# Patient Record
Sex: Male | Born: 1950
Health system: Southern US, Community
[De-identification: ages and names within clinical notes are randomized; demographics above are authoritative.]

## PROBLEM LIST (undated history)

## (undated) DIAGNOSIS — G4730 Sleep apnea, unspecified: Secondary | ICD-10-CM

## (undated) DIAGNOSIS — R001 Bradycardia, unspecified: Secondary | ICD-10-CM

## (undated) DIAGNOSIS — E785 Hyperlipidemia, unspecified: Secondary | ICD-10-CM

## (undated) DIAGNOSIS — I219 Acute myocardial infarction, unspecified: Secondary | ICD-10-CM

## (undated) DIAGNOSIS — Z720 Tobacco use: Secondary | ICD-10-CM

## (undated) DIAGNOSIS — I2510 Atherosclerotic heart disease of native coronary artery without angina pectoris: Secondary | ICD-10-CM

## (undated) DIAGNOSIS — I10 Essential (primary) hypertension: Secondary | ICD-10-CM

## (undated) DIAGNOSIS — I2111 ST elevation (STEMI) myocardial infarction involving right coronary artery: Secondary | ICD-10-CM

## (undated) DIAGNOSIS — M1990 Unspecified osteoarthritis, unspecified site: Secondary | ICD-10-CM

## (undated) DIAGNOSIS — I77810 Thoracic aortic ectasia: Secondary | ICD-10-CM

## (undated) HISTORY — PX: TONSILLECTOMY: SUR1361

## (undated) HISTORY — DX: Hyperlipidemia, unspecified: E78.5

## (undated) HISTORY — DX: Atherosclerotic heart disease of native coronary artery without angina pectoris: I25.10

## (undated) HISTORY — DX: Thoracic aortic ectasia: I77.810

## (undated) HISTORY — DX: Acute myocardial infarction, unspecified: I21.9

## (undated) HISTORY — DX: Unspecified osteoarthritis, unspecified site: M19.90

## (undated) HISTORY — DX: Bradycardia, unspecified: R00.1

---

## 1978-09-26 HISTORY — PX: OTHER SURGICAL HISTORY: SHX169

## 1981-01-29 ENCOUNTER — Encounter: Payer: Self-pay | Admitting: Gastroenterology

## 1981-01-30 ENCOUNTER — Encounter: Payer: Self-pay | Admitting: Gastroenterology

## 2009-08-12 ENCOUNTER — Encounter: Payer: Self-pay | Admitting: Internal Medicine

## 2009-11-02 ENCOUNTER — Ambulatory Visit: Payer: Self-pay | Admitting: Internal Medicine

## 2009-11-02 DIAGNOSIS — B351 Tinea unguium: Secondary | ICD-10-CM | POA: Insufficient documentation

## 2009-11-02 DIAGNOSIS — F528 Other sexual dysfunction not due to a substance or known physiological condition: Secondary | ICD-10-CM

## 2009-11-02 DIAGNOSIS — Z860100 Personal history of colon polyps, unspecified: Secondary | ICD-10-CM | POA: Insufficient documentation

## 2009-11-02 DIAGNOSIS — E039 Hypothyroidism, unspecified: Secondary | ICD-10-CM

## 2009-11-02 LAB — CONVERTED CEMR LAB
ALT: 20 units/L (ref 0–53)
AST: 20 units/L (ref 0–37)
Albumin: 4.1 g/dL (ref 3.5–5.2)
Alkaline Phosphatase: 68 units/L (ref 39–117)
Basophils Relative: 0.3 % (ref 0.0–3.0)
Bilirubin, Direct: 0.3 mg/dL (ref 0.0–0.3)
CO2: 33 meq/L — ABNORMAL HIGH (ref 19–32)
Calcium: 9 mg/dL (ref 8.4–10.5)
Chloride: 105 meq/L (ref 96–112)
Eosinophils Absolute: 0.1 10*3/uL (ref 0.0–0.7)
Eosinophils Relative: 1.4 % (ref 0.0–5.0)
Hemoglobin: 15.9 g/dL (ref 13.0–17.0)
Lymphocytes Relative: 34.7 % (ref 12.0–46.0)
MCHC: 33.8 g/dL (ref 30.0–36.0)
Neutro Abs: 4.1 10*3/uL (ref 1.4–7.7)
Neutrophils Relative %: 54.8 % (ref 43.0–77.0)
RBC: 5 M/uL (ref 4.22–5.81)
Sodium: 144 meq/L (ref 135–145)
Total Protein: 7.2 g/dL (ref 6.0–8.3)
WBC: 7.5 10*3/uL (ref 4.5–10.5)

## 2009-11-03 ENCOUNTER — Encounter: Payer: Self-pay | Admitting: Internal Medicine

## 2009-11-09 ENCOUNTER — Telehealth: Payer: Self-pay | Admitting: Internal Medicine

## 2009-11-23 ENCOUNTER — Encounter (INDEPENDENT_AMBULATORY_CARE_PROVIDER_SITE_OTHER): Payer: Self-pay | Admitting: *Deleted

## 2009-11-30 ENCOUNTER — Encounter (INDEPENDENT_AMBULATORY_CARE_PROVIDER_SITE_OTHER): Payer: Self-pay | Admitting: *Deleted

## 2009-12-21 ENCOUNTER — Encounter: Payer: Self-pay | Admitting: Internal Medicine

## 2010-01-29 ENCOUNTER — Encounter (INDEPENDENT_AMBULATORY_CARE_PROVIDER_SITE_OTHER): Payer: Self-pay | Admitting: *Deleted

## 2010-03-15 ENCOUNTER — Ambulatory Visit: Payer: Self-pay | Admitting: Gastroenterology

## 2010-04-05 ENCOUNTER — Encounter (INDEPENDENT_AMBULATORY_CARE_PROVIDER_SITE_OTHER): Payer: Self-pay | Admitting: *Deleted

## 2010-05-04 ENCOUNTER — Encounter (INDEPENDENT_AMBULATORY_CARE_PROVIDER_SITE_OTHER): Payer: Self-pay | Admitting: *Deleted

## 2010-05-05 ENCOUNTER — Ambulatory Visit: Payer: Self-pay | Admitting: Gastroenterology

## 2010-05-24 ENCOUNTER — Ambulatory Visit: Payer: Self-pay | Admitting: Gastroenterology

## 2010-05-26 ENCOUNTER — Encounter: Payer: Self-pay | Admitting: Gastroenterology

## 2010-10-26 NOTE — Letter (Signed)
Summary: Hx & Physical/[facility]  Hx & Physical/[facility]   Imported By: Sherian Rein 05/10/2010 08:01:24  _____________________________________________________________________  External Attachment:    Type:   Image     Comment:   External Document

## 2010-10-26 NOTE — Assessment & Plan Note (Signed)
Summary: discuss having colonoscopy--ch.   History of Present Illness Visit Type: Initial Visit Primary GI MD: Sheryn Bison MD FACP FAGA Primary Provider: Etta Grandchild MD Chief Complaint: Colon screening age; pateint not having any problems History of Present Illness:   60 year old Caucasian male with a history of colon polyps removed in [place] 7-8 years ago. He currently is asymptomatic and only has occasional bright red blood if he strains at a bowel movement. He has regular BMs and denies abdominal pain, upper GI, or hepatobiliary complaints. Appetite is good- weight is stable.Family history is unremarkable except for pancreatic cancer in a sister at age 68. Recent labs were reviewed and were unremarkable. His medical physician is Dr. Sanda Linger.   GI Review of Systems      Denies abdominal pain, acid reflux, belching, bloating, chest pain, dysphagia with liquids, dysphagia with solids, heartburn, loss of appetite, nausea, vomiting, vomiting blood, weight loss, and  weight gain.        Denies anal fissure, black tarry stools, change in bowel habit, constipation, diarrhea, diverticulosis, fecal incontinence, heme positive stool, hemorrhoids, irritable bowel syndrome, jaundice, light color stool, liver problems, rectal bleeding, and  rectal pain.    Current Medications (verified): 1)  Lamisil 250 Mg Tabs (Terbinafine Hcl) .... Once Daily 2)  Cialis 20 Mg Tabs (Tadalafil) .... Take As Directed  Allergies (verified): No Known Drug Allergies  Past History:  Past medical, surgical, family and social histories (including risk factors) reviewed for relevance to current acute and chronic problems.  Past Medical History: Unremarkable Colonic polyps, hx of Arthritis Sleep Apnea  Past Surgical History: Tonsillectomy patotid glandremoval  Family History: Reviewed history from 11/02/2009 and no changes required. Family History Diabetes 1st degree relative Family History of  Pancreatic Cancer:Sister  Social History: Reviewed history from 11/02/2009 and no changes required. Occupation: Environmental health practitioner Married Alcohol use-yes Drug use-no Regular exercise-yes  Review of Systems  The patient denies allergy/sinus, anemia, anxiety-new, arthritis/joint pain, back pain, blood in urine, breast changes/lumps, change in vision, confusion, cough, coughing up blood, depression-new, fainting, fatigue, fever, headaches-new, hearing problems, heart murmur, heart rhythm changes, itching, menstrual pain, muscle pains/cramps, night sweats, nosebleeds, pregnancy symptoms, shortness of breath, skin rash, sleeping problems, sore throat, swelling of feet/legs, swollen lymph glands, thirst - excessive , urination - excessive , urination changes/pain, urine leakage, vision changes, and voice change.    Vital Signs:  Patient profile:   60 year old male Height:      70 inches Weight:      217.50 pounds BMI:     31.32 Pulse rate:   80 / minute Pulse rhythm:   regular BP sitting:   120 / 80  (left arm) Cuff size:   regular  Vitals Entered By: June McMurray CMA Duncan Dull) (March 15, 2010 8:34 AM)  Physical Exam  General:  Well developed, well nourished, no acute distress.healthy appearing.   Head:  Normocephalic and atraumatic. Eyes:  PERRLA, no icterus. Neck:  Supple; no masses or thyromegaly. Lungs:  Clear throughout to auscultation.decreased BS on L and decreased BS on R.   Heart:  Regular rate and rhythm; no murmurs, rubs,  or bruits. Abdomen:  Soft, nontender and nondistended. No masses, hepatosplenomegaly or hernias noted. Normal bowel sounds. Rectal:  deferred until time of colonoscopy.   Msk:  Symmetrical with no gross deformities. Normal posture. Extremities:  No clubbing, cyanosis, edema or deformities noted.Scarring noted on the anterior shin surfaces bilaterally from chemical injury. Neurologic:  Alert  and  oriented x4;  grossly normal neurologically. Cervical  Nodes:  No significant cervical adenopathy. Psych:  Alert and cooperative. Normal mood and affect.   Impression & Recommendations:  Problem # 1:  COLONIC POLYPS, HX OF (ICD-V12.72) Assessment Unchanged Colonoscopy scheduled at his convenience with conventional balanced electrolyte solution preparation. Negative family history except for pancreatic carcinoma in his sister. However, family history is somewhat equivocal.  Problem # 2:  ONYCHOMYCOSIS, TOENAILS (ICD-110.1) Assessment: Comment Only  Patient Instructions: 1)  Please call back to schedule a colonoscopy at your convience. 2)  Please continue current medications.  3)  The medication list was reviewed and reconciled.  All changed / newly prescribed medications were explained.  A complete medication list was provided to the patient / caregiver. 4)  Colonoscopy and Flexible Sigmoidoscopy brochure given.  5)  Conscious Sedation brochure given.  6)  Copy sent to : Dr. Sanda Linger.

## 2010-10-26 NOTE — Letter (Signed)
Summary: Patient Notice- Polyp Results  [facility]  [address] [place], [place] [address]   Phone: [phone]  Fax: [phone]        May 26, 2010 MRN: [id number]    Gregory Hale [address] [place], [place]  [address]    Dear [name],  I am pleased to inform you that the colon polyp(s) removed during your recent colonoscopy was (were) found to be benign (no cancer detected) upon pathologic examination.  I recommend you have a repeat colonoscopy examination in 10_ years to look for recurrent polyps, as having colon polyps increases your risk for having recurrent polyps or even colon cancer in the future.  Should you develop new or worsening symptoms of abdominal pain, bowel habit changes or bleeding from the rectum or bowels, please schedule an evaluation with either your primary care physician or with me.  Additional information/recommendations:  _X_ No further action with gastroenterology is needed at this time. Please      follow-up with your primary care physician for your other healthcare      needs.  __ Please call [phone] to schedule a return visit to review your      situation.  __ Please keep your follow-up visit as already scheduled.  __ Continue treatment plan as outlined the day of your exam.  Please call us if you are having persistent problems or have questions about your condition that have not been fully answered at this time.  Sincerely,  Mardella Layman MD [facility]  This letter has been electronically signed by your physician.  Appended Document: Patient Notice- Polyp Results letter mailed 9.2.11

## 2010-10-26 NOTE — Progress Notes (Signed)
Summary: Thyroid  Phone Note Call from Patient Call back at Home Phone [phone]   Summary of Call: Pt was started on thyroid medication but labs came back normal. Should he continue medication?  Initial call taken by: Lamar Sprinkles, CMA,  November 09, 2009 10:30 AM  Follow-up for Phone Call        no, he does not need it Follow-up by: Etta Grandchild MD,  November 10, 2009 7:42 AM  Additional Follow-up for Phone Call Additional follow up Details #1::        Patient notified and states that Cialis is not doing everything he needs it to and would like to know what to do next/what do you recommend? (he was under the impression that this was side effect of thyroid issue) Please advise. Additional Follow-up by: Lucious Groves,  November 10, 2009 9:46 AM    Additional Follow-up for Phone Call Additional follow up Details #2::    i will refer him to urology Follow-up by: Etta Grandchild MD,  November 10, 2009 9:55 AM  Additional Follow-up for Phone Call Additional follow up Details #3:: Details for Additional Follow-up Action Taken: Patient notified and will await call from [facility]. Additional Follow-up by: Lucious Groves,  November 10, 2009 11:46 AM

## 2010-10-26 NOTE — Assessment & Plan Note (Signed)
Summary: NEW PT,BCBS,#,LB   Vital Signs:  Patient profile:   60 year old male Height:      70 inches Weight:      220 pounds BMI:     31.68 O2 Sat:      98 % on Room air Temp:     97.1 degrees F oral Pulse rate:   60 / minute Pulse rhythm:   regular Resp:     16 per minute BP sitting:   140 / 86  (left arm) Cuff size:   large  Vitals Entered By: Rock Nephew CMA (November 02, 2009 1:21 PM)  Nutrition Counseling: Patient's BMI is greater than 25 and therefore counseled on weight management options.  O2 Flow:  Room air  Primary Care Provider:  Etta Grandchild MD   History of Present Illness: New to me with complaints 1. ED that has not improved much despite trying Cialis and Viagra 2. He had a complete physical on 08/12/09 and TSH was high at 6.03, but this was not treated 3. He has painful toenail fungus on both sides, R>L, and nearly all toes are  involed.  Preventive Screening-Counseling & Management  Alcohol-Tobacco     Alcohol drinks/day: <1     Alcohol type: beer     >5/day in last 3 mos: no     Alcohol Counseling: not indicated; use of alcohol is not excessive or problematic     Feels need to cut down: no     Feels annoyed by complaints: no     Feels guilty re: drinking: no     Needs 'eye opener' in am: no     Smoking Status: quit > 6 months     Year Quit: 1987  Caffeine-Diet-Exercise     Does Patient Exercise: yes  Hep-HIV-STD-Contraception     Hepatitis Risk: no risk noted     HIV Risk: no risk noted     STD Risk: no risk noted  Safety-Violence-Falls     Seat Belt Use: yes     Helmet Use: yes     Firearms in the Home: no firearms in the home     Smoke Detectors: yes     Violence in the Home: no risk noted     Sexual Abuse: no      Sexual History:  currently monogamous.        Drug Use:  no.        Blood Transfusions:  no.    Medications Prior to Update: 1)  None  Current Medications (verified): 1)  None  Allergies (verified): No  Known Drug Allergies  Past History:  Past Medical History: Unremarkable  Past Surgical History: Tonsillectomy  Family History: Family History Diabetes 1st degree relative  Social History: Occupation: Environmental health practitioner Married Alcohol use-yes Drug use-no Regular exercise-yes Smoking Status:  quit > 6 months Hepatitis Risk:  no risk noted HIV Risk:  no risk noted STD Risk:  no risk noted Seat Belt Use:  yes Sexual History:  currently monogamous Blood Transfusions:  no Drug Use:  no Does Patient Exercise:  yes  Review of Systems       The patient complains of weight gain.  The patient denies anorexia, fever, weight loss, chest pain, syncope, dyspnea on exertion, peripheral edema, prolonged cough, headaches, hemoptysis, abdominal pain, hematuria, incontinence, suspicious skin lesions, and angioedema.   GI:  Denies bloody stools, change in bowel habits, loss of appetite, nausea, vomiting, and yellowish skin color. GU:  Complains of erectile dysfunction; denies decreased libido, discharge, dysuria, hematuria, incontinence, nocturia, urinary frequency, and urinary hesitancy. Endo:  Complains of weight change; denies cold intolerance, excessive hunger, excessive thirst, excessive urination, heat intolerance, and polyuria.  Physical Exam  General:  alert, well-developed, well-nourished, well-hydrated, appropriate dress, normal appearance, and overweight-appearing.   Head:  normocephalic, atraumatic, no abnormalities observed, and no abnormalities palpated.   Mouth:  Oral mucosa and oropharynx without lesions or exudates.  Teeth in good repair. Neck:  supple, full ROM, no masses, no thyromegaly, no thyroid nodules or tenderness, no JVD, normal carotid upstroke, no carotid bruits, no cervical lymphadenopathy, and no neck tenderness.   Lungs:  Normal respiratory effort, chest expands symmetrically. Lungs are clear to auscultation, no crackles or wheezes. Heart:  Normal rate and  regular rhythm. S1 and S2 normal without gallop, murmur, click, rub or other extra sounds. Abdomen:  soft, non-tender, normal bowel sounds, no distention, no masses, no guarding, no rigidity, no hepatomegaly, and no splenomegaly.   Msk:  No deformity or scoliosis noted of thoracic or lumbar spine.   Pulses:  R and L carotid,radial,femoral,dorsalis pedis and posterior tibial pulses are full and equal bilaterally Extremities:  No clubbing, cyanosis, edema, or deformity noted with normal full range of motion of all joints.   Neurologic:  No cranial nerve deficits noted. Station and gait are normal. Plantar reflexes are down-going bilaterally. DTRs are symmetrical throughout. Sensory, motor and coordinative functions appear intact. Skin:  Intact without suspicious lesions or rashes. Toenails are deformed with subung. debris, thickening, and lysis, all are involved to some degree Cervical Nodes:  no anterior cervical adenopathy and no posterior cervical adenopathy.   Axillary Nodes:  no R axillary adenopathy and no L axillary adenopathy.   Psych:  Cognition and judgment appear intact. Alert and cooperative with normal attention span and concentration. No apparent delusions, illusions, hallucinations   Impression & Recommendations:  Problem # 1:  ERECTILE DYSFUNCTION (ICD-302.72) Assessment New  I am concerned that Hypothyroidism and/or low testosterone may be the culprit here His updated medication list for this problem includes:    Cialis 20 Mg Tabs (Tadalafil) .Marland Kitchen... Take as directed  Orders: TLB-BMP (Basic Metabolic Panel-BMET) (80048-METABOL) TLB-CBC Platelet - w/Differential (85025-CBCD) TLB-Hepatic/Liver Function Pnl (80076-HEPATIC) TLB-TSH (Thyroid Stimulating Hormone) (84443-TSH) TLB-Testosterone, Total (84403-TESTO)  Discussed proper use of medications, as well as side effects.   Problem # 2:  UNSPECIFIED HYPOTHYROIDISM (ICD-244.9) Assessment: New  His updated medication list for  this problem includes:    Synthroid 50 Mcg Tabs (Levothyroxine sodium) ..... One by mouth once daily for thyroid  Orders: TLB-BMP (Basic Metabolic Panel-BMET) (80048-METABOL) TLB-CBC Platelet - w/Differential (85025-CBCD) TLB-Hepatic/Liver Function Pnl (80076-HEPATIC) TLB-TSH (Thyroid Stimulating Hormone) (84443-TSH) TLB-Testosterone, Total (84403-TESTO)  Problem # 3:  ONYCHOMYCOSIS, TOENAILS (ICD-110.1) Assessment: New  His updated medication list for this problem includes:    Lamisil 250 Mg Tabs (Terbinafine hcl) ..... Once daily  Orders: TLB-BMP (Basic Metabolic Panel-BMET) (80048-METABOL) TLB-CBC Platelet - w/Differential (85025-CBCD) TLB-Hepatic/Liver Function Pnl (80076-HEPATIC) TLB-TSH (Thyroid Stimulating Hormone) (84443-TSH) TLB-Testosterone, Total (84403-TESTO)  Discussed nail care and medication treatment options.   Complete Medication List: 1)  Lamisil 250 Mg Tabs (Terbinafine hcl) .... Once daily 2)  Cialis 20 Mg Tabs (Tadalafil) .... Take as directed 3)  Synthroid 50 Mcg Tabs (Levothyroxine sodium) .... One by mouth once daily for thyroid  Patient Instructions: 1)  Please schedule a follow-up appointment in 1 month. 2)  It is important that you exercise regularly at least 20  minutes 5 times a week. If you develop chest pain, have severe difficulty breathing, or feel very tired , stop exercising immediately and seek medical attention. 3)  You need to lose weight. Consider a lower calorie diet and regular exercise.  Prescriptions: SYNTHROID 50 MCG TABS (LEVOTHYROXINE SODIUM) One by mouth once daily for thyroid  #30 x 11   Entered and Authorized by:   Etta Grandchild MD   Signed by:   Etta Grandchild MD on 11/02/2009   Method used:   Print then Give to Patient   RxID:   [id number] CIALIS 20 MG TABS (TADALAFIL) take as directed  #3 x 11   Entered and Authorized by:   Etta Grandchild MD   Signed by:   Etta Grandchild MD on 11/02/2009   Method used:   Print  then Give to Patient   RxID:   [id number] LAMISIL 250 MG TABS (TERBINAFINE HCL) once daily  #30 x 4   Entered and Authorized by:   Etta Grandchild MD   Signed by:   Etta Grandchild MD on 11/02/2009   Method used:   Print then Give to Patient   RxID:   [id number]   Preventive Care Screening  Last Tetanus Booster:    Date:  09/26/2008    Results:  Historical     Not Administered:    Influenza Vaccine not given due to: declined   Appended Document: NEW PT,BCBS,#,LB    Past History:  Past Medical History: Unremarkable Colonic polyps, hx of    Colorectal Screening:  Colonoscopy Results:    Date of Exam: 12/03/2003    Results: Adenomatous Polyp  Colonoscopy Comments:    Dr Dawna Part in [place] ph [phone] fax [phone]  Immunization & Chemoprophylaxis:    Tetanus vaccine: Historical  (09/26/2008)

## 2010-10-26 NOTE — Miscellaneous (Signed)
Summary: [facility]  Clinical Lists Changes  Medications: Added new medication of MOVIPREP 100 GM  SOLR (PEG-KCL-NACL-NASULF-NA ASC-C) As per prep instructions. - Signed Rx of MOVIPREP 100 GM  SOLR (PEG-KCL-NACL-NASULF-NA ASC-C) As per prep instructions.;  #1 x 0;  Signed;  Entered by: Ezra Sites RN;  Authorized by: Mardella Layman MD [facility];  Method used: [facility] [phone]*, [address], [place], [place]  [address], Ph: [phone] or [phone], Fax: [phone] Observations: Added new observation of NKA: T (05/05/2010 13:18)    Prescriptions: MOVIPREP 100 GM  SOLR (PEG-KCL-NACL-NASULF-NA ASC-C) As per prep instructions.  #1 x 0   Entered by:   Ezra Sites RN   Authorized by:   Mardella Layman MD [facility]   Signed by:   Ezra Sites RN on 05/05/2010   Method used:   [facility]        [facility] [phone][facility] (retail)       [address]       [place], [place]  [address]       Ph: [phone] or [phone]       Fax: [phone]   RxID:   [id number]

## 2010-10-26 NOTE — Op Note (Signed)
Summary: [facility] [place]   Imported By: Sherian Rein 05/10/2010 07:59:59  _____________________________________________________________________  External Attachment:    Type:   Image     Comment:   External Document

## 2010-10-26 NOTE — Letter (Signed)
Summary: [facility] Instructions  [facility]  [address] [place], [place] [address]   Phone: [phone]  Fax: [phone]       Gregory Hale    09-Dec-1950    MRN: [id number]        Procedure Day Dorna Bloom:  Duanne Limerick  05/24/10     Arrival Time:  7:30am     Procedure Time: 8:30am     Location of Procedure:                    Juliann Pares  [facility] (4th Floor)                        PREPARATION FOR COLONOSCOPY WITH MOVIPREP   Starting 5 days prior to your procedure  [facility] 08/24  do not eat nuts, seeds, popcorn, corn, beans, peas,  salads, or any raw vegetables.  Do not take any fiber supplements (e.g. Metamucil, Citrucel, and Benefiber).  THE DAY BEFORE YOUR PROCEDURE         DATE:  SUNDAY  08/28  1.  Drink clear liquids the entire day-NO SOLID FOOD  2.  Do not drink anything colored red or purple.  Avoid juices with pulp.  No orange juice.  3.  Drink at least 64 oz. (8 glasses) of fluid/clear liquids during the day to prevent dehydration and help the prep work efficiently.  CLEAR LIQUIDS INCLUDE: Water Jello Ice Popsicles Tea (sugar ok, no milk/cream) Powdered fruit flavored drinks Coffee (sugar ok, no milk/cream) Gatorade Juice: apple, white grape, white cranberry  Lemonade Clear bullion, consomm, broth Carbonated beverages (any kind) Strained chicken noodle soup Hard Candy                             4.  In the morning, mix first dose of MoviPrep solution:    Empty 1 Pouch A and 1 Pouch B into the disposable container    Add lukewarm drinking water to the top line of the container. Mix to dissolve    Refrigerate (mixed solution should be used within 24 hrs)  5.  Begin drinking the prep at 5:00 p.m. The MoviPrep container is divided by 4 marks.   Every 15 minutes drink the solution down to the next mark (approximately 8 oz) until the full liter is complete.   6.  Follow completed prep with 16 oz of clear liquid of your choice  (Nothing red or purple).  Continue to drink clear liquids until bedtime.  7.  Before going to bed, mix second dose of MoviPrep solution:    Empty 1 Pouch A and 1 Pouch B into the disposable container    Add lukewarm drinking water to the top line of the container. Mix to dissolve    Refrigerate  THE DAY OF YOUR PROCEDURE      DATE:  MONDAY  08/29  Beginning at  3:30 a.m. (5 hours before procedure):         1. Every 15 minutes, drink the solution down to the next mark (approx 8 oz) until the full liter is complete.  2. Follow completed prep with 16 oz. of clear liquid of your choice.    3. You may drink clear liquids until 6:30am  (2 HOURS BEFORE PROCEDURE).   MEDICATION INSTRUCTIONS  Unless otherwise instructed, you should take regular prescription medications with a small sip of water   as  early as possible the morning of your procedure.           OTHER INSTRUCTIONS  You will need a responsible adult at least 59 years of age to accompany you and drive you home.   This person must remain in the waiting room during your procedure.  Wear loose fitting clothing that is easily removed.  Leave jewelry and other valuables at home.  However, you may wish to bring a book to read or  an iPod/MP3 player to listen to music as you wait for your procedure to start.  Remove all body piercing jewelry and leave at home.  Total time from sign-in until discharge is approximately 2-3 hours.  You should go home directly after your procedure and rest.  You can resume normal activities the  day after your procedure.  The day of your procedure you should not:   Drive   Make legal decisions   Operate machinery   Drink alcohol   Return to work  You will receive specific instructions about eating, activities and medications before you leave.    The above instructions have been reviewed and explained to me by   Ezra Sites RN  May 05, 2010 1:37 PM    I fully understand  and can verbalize these instructions _____________________________ Date _________

## 2010-10-26 NOTE — Letter (Signed)
Summary: New Patient letter  [facility]  [address] [place], [place] [address]   Phone: [phone]  Fax: [phone]       11/30/2009 MRN: [id number]  Gregory Hale [address] [place], [place]  [address]  Dear Mr. Gregory Hale,  Welcome to the Gastroenterology Division at [facility].    You are scheduled to see Dr. Jarold Motto on 12-25-09 at 10:00a.m. on the 3rd floor at [facility], [address].  We ask that you try to arrive at our office 15 minutes prior to your appointment time to allow for check-in.  We would like you to complete the enclosed self-administered evaluation form prior to your visit and bring it with you on the day of your appointment.  We will review it with you.  Also, please bring a complete list of all your medications or, if you prefer, bring the medication bottles and we will list them.  Please bring your insurance card so that we may make a copy of it.  If your insurance requires a referral to see a specialist, please bring your referral form from your primary care physician.  Co-payments are due at the time of your visit and may be paid by cash, check or credit card.     Your office visit will consist of a consult with your physician (includes a physical exam), any laboratory testing he/she may order, scheduling of any necessary diagnostic testing (e.g. x-ray, ultrasound, CT-scan), and scheduling of a procedure (e.g. Endoscopy, Colonoscopy) if required.  Please allow enough time on your schedule to allow for any/all of these possibilities.    If you cannot keep your appointment, please call [phone] to cancel or reschedule prior to your appointment date.  This allows [place] the opportunity to schedule an appointment for another patient in need of care.  If you do not cancel or reschedule by 5 p.m. the business day prior to your appointment date, you will be charged a $50.00 late cancellation/no-show fee.    Thank you for  choosing [facility] for your medical needs.  We appreciate the opportunity to care for you.  Please visit [place] at our website  to learn more about our practice.                     Sincerely,                                                             The Gastroenterology Division

## 2010-10-26 NOTE — Letter (Signed)
Summary: Previsit letter  [facility]  [address] [place], [place] [address]   Phone: [phone]  Fax: [phone]       04/05/2010 MRN: [id number]  Gregory Hale [address] [place], [place]  [address]  Dear Mr. DROKE,  Welcome to the Gastroenterology Division at [facility].    You are scheduled to see a nurse for your pre-procedure visit on 05-10-10 at 8:30am on the 3rd floor at [facility], [address].  We ask that you try to arrive at our office 15 minutes prior to your appointment time to allow for check-in.  Your nurse visit will consist of discussing your medical and surgical history, your immediate family medical history, and your medications.    Please bring a complete list of all your medications or, if you prefer, bring the medication bottles and we will list them.  We will need to be aware of both prescribed and over the counter drugs.  We will need to know exact dosage information as well.  If you are on blood thinners (Coumadin, Plavix, Aggrenox, Ticlid, etc.) please call our office today/prior to your appointment, as we need to consult with your physician about holding your medication.   Please be prepared to read and sign documents such as consent forms, a financial agreement, and acknowledgement forms.  If necessary, and with your consent, a friend or relative is welcome to sit-in on the nurse visit with you.  Please bring your insurance card so that we may make a copy of it.  If your insurance requires a referral to see a specialist, please bring your referral form from your primary care physician.  No co-pay is required for this nurse visit.     If you cannot keep your appointment, please call [phone] to cancel or reschedule prior to your appointment date.  This allows [place] the opportunity to schedule an appointment for another patient in need of care.    Thank you for choosing [facility] for your medical  needs.  We appreciate the opportunity to care for you.  Please visit [place] at our website  to learn more about our practice.                     Sincerely.                                                                                                                   The Gastroenterology Division

## 2010-10-26 NOTE — Letter (Signed)
Summary: [facility]  [facility]   Imported By: Lester Deepwater 12/28/2009 07:58:40  _____________________________________________________________________  External Attachment:    Type:   Image     Comment:   External Document

## 2010-10-26 NOTE — Procedures (Signed)
Summary: Colonoscopy  Patient: Gregory Hale Note: All result statuses are Final unless otherwise noted.  Tests: (1) Colonoscopy (COL)   COL Colonoscopy           DONE     [facility]     [address]     [place], [place]  [address]           COLONOSCOPY PROCEDURE REPORT           PATIENT:  Gregory Hale, Gregory Hale  MR#:  [id number]     BIRTHDATE:  02/02/1951, 59 yrs. old  GENDER:  male     ENDOSCOPIST:  Vania Rea. Jarold Motto, MD, [facility]     REF. BY:     PROCEDURE DATE:  05/24/2010     PROCEDURE:  Colonoscopy with biopsy     ASA CLASS:  Class II     INDICATIONS:  history of polyps     MEDICATIONS:   Fentanyl 50 mcg IV, Versed 5 mg IV           DESCRIPTION OF PROCEDURE:   After the risks benefits and     alternatives of the procedure were thoroughly explained, informed     consent was obtained.  Digital rectal exam was performed and     revealed no abnormalities.   The LB CF-[id number] endoscope     was introduced through the anus and advanced to the cecum, which     was identified by both the appendix and ileocecal valve, without     limitations.  The quality of the prep was excellent, using     MoviPrep.  The instrument was then slowly withdrawn as the colon     was fully examined.     <<PROCEDUREIMAGES>>           FINDINGS:  ENDOSCOPIC FINDINGS:  There were multiple polyps     identified and removed. in the rectum and sigmoid colon. -2 mm     flat nodules cold BX. REMOVED. This was otherwise a normal     examination of the colon.   Retroflexed views in the rectum     revealed hypertrophied anal papillae.    The scope was then     withdrawn from the patient and the procedure completed.           COMPLICATIONS:  None     ENDOSCOPIC IMPRESSION:     1) Polyps, multiple in the rectum and sigmoid colon     2) Otherwise normal examination     3) Hypertrophied anal papillae     PROBAQBLE HYPERPLASTIC POLYPS.     RECOMMENDATIONS:     1) Repeat colonoscopy in 5 years if polyp  adenomatous; otherwise     10 years     REPEAT EXAM:  No           ______________________________     Vania Rea. Jarold Motto, MD, [facility] Graham           CC:  Etta Grandchild, MD           n.     Rosalie DoctorMarland Kitchen   Vania Rea. Patterson at 05/24/2010 08:57 AM           Harlan Stains, [id number]  Note: An exclamation mark (!) indicates a result that was not dispersed into the flowsheet. Document Creation Date: 05/24/2010 8:59 AM _______________________________________________________________________  (1) Order result status: Final Collection or observation date-time: 05/24/2010 08:49 Requested date-time:  Receipt date-time:  Reported date-time:  Referring Physician:  Ordering Physician: Sheryn Bison [phone]) Specimen Source:  Source: Launa Grill Order Number: [id number] Lab site:   Appended Document: Colonoscopy     Procedures Next Due Date:    Colonoscopy: 05/2020

## 2010-10-26 NOTE — Letter (Signed)
Summary: [facility] Consult Scheduled Letter  [facility]  [address] [place], [place] [address]   Phone: [phone]  Fax: [phone]      11/23/2009 MRN: [id number]  WARD BOISSONNEAULT [address] [place], [place]  [address]    Dear Mr. BREEZE,      We have scheduled an appointment for you.  At the recommendation of Dr.Jones, we have scheduled you a consult with Dr Halina Andreas on 12/15/09 at 8:30am.  Their phone number is [phone].  If this appointment day and time is not convenient for you, please feel free to call the office of the doctor you are being referred to at the number listed above and reschedule the appointment.    [facility] [address] [place], [place] [address]    Thank you,  Patient Care Coordinator [facility]

## 2010-10-26 NOTE — Letter (Signed)
Summary: Results Follow-up Letter  [facility]  [address] [place], [place] [address]   Phone: [phone]  Fax: [phone]    11/03/2009  [address] [place], [place]  [address]  Dear Gregory Hale,   The following are the results of your recent test(s):  Test     Result     Testosterone   normal Thyroid     normal Liver/kidney   normal CBC       normal   _________________________________________________________  Please call for an appointment in 1-2 months _________________________________________________________ _________________________________________________________ _________________________________________________________  Sincerely,  Sanda Linger MD [facility]

## 2010-10-26 NOTE — Letter (Signed)
Summary: New Patient letter  [facility]  [address] [place], [place] [address]   Phone: [phone]  Fax: [phone]       01/29/2010 MRN: [id number]  Gregory Hale [address] [place], [place]  [address]  Dear [name],  Welcome to the Gastroenterology Division at [facility].    You are scheduled to see Dr.  Jarold Motto on 03-15-10 at 8:30a.m. on the 3rd floor at [facility], [address].  We ask that you try to arrive at our office 15 minutes prior to your appointment time to allow for check-in.  We would like you to complete the enclosed self-administered evaluation form prior to your visit and bring it with you on the day of your appointment.  We will review it with you.  Also, please bring a complete list of all your medications or, if you prefer, bring the medication bottles and we will list them.  Please bring your insurance card so that we may make a copy of it.  If your insurance requires a referral to see a specialist, please bring your referral form from your primary care physician.  Co-payments are due at the time of your visit and may be paid by cash, check or credit card.     Your office visit will consist of a consult with your physician (includes a physical exam), any laboratory testing he/she may order, scheduling of any necessary diagnostic testing (e.g. x-ray, ultrasound, CT-scan), and scheduling of a procedure (e.g. Endoscopy, Colonoscopy) if required.  Please allow enough time on your schedule to allow for any/all of these possibilities.    If you cannot keep your appointment, please call [phone] to cancel or reschedule prior to your appointment date.  This allows [place] the opportunity to schedule an appointment for another patient in need of care.  If you do not cancel or reschedule by 5 p.m. the business day prior to your appointment date, you will be charged a $50.00 late cancellation/no-show fee.    Thank you for  choosing [facility] for your medical needs.  We appreciate the opportunity to care for you.  Please visit [place] at our website  to learn more about our practice.                     Sincerely,                                                             The Gastroenterology Division

## 2012-06-14 ENCOUNTER — Encounter: Payer: Self-pay | Admitting: Internal Medicine

## 2012-06-14 LAB — COMPLETE METABOLIC PANEL WITH GFR
AST: 17 U/L
Albumin/Globulin Ratio: 1.8
Alkaline Phosphatase: 64 U/L
BUN: 19 mg/dL (ref 4–21)
CRP: 1.1 mg/dL
Calcium: 9.5 mg/dL
Chloride: 103 mmol/L
GGT: 26
Glucose: 106
HCT: 46 %
HDL: 54 mg/dL (ref 35–70)
Hgb A1c MFr Bld: 5.4 % (ref 4.0–6.0)
Iron: 131
LDH (Lactate Dehydrogenase)-Urine: 134
MCV: 92 fL (ref 80–98)
PSA: 0.4
Phosphorus: 3 mg/dL (ref 2.5–4.9)
Platelets: 154
Potassium: 5.2 mmol/L
RBC: 5.04
RDW: 13.4
Sodium: 142 mmol/L (ref 137–147)
TSH: 4.35
Total CHOL/HDL Ratio: 3.3
Total Protein: 6.5 g/dL
WBC: 6.1

## 2013-03-15 ENCOUNTER — Encounter: Payer: Self-pay | Admitting: Internal Medicine

## 2013-04-05 ENCOUNTER — Encounter: Payer: Self-pay | Admitting: Internal Medicine

## 2013-04-19 ENCOUNTER — Ambulatory Visit (INDEPENDENT_AMBULATORY_CARE_PROVIDER_SITE_OTHER): Payer: Self-pay | Admitting: Internal Medicine

## 2013-04-19 ENCOUNTER — Encounter: Payer: Self-pay | Admitting: Internal Medicine

## 2013-04-19 VITALS — BP 150/90 | HR 63 | Temp 98.7°F | Resp 20 | Ht 70.0 in | Wt 210.0 lb

## 2013-04-19 DIAGNOSIS — Z860100 Personal history of colon polyps, unspecified: Secondary | ICD-10-CM

## 2013-04-19 DIAGNOSIS — E039 Hypothyroidism, unspecified: Secondary | ICD-10-CM

## 2013-04-19 DIAGNOSIS — Z0000 Encounter for general adult medical examination without abnormal findings: Secondary | ICD-10-CM

## 2013-04-19 DIAGNOSIS — H9193 Unspecified hearing loss, bilateral: Secondary | ICD-10-CM

## 2013-04-19 DIAGNOSIS — H9190 Unspecified hearing loss, unspecified ear: Secondary | ICD-10-CM

## 2013-04-19 MED ORDER — SILDENAFIL CITRATE 100 MG PO TABS: 100.0000 mg | ORAL_TABLET | Freq: Every day | ORAL | Status: DC | PRN

## 2013-04-19 NOTE — Progress Notes (Signed)
Subjective:    Patient ID: Gregory Hale, male    DOB: 08/16/1951, 62 y.o.   MRN: [id number]  HPI  62 year old patient who is seen today to establish with our practice.  Medical problems include borderline hypertension.  He also has occasional episodes of sciatica with pain lasting a few weeks. Was also told that he has spinal stenosis he does have occasional numbness in the legs with ambulation.  He is quite active the with activities such as golf without difficulties. He walks and carries his own clubs.  He did have a colonoscopy in 2011 pathology revealed hyperplastic polyps  Social history married 48 year old son has been in [place] resident since 2009 relocated from [place]. 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 [employer] of [employer] former smoker but discontinued 20 years ago  Family history father died when the patient was age 62 do to rheumatic heart disease mother died age 50 of unclear cardiac conditions 3 brothers 4 sisters one sister died of pancreatic cancer positive for diabetes and hypertension     Past Medical History  Diagnosis Date  . Arthritis     History   Social History  . Marital Status: Married    Spouse Name: N/A    Number of Children: N/A  . Years of Education: N/A   Occupational History  . Not on file.   Social History Main Topics  . Smoking status: Former Games developer  . Smokeless tobacco: Never Used  . Alcohol Use: 1.2 - 1.8 oz/week    2-3 Cans of beer per week  . Drug Use: No  . Sexually Active: Not on file   Other Topics Concern  . Not on file   Social History Narrative  . No narrative on file    Past Surgical History  Procedure Laterality Date  . Parotid Left 1980  . Tonsillectomy      as a child    No family history on file.  No Known Allergies  No current outpatient prescriptions on file prior to visit.   No current facility-administered medications on file prior to visit.    BP  150/90  Pulse 63  Temp(Src) 98.7 F (37.1 C) (Oral)  Resp 20  Ht 5\' 10"  (1.778 m)  Wt 210 lb (95.255 kg)  BMI 30.13 kg/m2  SpO2 98%      Review of Systems  Constitutional: Negative for fever, chills, activity change, appetite change and fatigue.  HENT: Negative for hearing loss, ear pain, congestion, rhinorrhea, sneezing, mouth sores, trouble swallowing, neck pain, neck stiffness, dental problem, voice change, sinus pressure and tinnitus.   Eyes: Negative for photophobia, pain, redness and visual disturbance.  Respiratory: Negative for apnea, cough, choking, chest tightness, shortness of breath and wheezing.   Cardiovascular: Negative for chest pain, palpitations and leg swelling.  Gastrointestinal: Negative for nausea, vomiting, abdominal pain, diarrhea, constipation, blood in stool, abdominal distention, anal bleeding and rectal pain.  Genitourinary: Negative for dysuria, urgency, frequency, hematuria, flank pain, decreased urine volume, discharge, penile swelling, scrotal swelling, difficulty urinating, genital sores and testicular pain.  Musculoskeletal: Positive for back pain. Negative for myalgias, joint swelling, arthralgias and gait problem.       Rare episodes  of significant sciatica  Skin: Negative for color change, rash and wound.  Neurological: Negative for dizziness, tremors, seizures, syncope, facial asymmetry, speech difficulty, weakness, light-headedness, numbness and headaches.  Hematological: Negative for adenopathy. Does not bruise/bleed easily.  Psychiatric/Behavioral: Negative for suicidal ideas, hallucinations, behavioral  problems, confusion, sleep disturbance, self-injury, dysphoric mood, decreased concentration and agitation. The patient is not nervous/anxious.        Objective:   Physical Exam  Constitutional: He appears well-developed and well-nourished.  160/90  HENT:  Head: Normocephalic and atraumatic.  Right Ear: External ear normal.  Left Ear:  External ear normal.  Nose: Nose normal.  Mouth/Throat: Oropharynx is clear and moist.  Eyes: Conjunctivae and EOM are normal. Pupils are equal, round, and reactive to light. No scleral icterus.  Neck: Normal range of motion. Neck supple. No JVD present. No thyromegaly present.  Cardiovascular: Regular rhythm, normal heart sounds and intact distal pulses.  Exam reveals no gallop and no friction rub.   No murmur heard. Pulmonary/Chest: Effort normal and breath sounds normal. He exhibits no tenderness.  Abdominal: Soft. Bowel sounds are normal. He exhibits no distension and no mass. There is no tenderness.  Genitourinary: Prostate normal and penis normal. Guaiac negative stool.  Few external hemorrhoids  Musculoskeletal: Normal range of motion. He exhibits no edema and no tenderness.  Lymphadenopathy:    He has no cervical adenopathy.  Neurological: He is alert. He has normal reflexes. No cranial nerve deficit. Coordination normal.  Skin: Skin is warm and dry. No rash noted.  Psychiatric: He has a normal mood and affect. His behavior is normal.          Assessment & Plan:   Preventive health exam Rule out hypertension History of hyperplastic polyps History of sciatica History mild spinal stenosis  Modest weight loss encouraged Home blood pressure monitoring encouraged Low-salt diet recommended  Recheck 1 year Lab update at work in September to be reviewed

## 2013-04-19 NOTE — Patient Instructions (Addendum)
Limit your sodium (Salt) intake    It is important that you exercise regularly, at least 20 minutes 3 to 4 times per week.  If you develop chest pain or shortness of breath seek  medical attention.  You need to lose weight.  Consider a lower calorie diet and regular exercise.  Please check your blood pressure on a regular basis.  If it is consistently greater than 140/90, please make an office appointment.   DASH Diet The DASH diet stands for "Dietary Approaches to Stop Hypertension." It is a healthy eating plan that has been shown to reduce high blood pressure (hypertension) in as little as 14 days, while also possibly providing other significant health benefits. These other health benefits include reducing the risk of breast cancer after menopause and reducing the risk of type 2 diabetes, heart disease, colon cancer, and stroke. Health benefits also include weight loss and slowing kidney failure in patients with chronic kidney disease.  DIET GUIDELINES  Limit salt (sodium). Your diet should contain less than 1500 mg of sodium daily.  Limit refined or processed carbohydrates. Your diet should include mostly whole grains. Desserts and added sugars should be used sparingly.  Include small amounts of heart-healthy fats. These types of fats include nuts, oils, and tub margarine. Limit saturated and trans fats. These fats have been shown to be harmful in the body. CHOOSING FOODS  The following food groups are based on a 2000 calorie diet. See your Registered Dietitian for individual calorie needs. Grains and Grain Products (6 to 8 servings daily)  Eat More Often: Whole-wheat bread, brown rice, whole-grain or wheat pasta, quinoa, popcorn without added fat or salt (air popped).  Eat Less Often: White bread, white pasta, white rice, cornbread. Vegetables (4 to 5 servings daily)  Eat More Often: Fresh, frozen, and canned vegetables. Vegetables may be raw, steamed, roasted, or grilled with a  minimal amount of fat.  Eat Less Often/Avoid: Creamed or fried vegetables. Vegetables in a cheese sauce. Fruit (4 to 5 servings daily)  Eat More Often: All fresh, canned (in natural juice), or frozen fruits. Dried fruits without added sugar. One hundred percent fruit juice ( cup [237 mL] daily).  Eat Less Often: Dried fruits with added sugar. Canned fruit in light or heavy syrup. [employer], Fish, and Poultry (2 servings or less daily. One serving is 3 to 4 oz [85-114 g]).  Eat More Often: Ninety percent or leaner ground beef, tenderloin, sirloin. Round cuts of beef, chicken breast, [place] breast. All fish. Grill, bake, or broil your meat. Nothing should be fried.  Eat Less Often/Avoid: Fatty cuts of meat, [place], or chicken leg, thigh, or wing. Fried cuts of meat or fish. Dairy (2 to 3 servings)  Eat More Often: Low-fat or fat-free milk, low-fat plain or light yogurt, reduced-fat or part-skim cheese.  Eat Less Often/Avoid: Milk (whole, 2%).Whole milk yogurt. Full-fat cheeses. Nuts, Seeds, and Legumes (4 to 5 servings per week)  Eat More Often: All without added salt.  Eat Less Often/Avoid: Salted nuts and seeds, canned beans with added salt. Fats and Sweets (limited)  Eat More Often: Vegetable oils, tub margarines without trans fats, sugar-free gelatin. Mayonnaise and salad dressings.  Eat Less Often/Avoid: Coconut oils, palm oils, butter, stick margarine, cream, half and half, cookies, candy, pie. FOR MORE INFORMATION The Dash Diet Eating Plan: www.dashdiet.org Document Released: 09/01/2011 Document Revised: 12/05/2011 Document Reviewed: 09/01/2011 [facility] Patient Information 2014 [place], [place]. Sciatica Sciatica is pain, weakness, numbness, or tingling along  the path of the sciatic nerve. The nerve starts in the lower back and runs down the back of each leg. The nerve controls the muscles in the lower leg and in the back of the knee, while also providing sensation to the back  of the thigh, lower leg, and the sole of your foot. Sciatica is a symptom of another medical condition. For instance, nerve damage or certain conditions, such as a herniated disk or bone spur on the spine, pinch or put pressure on the sciatic nerve. This causes the pain, weakness, or other sensations normally associated with sciatica. Generally, sciatica only affects one side of the body. CAUSES   Herniated or slipped disc.  Degenerative disk disease.  A pain disorder involving the narrow muscle in the buttocks (piriformis syndrome).  Pelvic injury or fracture.  Pregnancy.  Tumor (rare). SYMPTOMS  Symptoms can vary from mild to very severe. The symptoms usually travel from the low back to the buttocks and down the back of the leg. Symptoms can include:  Mild tingling or dull aches in the lower back, leg, or hip.  Numbness in the back of the calf or sole of the foot.  Burning sensations in the lower back, leg, or hip.  Sharp pains in the lower back, leg, or hip.  Leg weakness.  Severe back pain inhibiting movement. These symptoms may get worse with coughing, sneezing, laughing, or prolonged sitting or standing. Also, being overweight may worsen symptoms. DIAGNOSIS  Your caregiver will perform a physical exam to look for common symptoms of sciatica. He or she may ask you to do certain movements or activities that would trigger sciatic nerve pain. Other tests may be performed to find the cause of the sciatica. These may include:  Blood tests.  X-rays.  Imaging tests, such as an MRI or CT scan. TREATMENT  Treatment is directed at the cause of the sciatic pain. Sometimes, treatment is not necessary and the pain and discomfort goes away on its own. If treatment is needed, your caregiver may suggest:  Over-the-counter medicines to relieve pain.  Prescription medicines, such as anti-inflammatory medicine, muscle relaxants, or narcotics.  Applying heat or ice to the painful  area.  Steroid injections to lessen pain, irritation, and inflammation around the nerve.  Reducing activity during periods of pain.  Exercising and stretching to strengthen your abdomen and improve flexibility of your spine. Your caregiver may suggest losing weight if the extra weight makes the back pain worse.  Physical therapy.  Surgery to eliminate what is pressing or pinching the nerve, such as a bone spur or part of a herniated disk. HOME CARE INSTRUCTIONS   Only take over-the-counter or prescription medicines for pain or discomfort as directed by your caregiver.  Apply ice to the affected area for 20 minutes, 3 4 times a day for the first 48 72 hours. Then try heat in the same way.  Exercise, stretch, or perform your usual activities if these do not aggravate your pain.  Attend physical therapy sessions as directed by your caregiver.  Keep all follow-up appointments as directed by your caregiver.  Do not wear high heels or shoes that do not provide proper support.  Check your mattress to see if it is too soft. A firm mattress may lessen your pain and discomfort. SEEK IMMEDIATE MEDICAL CARE IF:   You lose control of your bowel or bladder (incontinence).  You have increasing weakness in the lower back, pelvis, buttocks, or legs.  You have redness  or swelling of your back.  You have a burning sensation when you urinate.  You have pain that gets worse when you lie down or awakens you at night.  Your pain is worse than you have experienced in the past.  Your pain is lasting longer than 4 weeks.  You are suddenly losing weight without reason. MAKE SURE YOU:  Understand these instructions.  Will watch your condition.  Will get help right away if you are not doing well or get worse. Document Released: 09/06/2001 Document Revised: 03/13/2012 Document Reviewed: 01/22/2012 [facility] Patient Information 2014 [place], [place]. Spinal Stenosis One cause of back pain is  spinal stenosis. Stenosis means abnormal narrowing. The spinal canal contains and protects the spinal nerve roots. In spinal stenosis, the spinal canal narrows and pinches the spinal cord and nerves. This causes low back pain and pain in the legs. Stenosis may pinch the nerves that control muscles and sensation in the legs. This leads to pain and abnormal feelings in the leg muscles and areas supplied by those nerves. CAUSES  Spinal stenosis often happens to people as they get older and arthritic boney growths occur in their spinal canal. There is also a loss of the disk height between the bones of the back, which also adds to this problem. Sometimes the problem is present at birth. SYMPTOMS   Pain that is generally worse with activities, particularly standing and walking.  Numbness, tingling, hot or cold feelings, weakness, or a weariness in the legs.  Clumsiness, frequent falling, and a foot-slapping gait, which may come as a result of nerve pressure and muscle weakness. DIAGNOSIS   Your caregiver may suspect spinal stenosis if you have unusual leg symptoms, such as those previously mentioned.  Your orthopedic surgeon may request special imaging exams, such a computerized magnetic scan (MRI) or computerized X-ray scan (CT) to find out the cause of the problem. TREATMENT   Sometimes treatments such as postural changes or nonsteroidal anti-inflammatory drugs will relieve the pain.  Nonsteroidal anti-inflammatory medications may help relieve symptoms. These medicines do this by decreasing swelling and inflammation in the nerves.  When stenosis causes severe nerve root compression, conservative treatment may not be enough to maintain a normal lifestyle. Surgery may be recommended to relieve the pressure on affected nerves. In properly selected patients, the results are very good, and patients are able to continue a normal lifestyle. HOME CARE INSTRUCTIONS   Flexing the spine by leaning forward  while walking may relieve symptoms. Lying with the knees drawn up to the chest may offer some relief. These positions enlarge the space available to the nerves. They may make it easier for stenosis sufferers to walk longer distances.  Rest, followed by gradually resuming activity, also can help.  Aerobic activity, such as bicycling or swimming, is often recommended.  Losing weight can also relieve some of the load on the spine.  Application of warm or cold compresses to the area of pain can be helpful. SEEK MEDICAL CARE IF:   The periods of relief between episodes of pain become shorter and shorter.  You experience pain that radiates down your leg, even when you are not standing or walking. SEEK IMMEDIATE MEDICAL CARE IF:   You have a loss of bowel or bladder control.  You have a sudden loss of feeling in your legs.  You suddenly cannot move your legs. Document Released: 12/03/2003 Document Revised: 12/05/2011 Document Reviewed: 01/28/2010 [facility] Patient Information 2014 [place], [place].

## 2013-04-24 ENCOUNTER — Encounter: Payer: Self-pay | Admitting: Internal Medicine

## 2013-05-20 ENCOUNTER — Encounter: Payer: Self-pay | Admitting: Internal Medicine

## 2013-05-20 ENCOUNTER — Ambulatory Visit (INDEPENDENT_AMBULATORY_CARE_PROVIDER_SITE_OTHER): Payer: BC Managed Care – PPO | Admitting: Internal Medicine

## 2013-05-20 VITALS — BP 142/90 | HR 75 | Temp 98.5°F | Resp 20 | Wt 200.0 lb

## 2013-05-20 DIAGNOSIS — Z0130 Encounter for examination of blood pressure without abnormal findings: Secondary | ICD-10-CM

## 2013-05-20 DIAGNOSIS — Z136 Encounter for screening for cardiovascular disorders: Secondary | ICD-10-CM

## 2013-05-20 NOTE — Progress Notes (Signed)
  Subjective:    Patient ID: Gregory Hale, male    DOB: [birth year], 62 y.o.   MRN: [id number]  HPI see above  Wt Readings from Last 3 Encounters:  05/20/13 200 lb (90.719 kg)  04/19/13 210 lb (95.255 kg)  03/15/10 217 lb 8 oz (98.657 kg)    Review of Systems See above    Objective:   Physical Exam  See above      Assessment & Plan:  See above

## 2013-05-20 NOTE — Patient Instructions (Signed)
Please check your blood pressure on a regular basis.  If it is consistently greater than 150/90, please make an office appointment. Hypertension As your heart beats, it forces blood through your arteries. This force is your blood pressure. If the pressure is too high, it is called hypertension (HTN) or high blood pressure. HTN is dangerous because you may have it and not know it. High blood pressure may mean that your heart has to work harder to pump blood. Your arteries may be narrow or stiff. The extra work puts you at risk for heart disease, stroke, and other problems.  Blood pressure consists of two numbers, a higher number over a lower, 110/72, for example. It is stated as "110 over 72." The ideal is below 120 for the top number (systolic) and under 80 for the bottom (diastolic). Write down your blood pressure today. You should pay close attention to your blood pressure if you have certain conditions such as:  Heart failure.  Prior heart attack.  Diabetes  Chronic kidney disease.  Prior stroke.  Multiple risk factors for heart disease. To see if you have HTN, your blood pressure should be measured while you are seated with your arm held at the level of the heart. It should be measured at least twice. A one-time elevated blood pressure reading (especially in the Emergency Department) does not mean that you need treatment. There may be conditions in which the blood pressure is different between your right and left arms. It is important to see your caregiver soon for a recheck. Most people have essential hypertension which means that there is not a specific cause. This type of high blood pressure may be lowered by changing lifestyle factors such as:  Stress.  Smoking.  Lack of exercise.  Excessive weight.  Drug/tobacco/alcohol use.  Eating less salt. Most people do not have symptoms from high blood pressure until it has caused damage to the body. Effective treatment can often  prevent, delay or reduce that damage. TREATMENT  When a cause has been identified, treatment for high blood pressure is directed at the cause. There are a large number of medications to treat HTN. These fall into several categories, and your caregiver will help you select the medicines that are best for you. Medications may have side effects. You should review side effects with your caregiver. If your blood pressure stays high after you have made lifestyle changes or started on medicines,   Your medication(s) may need to be changed.  Other problems may need to be addressed.  Be certain you understand your prescriptions, and know how and when to take your medicine.  Be sure to follow up with your caregiver within the time frame advised (usually within two weeks) to have your blood pressure rechecked and to review your medications.  If you are taking more than one medicine to lower your blood pressure, make sure you know how and at what times they should be taken. Taking two medicines at the same time can result in blood pressure that is too low. SEEK IMMEDIATE MEDICAL CARE IF:  You develop a severe headache, blurred or changing vision, or confusion.  You have unusual weakness or numbness, or a faint feeling.  You have severe chest or abdominal pain, vomiting, or breathing problems. MAKE SURE YOU:   Understand these instructions.  Will watch your condition.  Will get help right away if you are not doing well or get worse. Document Released: 09/12/2005 Document Revised: 12/05/2011 Document Reviewed: 05/02/2008  [employer] Patient Information 2014 [employer].  

## 2013-05-20 NOTE — Progress Notes (Signed)
Subjective:    Patient ID: Gregory Hale, male    DOB: 1951-05-09, 62 y.o.   MRN: [id number]  HPI   62 year old patient who is seen today for followup and to reassess his blood pressure. For the past month he has lost 10 pounds in weight voluntarily with improved eating habits. Although he plays golf on weekends and generally walks and does occasional yard work no regular rigors exercise regimen. He has a monitor home blood pressure readings they're usually in the 140/90 range with  occasional readings both higher and lower.  Past Medical History  Diagnosis Date  . Arthritis     History   Social History  . Marital Status: Married    Spouse Name: N/A    Number of Children: N/A  . Years of Education: N/A   Occupational History  . Not on file.   Social History Main Topics  . Smoking status: Former Games developer  . Smokeless tobacco: Never Used  . Alcohol Use: 1.2 - 1.8 oz/week    2-3 Cans of beer per week  . Drug Use: No  . Sexual Activity: Not on file   Other Topics Concern  . Not on file   Social History Narrative  . No narrative on file    Past Surgical History  Procedure Laterality Date  . Parotid Left 1980  . Tonsillectomy      as a child    No family history on file.  No Known Allergies  Current Outpatient Prescriptions on File Prior to Visit  Medication Sig Dispense Refill  . Multiple Vitamin (MULTIVITAMIN) tablet Take 1 tablet by mouth daily.      . sildenafil (VIAGRA) 100 MG tablet Take 1 tablet (100 mg total) by mouth daily as needed for erectile dysfunction.  10 tablet  6   No current facility-administered medications on file prior to visit.    BP 142/90  Pulse 75  Temp(Src) 98.5 F (36.9 C) (Oral)  Resp 20  Wt 200 lb (90.719 kg)  BMI 28.7 kg/m2  SpO2 99%       Review of Systems  Constitutional: Negative for fever, chills, appetite change and fatigue.  HENT: Negative for hearing loss, ear pain, congestion, sore throat, trouble swallowing, neck  stiffness, dental problem, voice change and tinnitus.   Eyes: Negative for pain, discharge and visual disturbance.  Respiratory: Negative for cough, chest tightness, wheezing and stridor.   Cardiovascular: Negative for chest pain, palpitations and leg swelling.  Gastrointestinal: Negative for nausea, vomiting, abdominal pain, diarrhea, constipation, blood in stool and abdominal distention.  Genitourinary: Negative for urgency, hematuria, flank pain, discharge, difficulty urinating and genital sores.  Musculoskeletal: Negative for myalgias, back pain, joint swelling, arthralgias and gait problem.  Skin: Negative for rash.  Neurological: Negative for dizziness, syncope, speech difficulty, weakness, numbness and headaches.  Hematological: Negative for adenopathy. Does not bruise/bleed easily.  Psychiatric/Behavioral: Negative for behavioral problems and dysphoric mood. The patient is not nervous/anxious.        Objective:   Physical Exam  Constitutional: He appears well-developed and well-nourished. No distress.  Blood pressure as low as 134/80  on repeated determinations both arms          Assessment & Plan:   Blood pressure readings continue to be high normal. Patient has lost 10 pounds in weight. Will attempt a more regular and regular his aerobic exercise regimen. We'll continue to monitor home blood pressure readings if blood pressures are consistently greater the 140/90 we'll reassess. Otherwise will  see in one year for a complete annual exam

## 2013-07-03 ENCOUNTER — Encounter: Payer: Self-pay | Admitting: Internal Medicine

## 2014-05-27 LAB — CBC AND DIFFERENTIAL
HEMATOCRIT: 48 % (ref 41–53)
Hemoglobin: 15.9 g/dL (ref 13.5–17.5)
PLATELETS: 161 10*3/uL (ref 150–399)
WBC: 7.2 10^3/mL

## 2014-05-27 LAB — BASIC METABOLIC PANEL
BUN: 19 mg/dL (ref 4–21)
CREATININE: 1 mg/dL (ref <=1.3)
GLUCOSE: 103 mg/dL
Potassium: 5 mmol/L (ref 3.4–5.3)
Sodium: 142 mmol/L (ref 137–147)

## 2014-05-27 LAB — LIPID PANEL
Cholesterol: 174 mg/dL (ref 0–200)
HDL: 56 mg/dL (ref 35–70)
LDL CALC: 104 mg/dL
LDl/HDL Ratio: 3.1
Triglycerides: 70 mg/dL (ref 40–160)

## 2014-05-27 LAB — HEPATIC FUNCTION PANEL
ALT: 11 U/L (ref 10–40)
AST: 18 U/L (ref 14–40)
Alkaline Phosphatase: 64 U/L (ref 25–125)
BILIRUBIN DIRECT: 0.24 mg/dL
Bilirubin, Total: 1 mg/dL

## 2014-05-27 LAB — HEMOGLOBIN A1C: Hgb A1c MFr Bld: 5.1 % (ref 4.0–6.0)

## 2014-05-27 LAB — TSH: TSH: 5.62 u[IU]/mL (ref <=5.90)

## 2014-06-16 ENCOUNTER — Telehealth: Payer: Self-pay | Admitting: Internal Medicine

## 2014-06-16 NOTE — Telephone Encounter (Signed)
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

## 2014-06-18 ENCOUNTER — Encounter: Payer: Self-pay | Admitting: Internal Medicine

## 2014-06-19 NOTE — Telephone Encounter (Signed)
lmovm to cb and sch appt

## 2014-06-19 NOTE — Telephone Encounter (Signed)
Pt has been scheduled.  °

## 2014-06-19 NOTE — Telephone Encounter (Signed)
Okay, to work him in, just put two slots together.

## 2014-06-23 ENCOUNTER — Ambulatory Visit: Payer: BC Managed Care – PPO | Admitting: Internal Medicine

## 2014-06-23 ENCOUNTER — Ambulatory Visit (INDEPENDENT_AMBULATORY_CARE_PROVIDER_SITE_OTHER): Payer: BC Managed Care – PPO | Admitting: Internal Medicine

## 2014-06-23 ENCOUNTER — Encounter: Payer: Self-pay | Admitting: Internal Medicine

## 2014-06-23 VITALS — BP 138/78 | HR 66 | Temp 98.5°F | Resp 20 | Ht 70.0 in | Wt 206.0 lb

## 2014-06-23 DIAGNOSIS — Z0000 Encounter for general adult medical examination without abnormal findings: Secondary | ICD-10-CM

## 2014-06-23 LAB — TESTOSTERONE: Testosterone: 306.74 ng/dL (ref 300.00–890.00)

## 2014-06-23 MED ORDER — TERBINAFINE HCL 250 MG PO TABS: 250.0000 mg | ORAL_TABLET | Freq: Every day | ORAL | Status: DC

## 2014-06-23 NOTE — Progress Notes (Signed)
Subjective:    Patient ID: Gregory Hale, male    DOB: Jan 23, 1951, 63 y.o.   MRN: [id number]  HPI 63 year-old patient who is seen today for a preventive health exam.   Medical problems include borderline hypertension.  He also has occasional episodes of sciatica with pain lasting a few weeks. Was also told that he has spinal stenosis he does have occasional numbness in the legs with ambulation.  He is quite active the with activities such as golf without difficulties. He walks and carries his own clubs. Concerns today include the left heel pain.  He also has a history of ADD and Viagra has not been quite as effective.  He has a history of toenail onychomycosis and desires treatment.  He has failed the over-the-counter topical medications  He did have a colonoscopy in 2011 pathology revealed hyperplastic polyps  Social history married 3 year old son has been in [place] resident since 2009 relocated from [place]. 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  [place] of [employer] former smoker but discontinued 20 years ago  Family history father died when the patient was age 18 do to rheumatic heart disease mother died age 93 of unclear cardiac conditions 3 brothers 4 sisters one sister died of pancreatic cancer positive for diabetes and hypertension     Past Medical History  Diagnosis Date  . Arthritis     History   Social History  . Marital Status: Married    Spouse Name: N/A    Number of Children: N/A  . Years of Education: N/A   Occupational History  . Not on file.   Social History Main Topics  . Smoking status: Former Research scientist (life sciences)  . Smokeless tobacco: Never Used  . Alcohol Use: 1.2 - 1.8 oz/week    2-3 Cans of beer per week  . Drug Use: No  . Sexual Activity: Not on file   Other Topics Concern  . Not on file   Social History Narrative  . No narrative on file    Past Surgical History  Procedure Laterality Date  . Parotid  Left 1980  . Tonsillectomy      as a child    No family history on file.  No Known Allergies  Current Outpatient Prescriptions on File Prior to Visit  Medication Sig Dispense Refill  . Multiple Vitamin (MULTIVITAMIN) tablet Take 1 tablet by mouth daily.      . sildenafil (VIAGRA) 100 MG tablet Take 1 tablet (100 mg total) by mouth daily as needed for erectile dysfunction.  10 tablet  6   No current facility-administered medications on file prior to visit.    BP 138/78  Pulse 66  Temp(Src) 98.5 F (36.9 C) (Oral)  Resp 20  Ht 5\' 10"  (1.778 m)  Wt 206 lb (93.441 kg)  BMI 29.56 kg/m2  SpO2 98%      Review of Systems  Constitutional: Negative for fever, chills, activity change, appetite change and fatigue.  HENT: Negative for congestion, dental problem, ear pain, hearing loss, mouth sores, rhinorrhea, sinus pressure, sneezing, tinnitus, trouble swallowing and voice change.   Eyes: Negative for photophobia, pain, redness and visual disturbance.  Respiratory: Negative for apnea, cough, choking, chest tightness, shortness of breath and wheezing.   Cardiovascular: Negative for chest pain, palpitations and leg swelling.  Gastrointestinal: Negative for nausea, vomiting, abdominal pain, diarrhea, constipation, blood in stool, abdominal distention, anal bleeding and rectal pain.  Genitourinary: Negative for dysuria, urgency, frequency, hematuria, flank  pain, decreased urine volume, discharge, penile swelling, scrotal swelling, difficulty urinating, genital sores and testicular pain.  Musculoskeletal: Positive for back pain. Negative for arthralgias, gait problem, joint swelling, myalgias, neck pain and neck stiffness.       Rare episodes  of significant sciatica  Skin: Negative for color change, rash and wound.  Neurological: Negative for dizziness, tremors, seizures, syncope, facial asymmetry, speech difficulty, weakness, light-headedness, numbness and headaches.  Hematological:  Negative for adenopathy. Does not bruise/bleed easily.  Psychiatric/Behavioral: Negative for suicidal ideas, hallucinations, behavioral problems, confusion, sleep disturbance, self-injury, dysphoric mood, decreased concentration and agitation. The patient is not nervous/anxious.        Objective:   Physical Exam  Constitutional: He appears well-developed and well-nourished.  160/90  HENT:  Head: Normocephalic and atraumatic.  Right Ear: External ear normal.  Left Ear: External ear normal.  Nose: Nose normal.  Mouth/Throat: Oropharynx is clear and moist.  Eyes: Conjunctivae and EOM are normal. Pupils are equal, round, and reactive to light. No scleral icterus.  Neck: Normal range of motion. Neck supple. No JVD present. No thyromegaly present.  Cardiovascular: Regular rhythm, normal heart sounds and intact distal pulses.  Exam reveals no gallop and no friction rub.   No murmur heard. Pulmonary/Chest: Effort normal and breath sounds normal. He exhibits no tenderness.  Abdominal: Soft. Bowel sounds are normal. He exhibits no distension and no mass. There is no tenderness.  Genitourinary: Prostate normal and penis normal. Guaiac negative stool.  Few external hemorrhoids  Musculoskeletal: Normal range of motion. He exhibits no edema and no tenderness.  Lymphadenopathy:    He has no cervical adenopathy.  Neurological: He is alert. He has normal reflexes. No cranial nerve deficit. Coordination normal.  Skin: Skin is warm and dry. No rash noted.  Toenail onychomycotic changes  Psychiatric: He has a normal mood and affect. His behavior is normal.          Assessment & Plan:   Preventive health exam  History of hyperplastic polyps History of sciatica History mild spinal stenosis  Modest weight loss encouraged Home blood pressure monitoring encouraged Low-salt diet recommended Plantar fasciitis Toenail onychomycosis.  Will treat with Lamisil ED.  Will check a testosterone level.   Was given samples of both Cialis and Levitra  Recheck 1 year Lab update at work in September  reviewed

## 2014-06-23 NOTE — Patient Instructions (Addendum)
Health Maintenance A healthy lifestyle and preventative care can promote health and wellness.  Maintain regular health, dental, and eye exams.  Eat a healthy diet. Foods like vegetables, fruits, whole grains, low-fat dairy products, and lean protein foods contain the nutrients you need and are low in calories. Decrease your intake of foods high in solid fats, added sugars, and salt. Get information about a proper diet from your health care provider, if necessary.  Regular physical exercise is one of the most important things you can do for your health. Most adults should get at least 150 minutes of moderate-intensity exercise (any activity that increases your heart rate and causes you to sweat) each week. In addition, most adults need muscle-strengthening exercises on 2 or more days a week.   Maintain a healthy weight. The body mass index (BMI) is a screening tool to identify possible weight problems. It provides an estimate of body fat based on height and weight. Your health care provider can find your BMI and can help you achieve or maintain a healthy weight. For males 20 years and older:  A BMI below 18.5 is considered underweight.  A BMI of 18.5 to 24.9 is normal.  A BMI of 25 to 29.9 is considered overweight.  A BMI of 30 and above is considered obese.  Maintain normal blood lipids and cholesterol by exercising and minimizing your intake of saturated fat. Eat a balanced diet with plenty of fruits and vegetables. Blood tests for lipids and cholesterol should begin at age 20 and be repeated every 5 years. If your lipid or cholesterol levels are high, you are over age 50, or you are at high risk for heart disease, you may need your cholesterol levels checked more frequently.Ongoing high lipid and cholesterol levels should be treated with medicines if diet and exercise are not working.  If you smoke, find out from your health care provider how to quit. If you do not use tobacco, do not  start.  Lung cancer screening is recommended for adults aged 55-80 years who are at high risk for developing lung cancer because of a history of smoking. A yearly low-dose CT scan of the lungs is recommended for people who have at least a 30-pack-year history of smoking and are current smokers or have quit within the past 15 years. A pack year of smoking is smoking an average of 1 pack of cigarettes a day for 1 year (for example, a 30-pack-year history of smoking could mean smoking 1 pack a day for 30 years or 2 packs a day for 15 years). Yearly screening should continue until the smoker has stopped smoking for at least 15 years. Yearly screening should be stopped for people who develop a health problem that would prevent them from having lung cancer treatment.  If you choose to drink alcohol, do not have more than 2 drinks per day. One drink is considered to be 12 oz (360 mL) of beer, 5 oz (150 mL) of wine, or 1.5 oz (45 mL) of liquor.  Avoid the use of street drugs. Do not share needles with anyone. Ask for help if you need support or instructions about stopping the use of drugs.  High blood pressure causes heart disease and increases the risk of stroke. Blood pressure should be checked at least every 1-2 years. Ongoing high blood pressure should be treated with medicines if weight loss and exercise are not effective.  If you are 45-79 years old, ask your health care provider if   you should take aspirin to prevent heart disease.  Diabetes screening involves taking a blood sample to check your fasting blood sugar level. This should be done once every 3 years after age 45 if you are at a normal weight and without risk factors for diabetes. Testing should be considered at a younger age or be carried out more frequently if you are overweight and have at least 1 risk factor for diabetes.  Colorectal cancer can be detected and often prevented. Most routine colorectal cancer screening begins at the age of 50  and continues through age 75. However, your health care provider may recommend screening at an earlier age if you have risk factors for colon cancer. On a yearly basis, your health care provider may provide home test kits to check for hidden blood in the stool. A small camera at the end of a tube may be used to directly examine the colon (sigmoidoscopy or colonoscopy) to detect the earliest forms of colorectal cancer. Talk to your health care provider about this at age 50 when routine screening begins. A direct exam of the colon should be repeated every 5-10 years through age 75, unless early forms of precancerous polyps or small growths are found.  People who are at an increased risk for hepatitis B should be screened for this virus. You are considered at high risk for hepatitis B if:  You were born in a country where hepatitis B occurs often. Talk with your health care provider about which countries are considered high risk.  Your parents were born in a high-risk country and you have not received a shot to protect against hepatitis B (hepatitis B vaccine).  You have HIV or AIDS.  You use needles to inject street drugs.  You live with, or have sex with, someone who has hepatitis B.  You are a man who has sex with other men (MSM).  You get hemodialysis treatment.  You take certain medicines for conditions like cancer, organ transplantation, and autoimmune conditions.  Hepatitis C blood testing is recommended for all people born from 1945 through 1965 and any individual with known risk factors for hepatitis C.  Healthy men should no longer receive prostate-specific antigen (PSA) blood tests as part of routine cancer screening. Talk to your health care provider about prostate cancer screening.  Testicular cancer screening is not recommended for adolescents or adult males who have no symptoms. Screening includes self-exam, a health care provider exam, and other screening tests. Consult with your  health care provider about any symptoms you have or any concerns you have about testicular cancer.  Practice safe sex. Use condoms and avoid high-risk sexual practices to reduce the spread of sexually transmitted infections (STIs).  You should be screened for STIs, including gonorrhea and chlamydia if:  You are sexually active and are younger than 24 years.  You are older than 24 years, and your health care provider tells you that you are at risk for this type of infection.  Your sexual activity has changed since you were last screened, and you are at an increased risk for chlamydia or gonorrhea. Ask your health care provider if you are at risk.  If you are at risk of being infected with HIV, it is recommended that you take a prescription medicine daily to prevent HIV infection. This is called pre-exposure prophylaxis (PrEP). You are considered at risk if:  You are a man who has sex with other men (MSM).  You are a heterosexual man who   is sexually active with multiple partners.  You take drugs by injection.  You are sexually active with a partner who has HIV.  Talk with your health care provider about whether you are at high risk of being infected with HIV. If you choose to begin PrEP, you should first be tested for HIV. You should then be tested every 3 months for as long as you are taking PrEP.  Use sunscreen. Apply sunscreen liberally and repeatedly throughout the day. You should seek shade when your shadow is shorter than you. Protect yourself by wearing long sleeves, pants, a wide-brimmed hat, and sunglasses year round whenever you are outdoors.  Tell your health care provider of new moles or changes in moles, especially if there is a change in shape or color. Also, tell your health care provider if a mole is larger than the size of a pencil eraser.  A one-time screening for abdominal aortic aneurysm (AAA) and surgical repair of large AAAs by ultrasound is recommended for men aged  57-75 years who are current or former smokers.  Stay current with your vaccines (immunizations). Document Released: 03/10/2008 Document Revised: 09/17/2013 Document Reviewed: 02/07/2011 [facility] Patient Information 2015 [place], [place]. This information is not intended to replace advice given to you by your health care provider. Make sure you discuss any questions you have with your health care provider. Plantar Fasciitis (Heel Spur Syndrome) with Rehab The plantar fascia is a fibrous, ligament-like, soft-tissue structure that spans the bottom of the foot. Plantar fasciitis is a condition that causes pain in the foot due to inflammation of the tissue. SYMPTOMS   Pain and tenderness on the underneath side of the foot.  Pain that worsens with standing or walking. CAUSES  Plantar fasciitis is caused by irritation and injury to the plantar fascia on the underneath side of the foot. Common mechanisms of injury include:  Direct trauma to bottom of the foot.  Damage to a small nerve that runs under the foot where the main fascia attaches to the heel bone.  Stress placed on the plantar fascia due to bone spurs. RISK INCREASES WITH:   Activities that place stress on the plantar fascia (running, jumping, pivoting, or cutting).  Poor strength and flexibility.  Improperly fitted shoes.  Tight calf muscles.  Flat feet.  Failure to warm-up properly before activity.  Obesity. PREVENTION  Warm up and stretch properly before activity.  Allow for adequate recovery between workouts.  Maintain physical fitness:  Strength, flexibility, and endurance.  Cardiovascular fitness.  Maintain a health body weight.  Avoid stress on the plantar fascia.  Wear properly fitted shoes, including arch supports for individuals who have flat feet. PROGNOSIS  If treated properly, then the symptoms of plantar fasciitis usually resolve without surgery. However, occasionally surgery is necessary. RELATED  COMPLICATIONS   Recurrent symptoms that may result in a chronic condition.  Problems of the lower back that are caused by compensating for the injury, such as limping.  Pain or weakness of the foot during push-off following surgery.  Chronic inflammation, scarring, and partial or complete fascia tear, occurring more often from repeated injections. TREATMENT  Treatment initially involves the use of ice and medication to help reduce pain and inflammation. The use of strengthening and stretching exercises may help reduce pain with activity, especially stretches of the Achilles tendon. These exercises may be performed at home or with a therapist. Your caregiver may recommend that you use heel cups of arch supports to help reduce stress on  the plantar fascia. Occasionally, corticosteroid injections are given to reduce inflammation. If symptoms persist for greater than 6 months despite non-surgical (conservative), then surgery may be recommended.  MEDICATION   If pain medication is necessary, then nonsteroidal anti-inflammatory medications, such as aspirin and ibuprofen, or other minor pain relievers, such as acetaminophen, are often recommended.  Do not take pain medication within 7 days before surgery.  Prescription pain relievers may be given if deemed necessary by your caregiver. Use only as directed and only as much as you need.  Corticosteroid injections may be given by your caregiver. These injections should be reserved for the most serious cases, because they may only be given a certain number of times. HEAT AND COLD  Cold treatment (icing) relieves pain and reduces inflammation. Cold treatment should be applied for 10 to 15 minutes every 2 to 3 hours for inflammation and pain and immediately after any activity that aggravates your symptoms. Use ice packs or massage the area with a piece of ice (ice massage).  Heat treatment may be used prior to performing the stretching and strengthening  activities prescribed by your caregiver, physical therapist, or athletic trainer. Use a heat pack or soak the injury in warm water. SEEK IMMEDIATE MEDICAL CARE IF:  Treatment seems to offer no benefit, or the condition worsens.  Any medications produce adverse side effects. EXERCISES RANGE OF MOTION (ROM) AND STRETCHING EXERCISES - Plantar Fasciitis (Heel Spur Syndrome) These exercises may help you when beginning to rehabilitate your injury. Your symptoms may resolve with or without further involvement from your physician, physical therapist or athletic trainer. While completing these exercises, remember:   Restoring tissue flexibility helps normal motion to return to the joints. This allows healthier, less painful movement and activity.  An effective stretch should be held for at least 30 seconds.  A stretch should never be painful. You should only feel a gentle lengthening or release in the stretched tissue. RANGE OF MOTION - Toe Extension, Flexion  Sit with your right / left leg crossed over your opposite knee.  Grasp your toes and gently pull them back toward the top of your foot. You should feel a stretch on the bottom of your toes and/or foot.  Hold this stretch for __________ seconds.  Now, gently pull your toes toward the bottom of your foot. You should feel a stretch on the top of your toes and or foot.  Hold this stretch for __________ seconds. Repeat __________ times. Complete this stretch __________ times per day.  RANGE OF MOTION - Ankle Dorsiflexion, Active Assisted  Remove shoes and sit on a chair that is preferably not on a carpeted surface.  Place right / left foot under knee. Extend your opposite leg for support.  Keeping your heel down, slide your right / left foot back toward the chair until you feel a stretch at your ankle or calf. If you do not feel a stretch, slide your bottom forward to the edge of the chair, while still keeping your heel down.  Hold this  stretch for __________ seconds. Repeat __________ times. Complete this stretch __________ times per day.  STRETCH - Gastroc, Standing  Place hands on wall.  Extend right / left leg, keeping the front knee somewhat bent.  Slightly point your toes inward on your back foot.  Keeping your right / left heel on the floor and your knee straight, shift your weight toward the wall, not allowing your back to arch.  You should feel a gentle  stretch in the right / left calf. Hold this position for __________ seconds. Repeat __________ times. Complete this stretch __________ times per day. STRETCH - Soleus, Standing  Place hands on wall.  Extend right / left leg, keeping the other knee somewhat bent.  Slightly point your toes inward on your back foot.  Keep your right / left heel on the floor, bend your back knee, and slightly shift your weight over the back leg so that you feel a gentle stretch deep in your back calf.  Hold this position for __________ seconds. Repeat __________ times. Complete this stretch __________ times per day. STRETCH - Gastrocsoleus, Standing  Note: This exercise can place a lot of stress on your foot and ankle. Please complete this exercise only if specifically instructed by your caregiver.   Place the ball of your right / left foot on a step, keeping your other foot firmly on the same step.  Hold on to the wall or a rail for balance.  Slowly lift your other foot, allowing your body weight to press your heel down over the edge of the step.  You should feel a stretch in your right / left calf.  Hold this position for __________ seconds.  Repeat this exercise with a slight bend in your right / left knee. Repeat __________ times. Complete this stretch __________ times per day.  STRENGTHENING EXERCISES - Plantar Fasciitis (Heel Spur Syndrome)  These exercises may help you when beginning to rehabilitate your injury. They may resolve your symptoms with or without  further involvement from your physician, physical therapist or athletic trainer. While completing these exercises, remember:   Muscles can gain both the endurance and the strength needed for everyday activities through controlled exercises.  Complete these exercises as instructed by your physician, physical therapist or athletic trainer. Progress the resistance and repetitions only as guided. STRENGTH - Towel Curls  Sit in a chair positioned on a non-carpeted surface.  Place your foot on a towel, keeping your heel on the floor.  Pull the towel toward your heel by only curling your toes. Keep your heel on the floor.  If instructed by your physician, physical therapist or athletic trainer, add ____________________ at the end of the towel. Repeat __________ times. Complete this exercise __________ times per day. STRENGTH - Ankle Inversion  Secure one end of a rubber exercise band/tubing to a fixed object (table, pole). Loop the other end around your foot just before your toes.  Place your fists between your knees. This will focus your strengthening at your ankle.  Slowly, pull your big toe up and in, making sure the band/tubing is positioned to resist the entire motion.  Hold this position for __________ seconds.  Have your muscles resist the band/tubing as it slowly pulls your foot back to the starting position. Repeat __________ times. Complete this exercises __________ times per day.  Document Released: 09/12/2005 Document Revised: 12/05/2011 Document Reviewed: 12/25/2008 [facility] Patient Information 2015 [place], [place]. This information is not intended to replace advice given to you by your health care provider. Make sure you discuss any questions you have with your health care provider. Ringworm, Nail A fungal infection of the nail (tinea unguium/onychomycosis) is common. It is common as the visible part of the nail is composed of dead cells which have no blood supply to help prevent  infection. It occurs because fungi are everywhere and will pick any opportunity to grow on any dead material. Because nails are very slow growing they require  up to 2 years of treatment with anti-fungal medications. The entire nail back to the base is infected. This includes approximately  of the nail which you cannot see. If your caregiver has prescribed a medication by mouth, take it every day and as directed. No progress will be seen for at least 6 to 9 months. Do not be disappointed! Because fungi live on dead cells with little or no exposure to blood supply, medication delivery to the infection is slow; thus the cure is slow. It is also why you can observe no progress in the first 6 months. The nail becoming cured is the base of the nail, as it has the blood supply. Topical medication such as creams and ointments are usually not effective. Important in successful treatment of nail fungus is closely following the medication regimen that your doctor prescribes. Sometimes you and your caregiver may elect to speed up this process by surgical removal of all the nails. Even this may still require 6 to 9 months of additional oral medications. See your caregiver as directed. Remember there will be no visible improvement for at least 6 months. See your caregiver sooner if other signs of infection (redness and swelling) develop. Document Released: 09/09/2000 Document Revised: 12/05/2011 Document Reviewed: 11/18/2008 [facility] Patient Information 2015 [place], [place]. This information is not intended to replace advice given to you by your health care provider. Make sure you discuss any questions you have with your health care provider.

## 2014-06-23 NOTE — Progress Notes (Signed)
Pre visit review using our clinic review tool, if applicable. No additional management support is needed unless otherwise documented below in the visit note. 

## 2014-07-07 ENCOUNTER — Telehealth: Payer: Self-pay | Admitting: Internal Medicine

## 2014-07-07 NOTE — Telephone Encounter (Signed)
Please notify patient that testosterone level was in a low normal range

## 2014-07-07 NOTE — Telephone Encounter (Signed)
Pt is calling requesting blood work results for 06-23-14

## 2014-07-07 NOTE — Telephone Encounter (Signed)
Please advise 

## 2014-07-08 NOTE — Telephone Encounter (Signed)
Spoke to pt told him testosterone level was in low normal range per Dr.K. Pt verbalized understanding and asked what does he suggest. Told pt he gave you samples of Cialis and Levitra if that is not helpful then there are injections or gel to use, but would have to check with insurance if covered. Pt verbalized understanding.

## 2014-07-11 ENCOUNTER — Telehealth: Payer: Self-pay | Admitting: Internal Medicine

## 2014-07-11 MED ORDER — TERBINAFINE HCL 250 MG PO TABS: 250.0000 mg | ORAL_TABLET | Freq: Every day | ORAL | Status: DC

## 2014-07-11 NOTE — Telephone Encounter (Signed)
Pt states he never picked up the terbinafine (LAMISIL) 250 MG tablet And now [facility] does not have.   He would like to know if you could resend except send to [facility]

## 2014-07-11 NOTE — Telephone Encounter (Signed)
Left detailed message Rx sent to pharmacy as requested. 

## 2014-08-19 ENCOUNTER — Encounter: Payer: BC Managed Care – PPO | Admitting: Internal Medicine

## 2014-09-22 ENCOUNTER — Telehealth: Payer: Self-pay | Admitting: Internal Medicine

## 2014-09-22 MED ORDER — SILDENAFIL CITRATE 100 MG PO TABS: 100.0000 mg | ORAL_TABLET | Freq: Every day | ORAL | Status: DC | PRN

## 2014-09-22 NOTE — Telephone Encounter (Signed)
Dr. Raliegh Ip, see message and advise.

## 2014-09-22 NOTE — Telephone Encounter (Signed)
Spoke to pt, wanted his Viagra refilled even though not working well. He said Dr. Raliegh Ip mentioned some lotion or gel instead that was new. Told him I am not sure but will send message and refill Viagra. Pt verbalized understanding.

## 2014-09-22 NOTE — Telephone Encounter (Signed)
Okay to refill Viagra 100 mg

## 2014-09-22 NOTE — Telephone Encounter (Signed)
Pt would like nurse to return his call. Pt has a ? Concerning Viagra. Pt would not elaborate

## 2014-09-23 MED ORDER — SILDENAFIL CITRATE 100 MG PO TABS: 100.0000 mg | ORAL_TABLET | Freq: Every day | ORAL | Status: DC | PRN

## 2014-09-23 NOTE — Telephone Encounter (Signed)
Rx sent to pharmacy   

## 2015-04-27 ENCOUNTER — Encounter: Payer: Self-pay | Admitting: Internal Medicine

## 2015-06-05 LAB — BASIC METABOLIC PANEL
BUN: 13 mg/dL (ref 4–21)
Creatinine: 1 mg/dL (ref 0.6–1.3)
Glucose: 111 mg/dL
Potassium: 4.7 mmol/L (ref 3.4–5.3)
Sodium: 146 mmol/L (ref 137–147)

## 2015-06-05 LAB — LIPID PANEL
Cholesterol: 161 mg/dL (ref 0–200)
HDL: 59 mg/dL (ref 35–70)
LDL Cholesterol: 93 mg/dL
LDl/HDL Ratio: 2.7
Triglycerides: 44 mg/dL (ref 40–160)

## 2015-06-05 LAB — HEPATIC FUNCTION PANEL
ALT: 16 U/L (ref 10–40)
AST: 17 U/L (ref 14–40)
Alkaline Phosphatase: 77 U/L (ref 25–125)
BILIRUBIN, TOTAL: 0.5 mg/dL
Bilirubin, Direct: 0.19 mg/dL (ref 0.01–0.4)

## 2015-06-05 LAB — CBC AND DIFFERENTIAL
HEMATOCRIT: 49 % (ref 41–53)
Hemoglobin: 16.9 g/dL (ref 13.5–17.5)
Neutrophils Absolute: 4 /uL
PLATELETS: 173 10*3/uL (ref 150–399)
WBC: 6.9 10*3/mL

## 2015-06-05 LAB — TSH: TSH: 4.52 u[IU]/mL (ref 0.41–5.90)

## 2015-06-05 LAB — PSA: PSA: 0.6

## 2015-06-05 LAB — HEMOGLOBIN A1C: HEMOGLOBIN A1C: 5.4 % (ref 4.0–6.0)

## 2015-06-08 ENCOUNTER — Encounter: Payer: Self-pay | Admitting: Internal Medicine

## 2015-07-20 ENCOUNTER — Encounter: Payer: Self-pay | Admitting: Internal Medicine

## 2015-07-20 ENCOUNTER — Ambulatory Visit (INDEPENDENT_AMBULATORY_CARE_PROVIDER_SITE_OTHER): Payer: BLUE CROSS/BLUE SHIELD | Admitting: Internal Medicine

## 2015-07-20 VITALS — BP 130/90 | HR 67 | Temp 98.6°F | Resp 16 | Ht 68.5 in | Wt 196.0 lb

## 2015-07-20 DIAGNOSIS — E349 Endocrine disorder, unspecified: Secondary | ICD-10-CM

## 2015-07-20 DIAGNOSIS — E291 Testicular hypofunction: Secondary | ICD-10-CM

## 2015-07-20 DIAGNOSIS — Z860100 Personal history of colon polyps, unspecified: Secondary | ICD-10-CM

## 2015-07-20 DIAGNOSIS — N529 Male erectile dysfunction, unspecified: Secondary | ICD-10-CM

## 2015-07-20 DIAGNOSIS — E039 Hypothyroidism, unspecified: Secondary | ICD-10-CM | POA: Diagnosis not present

## 2015-07-20 DIAGNOSIS — Z23 Encounter for immunization: Secondary | ICD-10-CM

## 2015-07-20 NOTE — Progress Notes (Signed)
Pre visit review using our clinic review tool, if applicable. No additional management support is needed unless otherwise documented below in the visit note. 

## 2015-07-20 NOTE — Progress Notes (Signed)
Subjective:    Patient ID: Gregory Hale, male    DOB: [birth year], 64 y.o.   MRN: [id number]  HPI 64  year-old patient who is seen today for a preventive health exam.   Medical problems include borderline hypertension.  He also has occasional episodes of sciatica with pain lasting a few weeks. Was also told that he has spinal stenosis he does have occasional numbness in the legs with ambulation.  He is quite active the with activities such as golf without difficulties. He walks and carries his own clubs. He also has a history of ED and Viagra has not been quite as effective. He did have a colonoscopy in 2011 pathology revealed hyperplastic polyps  Social history married 35 year old son has been in [place] resident since 2009 relocated from [place]. 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  Member  of [employer] former smoker but discontinued 20 years ago  Family history father died when the patient was age 21 do to rheumatic heart disease mother died age 43 of unclear cardiac conditions 3 brothers 4 sisters one sister died of pancreatic cancer positive for diabetes and hypertension     Past Medical History  Diagnosis Date  . Arthritis     Social History   Social History  . Marital Status: Married    Spouse Name: N/A  . Number of Children: N/A  . Years of Education: N/A   Occupational History  . Not on file.   Social History Main Topics  . Smoking status: Former Research scientist (life sciences)  . Smokeless tobacco: Never Used  . Alcohol Use: 1.2 - 1.8 oz/week    2-3 Cans of beer per week  . Drug Use: No  . Sexual Activity: Not on file   Other Topics Concern  . Not on file   Social History Narrative    Past Surgical History  Procedure Laterality Date  . Parotid Left 1980  . Tonsillectomy      as a child    No family history on file.  No Known Allergies  Current Outpatient Prescriptions on File Prior to Visit  Medication Sig Dispense  Refill  . Multiple Vitamin (MULTIVITAMIN) tablet Take 1 tablet by mouth daily.    . sildenafil (VIAGRA) 100 MG tablet Take 1 tablet (100 mg total) by mouth daily as needed for erectile dysfunction. 10 tablet 2  . terbinafine (LAMISIL) 250 MG tablet Take 1 tablet (250 mg total) by mouth daily. 90 tablet 0   No current facility-administered medications on file prior to visit.    There were no vitals taken for this visit.      Review of Systems  Constitutional: Negative for fever, chills, activity change, appetite change and fatigue.  HENT: Negative for congestion, dental problem, ear pain, hearing loss, mouth sores, rhinorrhea, sinus pressure, sneezing, tinnitus, trouble swallowing and voice change.   Eyes: Negative for photophobia, pain, redness and visual disturbance.  Respiratory: Negative for apnea, cough, choking, chest tightness, shortness of breath and wheezing.   Cardiovascular: Negative for chest pain, palpitations and leg swelling.  Gastrointestinal: Negative for nausea, vomiting, abdominal pain, diarrhea, constipation, blood in stool, abdominal distention, anal bleeding and rectal pain.  Genitourinary: Negative for dysuria, urgency, frequency, hematuria, flank pain, decreased urine volume, discharge, penile swelling, scrotal swelling, difficulty urinating, genital sores and testicular pain.  Musculoskeletal: Positive for back pain. Negative for myalgias, joint swelling, arthralgias, gait problem, neck pain and neck stiffness.       Rare episodes  of significant sciatica  Skin: Negative for color change, rash and wound.  Neurological: Negative for dizziness, tremors, seizures, syncope, facial asymmetry, speech difficulty, weakness, light-headedness, numbness and headaches.  Hematological: Negative for adenopathy. Does not bruise/bleed easily.  Psychiatric/Behavioral: Negative for suicidal ideas, hallucinations, behavioral problems, confusion, sleep disturbance, self-injury,  dysphoric mood, decreased concentration and agitation. The patient is not nervous/anxious.        Objective:   Physical Exam  Constitutional: He appears well-developed and well-nourished.  160/90  HENT:  Head: Normocephalic and atraumatic.  Right Ear: External ear normal.  Left Ear: External ear normal.  Nose: Nose normal.  Mouth/Throat: Oropharynx is clear and moist.  Eyes: Conjunctivae and EOM are normal. Pupils are equal, round, and reactive to light. No scleral icterus.  Neck: Normal range of motion. Neck supple. No JVD present. No thyromegaly present.  Cardiovascular: Regular rhythm, normal heart sounds and intact distal pulses.  Exam reveals no gallop and no friction rub.   No murmur heard. Pulmonary/Chest: Effort normal and breath sounds normal. He exhibits no tenderness.  Abdominal: Soft. Bowel sounds are normal. He exhibits no distension and no mass. There is no tenderness.  Genitourinary: Prostate normal and penis normal. Guaiac negative stool.  Few external hemorrhoids  Musculoskeletal: Normal range of motion. He exhibits no edema or tenderness.  Lymphadenopathy:    He has no cervical adenopathy.  Neurological: He is alert. He has normal reflexes. No cranial nerve deficit. Coordination normal.  Skin: Skin is warm and dry. No rash noted.  Toenail onychomycotic changes  Psychiatric: He has a normal mood and affect. His behavior is normal.          Assessment & Plan:   Preventive health exam  History of hyperplastic polyps History of sciatica History mild spinal stenosis  Modest weight loss encouraged Home blood pressure monitoring encouraged Low-salt diet recommended  ED.  Will check a testosterone level.  Was given samples of both Cialis and Levitra  Recheck 1 year

## 2015-07-20 NOTE — Patient Instructions (Signed)

## 2015-07-21 ENCOUNTER — Encounter: Payer: Self-pay | Admitting: Internal Medicine

## 2015-07-27 ENCOUNTER — Telehealth: Payer: Self-pay | Admitting: Internal Medicine

## 2015-07-27 NOTE — Telephone Encounter (Signed)
Gregory Hale, that is okay as long as pt has check with his insurance if Shingles vaccine is covered.

## 2015-07-27 NOTE — Telephone Encounter (Signed)
Pt is coming in for a testosterone injection at 9:45 and will need a shingles injections will you be able to give him the injection when he come in for the lab . I put him on your schedule for 11:45  That day 07/30/15

## 2015-07-28 NOTE — Telephone Encounter (Signed)
I asked him if he checked with his insurance company and he said yes

## 2015-07-28 NOTE — Telephone Encounter (Signed)
Noted  

## 2015-07-30 ENCOUNTER — Other Ambulatory Visit (INDEPENDENT_AMBULATORY_CARE_PROVIDER_SITE_OTHER): Payer: BLUE CROSS/BLUE SHIELD

## 2015-07-30 ENCOUNTER — Ambulatory Visit (INDEPENDENT_AMBULATORY_CARE_PROVIDER_SITE_OTHER): Payer: BLUE CROSS/BLUE SHIELD | Admitting: *Deleted

## 2015-07-30 DIAGNOSIS — N529 Male erectile dysfunction, unspecified: Secondary | ICD-10-CM | POA: Diagnosis not present

## 2015-07-30 DIAGNOSIS — Z23 Encounter for immunization: Secondary | ICD-10-CM

## 2015-07-30 LAB — TESTOSTERONE: Testosterone: 338.74 ng/dL (ref 300.00–890.00)

## 2015-08-25 ENCOUNTER — Telehealth: Payer: Self-pay | Admitting: Internal Medicine

## 2015-08-25 NOTE — Telephone Encounter (Signed)
Please notify patient that his testosterone level is normal.  No change in therapy

## 2015-08-25 NOTE — Telephone Encounter (Signed)
Please see message and advise regarding Testosterone level result.

## 2015-08-25 NOTE — Telephone Encounter (Signed)
Pt call to say base on the results off his labs what does Dr Raliegh Ip want him to do. Would like a call back

## 2015-08-27 NOTE — Telephone Encounter (Signed)
Spoke to Gregory Hale told him Testosterone level is normal, no change in therapy per Dr. Raliegh Ip. Gregory Hale verbalized understanding.

## 2015-09-14 ENCOUNTER — Ambulatory Visit (INDEPENDENT_AMBULATORY_CARE_PROVIDER_SITE_OTHER): Payer: BLUE CROSS/BLUE SHIELD | Admitting: Internal Medicine

## 2015-09-14 ENCOUNTER — Encounter: Payer: Self-pay | Admitting: Internal Medicine

## 2015-09-14 VITALS — BP 140/90 | HR 63 | Temp 97.9°F | Resp 20 | Ht 68.5 in | Wt 200.0 lb

## 2015-09-14 DIAGNOSIS — F528 Other sexual dysfunction not due to a substance or known physiological condition: Secondary | ICD-10-CM | POA: Diagnosis not present

## 2015-09-14 MED ORDER — TESTOSTERONE 20.25 MG/ACT (1.62%) TD GEL
2.0000 | TRANSDERMAL | Status: DC
Start: 2015-09-14 — End: 2015-09-15

## 2015-09-14 NOTE — Progress Notes (Signed)
   Subjective:    Patient ID: Gregory Hale, male    DOB: 1951/01/13, 64 y.o.   MRN: [id number]  HPI  64 year old patient who has a history of ED.  At times, this is associated with decreased libido.  Testosterone levels have consistently been at the very lowest limits of normal.  He has tried both Viagra and Cialis with only marginal benefit.  Past Medical History  Diagnosis Date  . Arthritis     Social History   Social History  . Marital Status: Married    Spouse Name: N/A  . Number of Children: N/A  . Years of Education: N/A   Occupational History  . Not on file.   Social History Main Topics  . Smoking status: Former Research scientist (life sciences)  . Smokeless tobacco: Never Used  . Alcohol Use: 1.2 - 1.8 oz/week    2-3 Cans of beer per week  . Drug Use: No  . Sexual Activity: Not on file   Other Topics Concern  . Not on file   Social History Narrative    Past Surgical History  Procedure Laterality Date  . Parotid Left 1980  . Tonsillectomy      as a child    No family history on file.  No Known Allergies  Current Outpatient Prescriptions on File Prior to Visit  Medication Sig Dispense Refill  . Multiple Vitamin (MULTIVITAMIN) tablet Take 1 tablet by mouth daily.    . sildenafil (VIAGRA) 100 MG tablet Take 1 tablet (100 mg total) by mouth daily as needed for erectile dysfunction. 10 tablet 2  . terbinafine (LAMISIL) 250 MG tablet Take 1 tablet (250 mg total) by mouth daily. 90 tablet 0   No current facility-administered medications on file prior to visit.    BP 140/90 mmHg  Pulse 63  Temp(Src) 97.9 F (36.6 C) (Oral)  Resp 20  Ht 5' 8.5" (1.74 m)  Wt 200 lb (90.719 kg)  BMI 29.96 kg/m2  SpO2 98%     Review of Systems  Constitutional: Negative for fever, chills, appetite change and fatigue.  HENT: Positive for postnasal drip. Negative for congestion, dental problem, ear pain, hearing loss, sore throat, tinnitus, trouble swallowing and voice change.   Eyes: Negative  for pain, discharge and visual disturbance.  Respiratory: Positive for cough. Negative for chest tightness, wheezing and stridor.   Cardiovascular: Negative for chest pain, palpitations and leg swelling.  Gastrointestinal: Negative for nausea, vomiting, abdominal pain, diarrhea, constipation, blood in stool and abdominal distention.  Genitourinary: Negative for urgency, hematuria, flank pain, discharge, difficulty urinating and genital sores.  Musculoskeletal: Negative for myalgias, back pain, joint swelling, arthralgias, gait problem and neck stiffness.  Skin: Negative for rash.  Neurological: Negative for dizziness, syncope, speech difficulty, weakness, numbness and headaches.  Hematological: Negative for adenopathy. Does not bruise/bleed easily.  Psychiatric/Behavioral: Negative for behavioral problems and dysphoric mood. The patient is not nervous/anxious.        Objective:   Physical Exam  Constitutional: He appears well-developed and well-nourished. No distress.  Repeat blood pressure 120/82          Assessment & Plan:   ED/decreased libido.  Will check a free T4.  Will give a trial of AndroGel

## 2015-09-14 NOTE — Patient Instructions (Signed)
Call or return to clinic prn if these symptoms worsen or fail to improve as anticipated.

## 2015-09-14 NOTE — Progress Notes (Signed)
Pre visit review using our clinic review tool, if applicable. No additional management support is needed unless otherwise documented below in the visit note. 

## 2015-09-15 ENCOUNTER — Other Ambulatory Visit: Payer: Self-pay | Admitting: Internal Medicine

## 2015-09-15 LAB — TESTOSTERONE, FREE, TOTAL, SHBG
Sex Hormone Binding: 45 nmol/L (ref 22–77)
TESTOSTERONE-% FREE: 1.7 % (ref 1.6–2.9)
Testosterone, Free: 66.1 pg/mL (ref 47.0–244.0)
Testosterone: 397 ng/dL (ref 300–890)

## 2015-12-30 ENCOUNTER — Encounter: Payer: Self-pay | Admitting: Gastroenterology

## 2016-10-24 ENCOUNTER — Other Ambulatory Visit: Payer: Self-pay | Admitting: Orthopedic Surgery

## 2016-10-24 DIAGNOSIS — R2231 Localized swelling, mass and lump, right upper limb: Secondary | ICD-10-CM

## 2016-11-14 ENCOUNTER — Other Ambulatory Visit: Payer: BLUE CROSS/BLUE SHIELD

## 2016-11-16 ENCOUNTER — Other Ambulatory Visit: Payer: BLUE CROSS/BLUE SHIELD

## 2017-04-22 ENCOUNTER — Inpatient Hospital Stay (HOSPITAL_COMMUNITY)
Admission: EM | Admit: 2017-04-22 | Discharge: 2017-04-24 | DRG: 247 | Disposition: A | Discharge status: Home / Self Care | Payer: Medicare Other | Source: Intra-hospital | Attending: Cardiovascular Disease | Admitting: Cardiovascular Disease

## 2017-04-22 ENCOUNTER — Encounter (HOSPITAL_COMMUNITY): Payer: Self-pay | Admitting: Cardiovascular Disease

## 2017-04-22 ENCOUNTER — Inpatient Hospital Stay (HOSPITAL_COMMUNITY)
Admission: EM | Disposition: A | Discharge status: Home / Self Care | Payer: Self-pay | Attending: Cardiovascular Disease

## 2017-04-22 DIAGNOSIS — I959 Hypotension, unspecified: Secondary | ICD-10-CM | POA: Diagnosis present

## 2017-04-22 DIAGNOSIS — I2510 Atherosclerotic heart disease of native coronary artery without angina pectoris: Secondary | ICD-10-CM | POA: Diagnosis not present

## 2017-04-22 DIAGNOSIS — Z955 Presence of coronary angioplasty implant and graft: Secondary | ICD-10-CM

## 2017-04-22 DIAGNOSIS — R001 Bradycardia, unspecified: Secondary | ICD-10-CM | POA: Diagnosis present

## 2017-04-22 DIAGNOSIS — Z87891 Personal history of nicotine dependence: Secondary | ICD-10-CM | POA: Diagnosis not present

## 2017-04-22 DIAGNOSIS — I2119 ST elevation (STEMI) myocardial infarction involving other coronary artery of inferior wall: Secondary | ICD-10-CM | POA: Diagnosis not present

## 2017-04-22 DIAGNOSIS — I2111 ST elevation (STEMI) myocardial infarction involving right coronary artery: Secondary | ICD-10-CM | POA: Diagnosis not present

## 2017-04-22 DIAGNOSIS — Z8249 Family history of ischemic heart disease and other diseases of the circulatory system: Secondary | ICD-10-CM | POA: Diagnosis not present

## 2017-04-22 DIAGNOSIS — M1990 Unspecified osteoarthritis, unspecified site: Secondary | ICD-10-CM | POA: Diagnosis present

## 2017-04-22 DIAGNOSIS — I071 Rheumatic tricuspid insufficiency: Secondary | ICD-10-CM | POA: Diagnosis present

## 2017-04-22 DIAGNOSIS — R404 Transient alteration of awareness: Secondary | ICD-10-CM | POA: Diagnosis not present

## 2017-04-22 DIAGNOSIS — R42 Dizziness and giddiness: Secondary | ICD-10-CM | POA: Diagnosis not present

## 2017-04-22 DIAGNOSIS — I361 Nonrheumatic tricuspid (valve) insufficiency: Secondary | ICD-10-CM | POA: Diagnosis not present

## 2017-04-22 HISTORY — DX: ST elevation (STEMI) myocardial infarction involving right coronary artery: I21.11

## 2017-04-22 HISTORY — DX: Tobacco use: Z72.0

## 2017-04-22 HISTORY — PX: LEFT HEART CATH AND CORONARY ANGIOGRAPHY: CATH118249

## 2017-04-22 HISTORY — PX: CORONARY STENT INTERVENTION: CATH118234

## 2017-04-22 LAB — MRSA PCR SCREENING: MRSA BY PCR: NEGATIVE

## 2017-04-22 LAB — CBC
HEMATOCRIT: 46.7 % (ref 39.0–52.0)
HEMOGLOBIN: 16.1 g/dL (ref 13.0–17.0)
MCH: 31.7 pg (ref 26.0–34.0)
MCHC: 34.5 g/dL (ref 30.0–36.0)
MCV: 91.9 fL (ref 78.0–100.0)
Platelets: 166 10*3/uL (ref 150–400)
RBC: 5.08 MIL/uL (ref 4.22–5.81)
RDW: 13.5 % (ref 11.5–15.5)
WBC: 13 10*3/uL — ABNORMAL HIGH (ref 4.0–10.5)

## 2017-04-22 LAB — COMPREHENSIVE METABOLIC PANEL
ALK PHOS: 56 U/L (ref 38–126)
ALT: 14 U/L — ABNORMAL LOW (ref 17–63)
ANION GAP: 10 (ref 5–15)
AST: 23 U/L (ref 15–41)
Albumin: 3.8 g/dL (ref 3.5–5.0)
BILIRUBIN TOTAL: 2.6 mg/dL — AB (ref 0.3–1.2)
BUN: 15 mg/dL (ref 6–20)
CALCIUM: 9 mg/dL (ref 8.9–10.3)
CO2: 24 mmol/L (ref 22–32)
Chloride: 107 mmol/L (ref 101–111)
Creatinine, Ser: 1.02 mg/dL (ref 0.61–1.24)
GLUCOSE: 102 mg/dL — AB (ref 65–99)
Potassium: 3.8 mmol/L (ref 3.5–5.1)
Sodium: 141 mmol/L (ref 135–145)
TOTAL PROTEIN: 6.6 g/dL (ref 6.5–8.1)

## 2017-04-22 LAB — TROPONIN I
TROPONIN I: 0.76 ng/mL — AB (ref <0.03)
TROPONIN I: 3.98 ng/mL — AB (ref <0.03)

## 2017-04-22 LAB — POCT ACTIVATED CLOTTING TIME: Activated Clotting Time: 142 seconds

## 2017-04-22 SURGERY — LEFT HEART CATH AND CORONARY ANGIOGRAPHY
Anesthesia: LOCAL

## 2017-04-22 MED ORDER — VERAPAMIL HCL 2.5 MG/ML IV SOLN
INTRAVENOUS | Status: AC
  Filled 2017-04-22: qty 2

## 2017-04-22 MED ORDER — SODIUM CHLORIDE 0.9 % IV SOLN: 4.0000 ug/kg/min | INTRAVENOUS | Status: DC

## 2017-04-22 MED ORDER — LABETALOL HCL 5 MG/ML IV SOLN: 10.0000 mg | INTRAVENOUS | Status: AC | PRN

## 2017-04-22 MED ORDER — SODIUM CHLORIDE 0.9 % IV SOLN: 250.0000 mL | INTRAVENOUS | Status: DC | PRN

## 2017-04-22 MED ORDER — IOPAMIDOL (ISOVUE-370) INJECTION 76%
INTRAVENOUS | Status: DC | PRN
  Administered 2017-04-22: 180 mL via INTRA_ARTERIAL

## 2017-04-22 MED ORDER — CANGRELOR TETRASODIUM 50 MG IV SOLR
INTRAVENOUS | Status: AC
  Filled 2017-04-22: qty 50

## 2017-04-22 MED ORDER — CANGRELOR BOLUS VIA INFUSION
INTRAVENOUS | Status: DC | PRN
  Administered 2017-04-22: 2718 ug via INTRAVENOUS

## 2017-04-22 MED ORDER — ONDANSETRON HCL 4 MG/2ML IJ SOLN: 4.0000 mg | Freq: Four times a day (QID) | INTRAMUSCULAR | Status: DC | PRN

## 2017-04-22 MED ORDER — ADENOSINE 12 MG/4ML IV SOLN
INTRAVENOUS | Status: AC
  Filled 2017-04-22: qty 12

## 2017-04-22 MED ORDER — LIDOCAINE HCL (PF) 1 % IJ SOLN
INTRAMUSCULAR | Status: AC
  Filled 2017-04-22: qty 30

## 2017-04-22 MED ORDER — LIDOCAINE HCL (PF) 1 % IJ SOLN
INTRAMUSCULAR | Status: DC | PRN
  Administered 2017-04-22: 20 mL via INTRADERMAL

## 2017-04-22 MED ORDER — SODIUM CHLORIDE 0.9 % IV SOLN: INTRAVENOUS | Status: AC

## 2017-04-22 MED ORDER — ADENOSINE 6 MG/2ML IV SOLN
INTRAVENOUS | Status: DC | PRN
  Administered 2017-04-22: 12 mg via INTRAVENOUS

## 2017-04-22 MED ORDER — SODIUM CHLORIDE 0.9 % IV SOLN
INTRAVENOUS | Status: AC | PRN
  Administered 2017-04-22: 1.75 mg/kg/h via INTRAVENOUS

## 2017-04-22 MED ORDER — IOPAMIDOL (ISOVUE-370) INJECTION 76%
INTRAVENOUS | Status: AC
  Filled 2017-04-22: qty 125

## 2017-04-22 MED ORDER — HEPARIN (PORCINE) IN NACL 2-0.9 UNIT/ML-% IJ SOLN
INTRAMUSCULAR | Status: DC | PRN
  Administered 2017-04-22: 13:00:00

## 2017-04-22 MED ORDER — SODIUM CHLORIDE 0.9 % IV SOLN
INTRAVENOUS | Status: AC | PRN
  Administered 2017-04-22: 999 mL/h via INTRAVENOUS

## 2017-04-22 MED ORDER — METOPROLOL TARTRATE 12.5 MG HALF TABLET
12.5000 mg | ORAL_TABLET | Freq: Two times a day (BID) | ORAL | Status: DC
  Administered 2017-04-22 – 2017-04-24 (×2): 12.5 mg via ORAL
  Filled 2017-04-22 (×3): qty 1

## 2017-04-22 MED ORDER — HEPARIN (PORCINE) IN NACL 2-0.9 UNIT/ML-% IJ SOLN
INTRAMUSCULAR | Status: AC
  Filled 2017-04-22: qty 1000

## 2017-04-22 MED ORDER — BIVALIRUDIN BOLUS VIA INFUSION - CUPID
INTRAVENOUS | Status: DC | PRN
  Administered 2017-04-22: 67.95 mg via INTRAVENOUS

## 2017-04-22 MED ORDER — SODIUM CHLORIDE 0.9 % IV SOLN
INTRAVENOUS | Status: AC | PRN
  Administered 2017-04-22: 4 ug/kg/min via INTRAVENOUS

## 2017-04-22 MED ORDER — DOPAMINE-DEXTROSE 3.2-5 MG/ML-% IV SOLN
INTRAVENOUS | Status: DC | PRN
  Administered 2017-04-22: 20 ug/kg/min via INTRAVENOUS

## 2017-04-22 MED ORDER — SODIUM CHLORIDE 0.9% FLUSH: 3.0000 mL | INTRAVENOUS | Status: DC | PRN

## 2017-04-22 MED ORDER — MORPHINE SULFATE (PF) 4 MG/ML IV SOLN: 2.0000 mg | INTRAVENOUS | Status: DC | PRN

## 2017-04-22 MED ORDER — ACETAMINOPHEN 325 MG PO TABS: 650.0000 mg | ORAL_TABLET | ORAL | Status: DC | PRN

## 2017-04-22 MED ORDER — TICAGRELOR 90 MG PO TABS
ORAL_TABLET | ORAL | Status: DC | PRN
  Administered 2017-04-22: 180 mg via ORAL

## 2017-04-22 MED ORDER — ATROPINE SULFATE 1 MG/10ML IJ SOSY
PREFILLED_SYRINGE | INTRAMUSCULAR | Status: AC
  Filled 2017-04-22: qty 10

## 2017-04-22 MED ORDER — ATORVASTATIN CALCIUM 80 MG PO TABS
80.0000 mg | ORAL_TABLET | Freq: Every day | ORAL | Status: DC
  Administered 2017-04-22 – 2017-04-23 (×2): 80 mg via ORAL
  Filled 2017-04-22 (×2): qty 1

## 2017-04-22 MED ORDER — SODIUM CHLORIDE 0.9% FLUSH
3.0000 mL | Freq: Two times a day (BID) | INTRAVENOUS | Status: DC
  Administered 2017-04-23: 3 mL via INTRAVENOUS

## 2017-04-22 MED ORDER — TICAGRELOR 90 MG PO TABS
90.0000 mg | ORAL_TABLET | Freq: Two times a day (BID) | ORAL | Status: DC
  Administered 2017-04-22 – 2017-04-24 (×4): 90 mg via ORAL
  Filled 2017-04-22 (×4): qty 1

## 2017-04-22 MED ORDER — OXYCODONE-ACETAMINOPHEN 5-325 MG PO TABS: 1.0000 | ORAL_TABLET | ORAL | Status: DC | PRN

## 2017-04-22 MED ORDER — BIVALIRUDIN TRIFLUOROACETATE 250 MG IV SOLR
INTRAVENOUS | Status: AC
  Filled 2017-04-22: qty 250

## 2017-04-22 MED ORDER — IOPAMIDOL (ISOVUE-370) INJECTION 76%
INTRAVENOUS | Status: AC
  Filled 2017-04-22: qty 100

## 2017-04-22 MED ORDER — TICAGRELOR 90 MG PO TABS
ORAL_TABLET | ORAL | Status: AC
  Filled 2017-04-22: qty 2

## 2017-04-22 MED ORDER — HYDRALAZINE HCL 20 MG/ML IJ SOLN
5.0000 mg | INTRAMUSCULAR | Status: AC | PRN
  Filled 2017-04-22: qty 1

## 2017-04-22 MED ORDER — ASPIRIN 81 MG PO CHEW
81.0000 mg | CHEWABLE_TABLET | Freq: Every day | ORAL | Status: DC
  Administered 2017-04-23 – 2017-04-24 (×2): 81 mg via ORAL
  Filled 2017-04-22 (×2): qty 1

## 2017-04-22 SURGICAL SUPPLY — 21 items
BALLN EUPHORA RX 2.5X12 (BALLOONS) ×2
BALLN ~~LOC~~ EMERGE MR 5.0X12 (BALLOONS) ×2
BALLOON EUPHORA RX 2.5X12 (BALLOONS) ×1 IMPLANT
BALLOON ~~LOC~~ EMERGE MR 5.0X12 (BALLOONS) ×1 IMPLANT
CATH EXPO 5F FL3.5 (CATHETERS) ×2 IMPLANT
CATH INFINITI 5FR ANG PIGTAIL (CATHETERS) ×2 IMPLANT
CATH INFINITI 5FR JL4 (CATHETERS) ×2 IMPLANT
CATH LAUNCHER 6FR JR4 (CATHETERS) ×2 IMPLANT
CATH VISTA GUIDE 6FR AL1 (CATHETERS) ×2 IMPLANT
CATH VISTA GUIDE 6FR XBRCA (CATHETERS) ×2 IMPLANT
GLIDESHEATH SLEND SS 6F .021 (SHEATH) ×2 IMPLANT
KIT ENCORE 26 ADVANTAGE (KITS) ×2 IMPLANT
KIT HEART LEFT (KITS) ×2 IMPLANT
PACK CARDIAC CATHETERIZATION (CUSTOM PROCEDURE TRAY) ×2 IMPLANT
SHEATH PINNACLE 6F 10CM (SHEATH) ×2 IMPLANT
STENT RESOLUTE ONYX 5.0X18 (Permanent Stent) ×2 IMPLANT
SYR MEDRAD MARK V 150ML (SYRINGE) ×2 IMPLANT
TRANSDUCER W/STOPCOCK (MISCELLANEOUS) ×2 IMPLANT
TUBING CIL FLEX 10 FLL-RA (TUBING) ×2 IMPLANT
WIRE COUGAR XT STRL 190CM (WIRE) ×2 IMPLANT
WIRE EMERALD 3MM-J .035X150CM (WIRE) ×2 IMPLANT

## 2017-04-22 NOTE — Progress Notes (Signed)
  EKG CRITICAL VALUE     12 lead EKG performed.  Critical value noted.  Karen Kitchens, RN notified.   Radene Gunning, CCT 04/22/2017 3:32 PM

## 2017-04-22 NOTE — Progress Notes (Signed)
eLink Physician-Brief Progress Note Patient Name: Neco Kling DOB: 1951/02/25 MRN: [id number]   Date of Service  04/22/2017  HPI/Events of Note  On 10th hole with STEMI Went to cath lab Await not eon intervention Camera care with normal BP Has a line still fam  eICU Interventions  Interview of pt, no CP sys 151 To dc fem aline asap Monitor further     Intervention Category Evaluation Type: New Patient Evaluation  Raylene Miyamoto. 04/22/2017, 4:13 PM

## 2017-04-22 NOTE — Progress Notes (Signed)
CRITICAL VALUE ALERT  Critical Value: 0.76  Date & Time Notied: Expected Value  Provider Notified:Expected Value  Orders Received/Actions taken: Patient underwent Cardiac cath with PCI after STEMI and this is an expected value. No reporting needed. Will continue to monitor.

## 2017-04-22 NOTE — H&P (Signed)
     Patient ID: Gregory Hale MRN: [id number] DOB/AGE: July 03, 1951 66 y.o. Admit date: 04/22/2017  Primary Care Physician: Marletta Lor, MD Primary Cardiologist: new  HPI: 66 yo male with history of former tobacco abuse, arthritis who was playing golf today. He was on the 10th hole and began to feel weak and became diaphoretic. His friend called EMS. EKG showed inferior ST elevation. Code STEMI called by EMS. Pt arrived in the ED and was diaphoretic and c/o weakness. While in the ED, he had a short loss of consciousness but quickly responded. HR fell to 40 bpm in the ED. He was brought to the cath lab and first BP in the cath lab was 50/20. Pt denied CP, SOB. Only c/o weakness.    Review of systems complete and found to be negative unless listed above   Past Medical History:  Diagnosis Date  . Arthritis   . Tobacco abuse    Quit in 1998    Family History  Problem Relation Age of Onset  . CAD Brother     Social History   Social History  . Marital status: Married    Spouse name: N/A  . Number of children: N/A  . Years of education: N/A   Occupational History  . Not on file.   Social History Main Topics  . Smoking status: Former Research scientist (life sciences)  . Smokeless tobacco: Never Used  . Alcohol use 1.2 - 1.8 oz/week    2 - 3 Cans of beer per week  . Drug use: No  . Sexual activity: Not on file   Other Topics Concern  . Not on file   Social History Narrative  . No narrative on file    Past Surgical History:  Procedure Laterality Date  . parotid Left 1980  . TONSILLECTOMY     as a child    No Known Allergies  Prior to Admission Meds:  Prior to Admission medications   Medication Sig Start Date End Date Taking? Authorizing Provider  Multiple Vitamin (MULTIVITAMIN) tablet Take 1 tablet by mouth daily.    [provider]  sildenafil (VIAGRA) 100 MG tablet Take 1 tablet (100 mg total) by mouth daily as needed for erectile dysfunction. 09/23/14   Marletta Lor,  MD  terbinafine (LAMISIL) 250 MG tablet Take 1 tablet (250 mg total) by mouth daily. 07/11/14   Marletta Lor, MD    Physical Exam: Blood pressure 50/20, pulse 40, resp. rate 16, SpO2 98%    General: Well developed, well nourished, appears uncomfortable, diaphoretic HEENT: OP clear, mucus membranes moist  SKIN: warm, dry. No rashes.  Neuro: No focal deficits  Musculoskeletal: Muscle strength 5/5 all ext  Psychiatric: Mood and affect normal  Neck: No JVD, no carotid bruits, no thyromegaly, no lymphadenopathy.  Lungs:Clear bilaterally, no wheezes, rhonci, crackles  Cardiovascular: Regular rate and rhythm. No murmurs, gallops or rubs.  Abdomen:Soft. Bowel sounds present. Non-tender.  Extremities: No lower extremity edema. Faint distal pulses.   Labs: pending  EKG: Sinus, 1 mm inferior ST elevation  ASSESSMENT AND PLAN:   1. Acute inferior STEMI: EKG suggests acute inferior MI. Pt with hypotension, diaphoresis and bradycardia. Plan emergent cardiac cath with PCI.   Darlina Guys, MD 04/22/2017, 2:29 PM

## 2017-04-22 NOTE — Progress Notes (Signed)
Pt educated on Femoral sheath removal. Questions answered. Sheath removed at 1719. Manual pressure held for 15 min until hemostasis achieved. Afterwards site level 0, BLE equally warm, pink, dry, cap refull < 2 secs, DP 2+. Site dressed with gauze. Pt and wife educated on precautions and need to report bleeding, change in leg sensation, back pain. Pt reports understanding. VSS. Pt tolerated well.

## 2017-04-23 ENCOUNTER — Inpatient Hospital Stay (HOSPITAL_COMMUNITY): Payer: Medicare Other

## 2017-04-23 DIAGNOSIS — I361 Nonrheumatic tricuspid (valve) insufficiency: Secondary | ICD-10-CM

## 2017-04-23 LAB — LIPID PANEL
Cholesterol: 149 mg/dL (ref 0–200)
HDL: 47 mg/dL (ref >40)
LDL CALC: 90 mg/dL (ref 0–99)
TRIGLYCERIDES: 61 mg/dL (ref <150)
Total CHOL/HDL Ratio: 3.2 RATIO
VLDL: 12 mg/dL (ref 0–40)

## 2017-04-23 LAB — CBC
HEMATOCRIT: 44.8 % (ref 39.0–52.0)
Hemoglobin: 15.1 g/dL (ref 13.0–17.0)
MCH: 31.2 pg (ref 26.0–34.0)
MCHC: 33.7 g/dL (ref 30.0–36.0)
MCV: 92.6 fL (ref 78.0–100.0)
Platelets: 167 10*3/uL (ref 150–400)
RBC: 4.84 MIL/uL (ref 4.22–5.81)
RDW: 13.8 % (ref 11.5–15.5)
WBC: 11.7 10*3/uL — ABNORMAL HIGH (ref 4.0–10.5)

## 2017-04-23 LAB — BASIC METABOLIC PANEL
Anion gap: 9 (ref 5–15)
BUN: 18 mg/dL (ref 6–20)
CALCIUM: 8.6 mg/dL — AB (ref 8.9–10.3)
CO2: 24 mmol/L (ref 22–32)
Chloride: 106 mmol/L (ref 101–111)
Creatinine, Ser: 0.96 mg/dL (ref 0.61–1.24)
GFR calc Af Amer: >60 mL/min (ref >60)
GLUCOSE: 106 mg/dL — AB (ref 65–99)
POTASSIUM: 3.5 mmol/L (ref 3.5–5.1)
Sodium: 139 mmol/L (ref 135–145)

## 2017-04-23 LAB — ECHOCARDIOGRAM COMPLETE: WEIGHTICAEL: 2948.87 [oz_av]

## 2017-04-23 LAB — TROPONIN I: Troponin I: 9.03 ng/mL (ref <0.03)

## 2017-04-23 NOTE — Care Management Note (Signed)
Case Management Note Marvetta Gibbons RN, BSN Unit 4E-Case Manager-- [place] coverage [phone]  Patient Details  Name: Gregory Hale MRN: [id number] Date of Birth: 08-04-1951  Subjective/Objective:    Pt admitted with STEMI s/p DES- pt has been started on Brilinta                Action/Plan: PTA pt lived at home with wife- independent- spoke with pt and wife at bedside regarding brilinta- chart shows self pay- per pt/wife- pt has medicare insurance- wife supplied cards along with part D coverage- copies made and faxed to admitting.  Will submit for benefits check which can not be done until Monday AM- pt given card for 30 day free to use on discharge. Pharmacy that they plan to use is [facility] on [address]. CM will follow up with pt tomorrow regarding copay cost.   Expected Discharge Date:                  Expected Discharge Plan:  Home/Self Care  In-House Referral:     Discharge planning Services  CM Consult, Medication Assistance  Post Acute Care Choice:  NA Choice offered to:     DME Arranged:    DME Agency:     HH Arranged:    HH Agency:     Status of Service:  In process, will continue to follow  If discussed at Long Length of Stay Meetings, dates discussed:    Discharge Disposition:   Additional Comments:  Dawayne Patricia, RN 04/23/2017, 10:47 AM

## 2017-04-23 NOTE — Progress Notes (Signed)
Progress Note  Patient Name: Gregory Hale Date of Encounter: 04/23/2017  Primary Cardiologist: Dr. Darlina Guys  Subjective   No chest pain or shortness of breath. No palpitations. No nausea.  Inpatient Medications    Scheduled Meds: . aspirin  81 mg Oral Daily  . atorvastatin  80 mg Oral q1800  . metoprolol tartrate  12.5 mg Oral BID  . sodium chloride flush  3 mL Intravenous Q12H  . ticagrelor  90 mg Oral BID   Continuous Infusions: . sodium chloride     PRN Meds: sodium chloride, acetaminophen, morphine injection, ondansetron (ZOFRAN) IV, oxyCODONE-acetaminophen, sodium chloride flush   Vital Signs    Vitals:   04/23/17 0400 04/23/17 0500 04/23/17 0600 04/23/17 0700  BP: 118/77 110/71 116/76 106/80  Pulse: (!) 51 (!) 52 (!) 54 (!) 51  Resp: 12 15 13 12   Temp:      TempSrc:      SpO2: 96% 97% 98% 98%  Weight:        Intake/Output Summary (Last 24 hours) at 04/23/17 0725 Last data filed at 04/23/17 0600  Gross per 24 hour  Intake           426.27 ml  Output              600 ml  Net          -173.73 ml   Filed Weights   04/22/17 1500  Weight: 184 lb 4.9 oz (83.6 kg)    Telemetry    Sinus rhythm and sinus bradycardia. Brief burst NSVT on 7/28. Personally reviewed.  ECG    Tracing from 04/23/2017 shows sinus bradycardia with low voltage in limb leads, inferior infarct pattern. Personally reviewed.  Physical Exam   GEN: No acute distress.   Neck: No JVD. Cardiac: RRR, no murmur, rub, or gallop.  Respiratory: Nonlabored. Clear to auscultation bilaterally. GI: Soft, nontender, bowel sounds present. MS: No edema; No deformity. Neuro:  Nonfocal. Psych: Alert and oriented x 3. Normal affect.  Labs    Chemistry Recent Labs Lab 04/22/17 1837 04/23/17 0310  NA 141 139  K 3.8 3.5  CL 107 106  CO2 24 24  GLUCOSE 102* 106*  BUN 15 18  CREATININE 1.02 0.96  CALCIUM 9.0 8.6*  PROT 6.6  --   ALBUMIN 3.8  --   AST 23  --   ALT 14*  --     ALKPHOS 56  --   BILITOT 2.6*  --   GFRNONAA >60 >60  GFRAA >60 >60  ANIONGAP 10 9     Hematology Recent Labs Lab 04/22/17 1837 04/23/17 0310  WBC 13.0* 11.7*  RBC 5.08 4.84  HGB 16.1 15.1  HCT 46.7 44.8  MCV 91.9 92.6  MCH 31.7 31.2  MCHC 34.5 33.7  RDW 13.5 13.8  PLT 166 167    Cardiac Enzymes Recent Labs Lab 04/22/17 1837 04/22/17 2219 04/23/17 0310  TROPONINI 0.76* 3.98* 9.03*   No results for input(s): TROPIPOC in the last 168 hours.   Radiology    No results found.  Cardiac Studies   Cardiac catheterization and PCI 04/22/2017:  Mid LAD lesion, 30 %stenosed.  Ost 2nd Diag lesion, 70 %stenosed.  A STENT RESOLUTE ONYX 5.0X18 drug eluting stent was successfully placed.  Prox RCA lesion, 100 %stenosed.  Post intervention, there is a 0% residual stenosis.  The left ventricular systolic function is normal.  LV end diastolic pressure is normal.  The left ventricular ejection fraction is  greater than 65% by visual estimate.  There is no mitral valve regurgitation.   1. Acute inferior STEMI secondary to thrombotic occlusion of the mid RCA 2. Successful PTCA/DES x 1 mid RCA 3. Mild disease in the mid LAD 4. Moderate disease in the ostium of the moderate caliber Diagonal branch 5. Normal LV systolic function  Patient Profile     66 y.o. male with prior history of tobacco use, admitted on 728 with inferior STEMI, now status post placement of DES to the mid RCA for treatment of the culprit lesion. LVEF 65% by ventriculography with no significant mitral regurgitation.  Assessment & Plan    1. Inferior STEMI on 7/28 status post DES for treatment of thrombotic occlusion of the mid RCA. Peak troponin I 3.98. Residual disease includes moderate diagonal stenosis which will be managed medically at this point. LVEF 65% at ventriculography. Follow-up echocardiogram pending.  2. Tobacco use in remission.  3. LDL 90 and HDL 47 by follow-up FLP, currently  placed on high-dose Lipitor.  Normal progression with cardiac rehabilitation anticipated. Follow-up echocardiogram today. Continue aspirin, Lopressor, Lipitor, and Brilinta.  Signed, Rozann Lesches, MD  04/23/2017, 7:25 AM

## 2017-04-23 NOTE — Progress Notes (Signed)
  Echocardiogram 2D Echocardiogram has been performed.  Donata Clay 04/23/2017, 2:23 PM

## 2017-04-24 ENCOUNTER — Telehealth: Payer: Self-pay | Admitting: Cardiovascular Disease

## 2017-04-24 ENCOUNTER — Encounter (HOSPITAL_COMMUNITY): Payer: Self-pay | Admitting: Cardiovascular Disease

## 2017-04-24 DIAGNOSIS — Z955 Presence of coronary angioplasty implant and graft: Secondary | ICD-10-CM

## 2017-04-24 LAB — BASIC METABOLIC PANEL
Anion gap: 7 (ref 5–15)
BUN: 15 mg/dL (ref 6–20)
CO2: 26 mmol/L (ref 22–32)
CREATININE: 0.9 mg/dL (ref 0.61–1.24)
Calcium: 8.6 mg/dL — ABNORMAL LOW (ref 8.9–10.3)
Chloride: 105 mmol/L (ref 101–111)
GFR calc Af Amer: >60 mL/min (ref >60)
Glucose, Bld: 106 mg/dL — ABNORMAL HIGH (ref 65–99)
POTASSIUM: 3.7 mmol/L (ref 3.5–5.1)
SODIUM: 138 mmol/L (ref 135–145)

## 2017-04-24 LAB — CBC
HCT: 44.1 % (ref 39.0–52.0)
Hemoglobin: 14.8 g/dL (ref 13.0–17.0)
MCH: 31.3 pg (ref 26.0–34.0)
MCHC: 33.6 g/dL (ref 30.0–36.0)
MCV: 93.2 fL (ref 78.0–100.0)
PLATELETS: 145 10*3/uL — AB (ref 150–400)
RBC: 4.73 MIL/uL (ref 4.22–5.81)
RDW: 13.8 % (ref 11.5–15.5)
WBC: 9.1 10*3/uL (ref 4.0–10.5)

## 2017-04-24 LAB — POCT ACTIVATED CLOTTING TIME: ACTIVATED CLOTTING TIME: 334 s

## 2017-04-24 MED ORDER — ASPIRIN 81 MG PO CHEW: 81.0000 mg | CHEWABLE_TABLET | Freq: Every day | ORAL | Status: DC

## 2017-04-24 MED ORDER — METOPROLOL TARTRATE 25 MG PO TABS: 12.5000 mg | ORAL_TABLET | Freq: Two times a day (BID) | ORAL | 2 refills | Status: DC

## 2017-04-24 MED ORDER — DOPAMINE-DEXTROSE 3.2-5 MG/ML-% IV SOLN
INTRAVENOUS | Status: AC
  Filled 2017-04-24: qty 250

## 2017-04-24 MED ORDER — TICAGRELOR 90 MG PO TABS: 90.0000 mg | ORAL_TABLET | Freq: Two times a day (BID) | ORAL | 10 refills | Status: DC

## 2017-04-24 MED ORDER — ATORVASTATIN CALCIUM 80 MG PO TABS: 80.0000 mg | ORAL_TABLET | Freq: Every day | ORAL | 1 refills | Status: DC

## 2017-04-24 MED ORDER — NITROGLYCERIN 0.4 MG SL SUBL: 0.4000 mg | SUBLINGUAL_TABLET | SUBLINGUAL | 3 refills | Status: DC | PRN

## 2017-04-24 MED ORDER — TICAGRELOR 90 MG PO TABS: 90.0000 mg | ORAL_TABLET | Freq: Two times a day (BID) | ORAL | 0 refills | Status: DC

## 2017-04-24 MED FILL — Verapamil HCl IV Soln 2.5 MG/ML: INTRAVENOUS | Qty: 2 | Status: AC

## 2017-04-24 NOTE — Telephone Encounter (Signed)
Call placed to Pt.  Pt just got home from hospital.  Notified I would call Pt tomorrow to check in.  Pt indicates understanding.

## 2017-04-24 NOTE — Discharge Summary (Signed)
Discharge Summary    Patient ID: Gregory Hale,  MRN: [id number], DOB/AGE: 65/02/1951 67 y.o.  Admit date: 04/22/2017 Discharge date: 04/24/2017  Primary Care Provider: Marletta Lor Primary Cardiologist: Angelena Form  Discharge Diagnoses    Active Problems:   Acute ST elevation myocardial infarction (STEMI) of inferior wall ([place])   Allergies No Known Allergies  Diagnostic Studies/Procedures    LHC: 04/22/17  Conclusion     Mid LAD lesion, 30 %stenosed.  Ost 2nd Diag lesion, 70 %stenosed.  A STENT RESOLUTE ONYX 5.0X18 drug eluting stent was successfully placed.  Prox RCA lesion, 100 %stenosed.  Post intervention, there is a 0% residual stenosis.  The left ventricular systolic function is normal.  LV end diastolic pressure is normal.  The left ventricular ejection fraction is greater than 65% by visual estimate.  There is no mitral valve regurgitation.   1. Acute inferior STEMI secondary to thrombotic occlusion of the mid RCA 2. Successful PTCA/DES x 1 mid RCA 3. Mild disease in the mid LAD 4. Moderate disease in the ostium of the moderate caliber Diagonal branch 5. Normal LV systolic function  Recommendations: Will admit to ICU on telemetry. Will continue Cangrelor for 30 minutes post Brilinta. Will then use ASA, Brilinta for one year. Will start high dose statin and low dose beta blocker. Echo tomorrow.    TTE: 04/23/17  Study Conclusions  - Left ventricle: The cavity size was normal. Wall thickness was   increased in a pattern of mild LVH. Systolic function was normal.   The estimated ejection fraction was in the range of 60% to 65%.   Wall motion was normal; there were no regional wall motion   abnormalities. Doppler parameters are consistent with abnormal   left ventricular relaxation (grade 1 diastolic dysfunction). - Aortic root: The aortic root was mildly dilated. - Ascending aorta: The ascending aorta was mildly dilated. - Mitral valve:  There was trivial regurgitation. - Right atrium: Central venous pressure (est): 3 mm Hg. - Atrial septum: No defect or patent foramen ovale was identified. - Tricuspid valve: There was mild regurgitation. - Pulmonary arteries: PA peak pressure: 24 mm Hg (S). - Pericardium, extracardiac: There was no pericardial effusion.  Impressions:  - Mild LVH with LVEF 60-65% and grade 1 diastolic dysfunction.   Trivial mitral regurgitation. Mildly dilated aortic root and   ascending aorta. Mild tricuspid regurgitation with PASP 24 mmHg. _____________   History of Present Illness     66 yo male with history of former tobacco abuse, arthritis who was playing golf the day of admissio. He was on the 10th hole and began to feel weak and became diaphoretic. His friend called EMS. EKG showed inferior ST elevation. Code STEMI called by EMS. Pt arrived in the ED and was diaphoretic and c/o weakness. While in the ED, he had a short loss of consciousness but quickly responded. HR fell to 40 bpm in the ED. He was brought to the cath lab and first BP in the cath lab was 50/20. Pt denied CP, SOB. Only c/o weakness.   Hospital Course     Underwent LHC with Dr. Angelena Form noted above with PCI/DES x1 to the [facility]. He was continued on Cangrelor briefly post cath. Plan for DAPT with ASA/Brilinta for at least one year. Trop peaked at 9.03. Started on low dose metoprolol 12.5mg  BID and blood pressure tolerated well. Also added high dose statin. LDL 90, cholesterol 149. He was transitioned to telemetry on 04/23/17. Worked  well with cardiac rehab with no recurrent chest pain reported. Follow up echo showed normal LV function with no WMA.   He was seen by Dr. Irish Lack and determined stable for discharge home. Follow up in the office has been arranged. Medications are listed below.  _____________  Discharge Vitals Blood pressure 125/81, pulse (!) 54, temperature 98.4 F (36.9 C), temperature source Oral, resp. rate (!) 22,  height 5\' 10"  (1.778 m), weight 185 lb 1 oz (83.9 kg), SpO2 98 %.  Filed Weights   04/22/17 1500 04/24/17 0724  Weight: 184 lb 4.9 oz (83.6 kg) 185 lb 1 oz (83.9 kg)    Labs & Radiologic Studies    CBC  Recent Labs  04/23/17 0310 04/24/17 0322  WBC 11.7* 9.1  HGB 15.1 14.8  HCT 44.8 44.1  MCV 92.6 93.2  PLT 167 465*   Basic Metabolic Panel  Recent Labs  04/23/17 0310 04/24/17 0322  NA 139 138  K 3.5 3.7  CL 106 105  CO2 24 26  GLUCOSE 106* 106*  BUN 18 15  CREATININE 0.96 0.90  CALCIUM 8.6* 8.6*   Liver Function Tests  Recent Labs  04/22/17 1837  AST 23  ALT 14*  ALKPHOS 56  BILITOT 2.6*  PROT 6.6  ALBUMIN 3.8   No results for input(s): LIPASE, AMYLASE in the last 72 hours. Cardiac Enzymes  Recent Labs  04/22/17 1837 04/22/17 2219 04/23/17 0310  TROPONINI 0.76* 3.98* 9.03*   BNP Invalid input(s): POCBNP D-Dimer No results for input(s): DDIMER in the last 72 hours. Hemoglobin A1C No results for input(s): HGBA1C in the last 72 hours. Fasting Lipid Panel  Recent Labs  04/23/17 0310  CHOL 149  HDL 47  LDLCALC 90  TRIG 61  CHOLHDL 3.2   Thyroid Function Tests No results for input(s): TSH, T4TOTAL, T3FREE, THYROIDAB in the last 72 hours.  Invalid input(s): FREET3 _____________  No results found. Disposition   Pt is being discharged home today in good condition.  Follow-up Plans & Appointments    Follow-up Information    Charlie Pitter, PA-C Follow up on 05/02/2017.   Specialties:  Cardiology, Radiology Why:  at 10:30 am for your follow up appt.  Contact information: [address] [place] [address] [place] [address] [phone]          Discharge Instructions    Amb Referral to Cardiac Rehabilitation    Complete by:  As directed    Diagnosis:   Coronary Stents STEMI PTCA     Call MD for:  redness, tenderness, or signs of infection (pain, swelling, redness, odor or green/yellow discharge around incision site)     Complete by:  As directed    Diet - low sodium heart healthy    Complete by:  As directed    Discharge instructions    Complete by:  As directed    Groin Site Care Refer to this sheet in the next few weeks. These instructions provide you with information on caring for yourself after your procedure. Your caregiver may also give you more specific instructions. Your treatment has been planned according to current medical practices, but problems sometimes occur. Call your caregiver if you have any problems or questions after your procedure. HOME CARE INSTRUCTIONS You may shower 24 hours after the procedure. Remove the bandage (dressing) and gently wash the site with plain soap and water. Gently pat the site dry.  Do not apply powder or lotion to the site.  Do not  sit in a bathtub, swimming pool, or whirlpool for 5 to 7 days.  No bending, squatting, or lifting anything over 10 pounds (4.5 kg) as directed by your caregiver.  Inspect the site at least twice daily.  Do not drive home if you are discharged the same day of the procedure. Have someone else drive you.  You may drive 24 hours after the procedure unless otherwise instructed by your caregiver.  What to expect: Any bruising will usually fade within 1 to 2 weeks.  Blood that collects in the tissue (hematoma) may be painful to the touch. It should usually decrease in size and tenderness within 1 to 2 weeks.  SEEK IMMEDIATE MEDICAL CARE IF: You have unusual pain at the groin site or down the affected leg.  You have redness, warmth, swelling, or pain at the groin site.  You have drainage (other than a small amount of blood on the dressing).  You have chills.  You have a fever or persistent symptoms for more than 72 hours.  You have a fever and your symptoms suddenly get worse.  Your leg becomes pale, cool, tingly, or numb.  You have heavy bleeding from the site. Hold pressure on the site. .  No lifting over 10 lbs for 2 weeks. No sexual  activity for 1 week. You may not return to work until cleared by your cardiologist. Keep procedure site clean & dry. If you notice increased pain, swelling, bleeding or pus, call/return!  You may shower, but no soaking baths/hot tubs/pools for 1 week.   PLEASE DO NOT MISS ANY DOSES OF YOUR BRILINTA!!!!! Also check your blood pressures at home and bring to your follow up appt. Please call the office with any questions   Increase activity slowly    Complete by:  As directed       Discharge Medications   Current Discharge Medication List    START taking these medications   Details  aspirin 81 MG chewable tablet Chew 1 tablet (81 mg total) by mouth daily. Qty: 30 tablet    atorvastatin (LIPITOR) 80 MG tablet Take 1 tablet (80 mg total) by mouth daily at 6 PM. Qty: 90 tablet, Refills: 1    metoprolol tartrate (LOPRESSOR) 25 MG tablet Take 0.5 tablets (12.5 mg total) by mouth 2 (two) times daily. Qty: 60 tablet, Refills: 2    nitroGLYCERIN (NITROSTAT) 0.4 MG SL tablet Place 1 tablet (0.4 mg total) under the tongue every 5 (five) minutes as needed. Qty: 25 tablet, Refills: 3    ticagrelor (BRILINTA) 90 MG TABS tablet Take 1 tablet (90 mg total) by mouth 2 (two) times daily. Qty: 60 tablet, Refills: 10      STOP taking these medications     sildenafil (VIAGRA) 100 MG tablet      terbinafine (LAMISIL) 250 MG tablet          Aspirin prescribed at discharge?  Yes High Intensity Statin Prescribed? (Lipitor 40-80mg  or Crestor 20-40mg ): Yes Beta Blocker Prescribed? Yes For EF <40%, was ACEI/ARB Prescribed? No: EF ok ADP Receptor Inhibitor Prescribed? (i.e. Plavix etc.-Includes Medically Managed Patients): Yes For EF <40%, Aldosterone Inhibitor Prescribed? No: EF ok Was EF assessed during THIS hospitalization? Yes Was Cardiac Rehab II ordered? (Included Medically managed Patients): Yes   Outstanding Labs/Studies   FLP/LFTs in 6 weeks if tolerating statin.   Duration of Discharge  Encounter   Greater than 30 minutes including physician time.  Signed, Reino Bellis NP-C 04/24/2017, 11:34 AM  I have examined the patient and reviewed assessment and plan and discussed with patient.  Agree with above as stated.  RRR, S1S2; 2+ right femoral pulse.  2+ right PT pulse.  No edema.  Right groin dressing removed.  No lifting more than 10 lbs for a week.  He will do cardiac rehab.  Continue DAPT along with aggressive secondary prevention.   We discussed his travel plans.  I stressed the importance of avoiding lifting to prevent groin bleeding.    Larae Grooms

## 2017-04-24 NOTE — Telephone Encounter (Signed)
New message    TOC appt made on 05/02/17 at 1030am with Dayna Dunn per Ria Comment.

## 2017-04-24 NOTE — Care Management Note (Signed)
Case Management Note Marvetta Gibbons RN, BSN Unit 4E-Case Manager-- [place] coverage [phone]  Patient Details  Name: Jaeden Messer MRN: [id number] Date of Birth: 1951-07-10  Subjective/Objective:    Pt admitted with STEMI s/p DES- pt has been started on Brilinta                Action/Plan: PTA pt lived at home with wife- independent- spoke with pt and wife at bedside regarding brilinta- chart shows self pay- per pt/wife- pt has medicare insurance- wife supplied cards along with part D coverage- copies made and faxed to admitting.  Will submit for benefits check which can not be done until Monday AM- pt given card for 30 day free to use on discharge. [facility] [address]. CM will follow up with pt tomorrow regarding copay cost.   Expected Discharge Date:  04/24/17               Expected Discharge Plan:  Home/Self Care  In-House Referral:     Discharge planning Services  CM Consult, Medication Assistance  Post Acute Care Choice:  NA Choice offered to:     DME Arranged:    DME Agency:     HH Arranged:    HH Agency:     Status of Service:  Completed, signed off  If discussed at [employer] of Stay Meetings, dates discussed:    Discharge Disposition: home/self care   Additional Comments:  04/24/17 1200- Marvetta Gibbons RN, CM-  Insurance check completed for [employer]- pt has not met deductible- copay $373.10- tier 3 drug-  Spoke with pt and wife- info shared at the bedside along with copay cost for Plavix as alternate drug. Once deductible met pt will pay 47% cost of drug per wife-  Pt will plan to use 30 day free card on discharge- [facility] [address] and they do have drug in stock to fill today.   Dawayne Patricia, RN 04/24/2017, 12:06 PM

## 2017-04-24 NOTE — Progress Notes (Signed)
Pt given discharge instructions and verbalized understanding.  Pt DC home.

## 2017-04-24 NOTE — Progress Notes (Signed)
CARDIAC REHAB PHASE I   PRE:  Rate/Rhythm: 61 SR    BP: sitting 134/86    SaO2:   MODE:  Ambulation: 690 ft   POST:  Rate/Rhythm: 81 SR    BP: sitting 157/83     SaO2:   Tolerated well, no c/o, quick pace. Feels his normal self. BP slightly elevated. Ed completed with pt and wife, good reception. Understands importance of Brilinta. Needs copay card. Will refer to [place]. Anxious to d/c.  [place], ACSM 04/24/2017 9:13 AM

## 2017-04-24 NOTE — Progress Notes (Signed)
Per Clinical biochemist for [employer] # 2.  S/W RACHEL @ [employer] [facility] # [phone]   BRILINTA 90 MG BID   COVER- YES  CO-PAY- $ 373.10  DEDUCTIBLE NOT MET  TIER- 3 DRUG  PRIOR APPROVAL- NO   ALTERNATIVE :  CLOPIDOGREL 75 MG BID  COVER- YES  CO-PAY- $ 3.00  PRIOR APPROVAL- YES # [id number] ( bid will need a p/a )   CLOPIDOGREL  75 MG DAILY  COVER- YES  CO-PAY- $ 3.00  TIER- 2 DRUG  PRIOR APPROVAL-NO   PREFERRED PHARMACY : [facility]

## 2017-04-25 ENCOUNTER — Telehealth (HOSPITAL_COMMUNITY): Payer: Self-pay

## 2017-04-25 NOTE — Telephone Encounter (Signed)
Patient insurance is active and benefits verified. Patient insurance is Medicare A/B - no co-pay, deductible $183.00/$0 has been met, 20% co-insurance, no out of pocket and no pre-authorization. Passport/reference [phone].  Patient will be contacted and scheduled after their follow up appointment with the cardiologist on 05/02/17, upon review by [facility] RN navigator.

## 2017-04-25 NOTE — Telephone Encounter (Signed)
Patient contacted regarding discharge from [facility] on 04/24/2017.  Patient understands to follow up with provider Melina Copa on 05/02/2017 at 64 at [facility] office. Patient understands discharge instructions? yes Patient understands medications and regiment? yes Patient understands to bring all medications to this visit? yes  Pt states he is doing well.  Per Pt he has all his new medications.  Pt asked about splitting metoprolol, states it doesn't split evenly.  Stated to split pill daily and then use both halves that day to ensure getting 25 mg daily.  Pt indicates understanding.  Pt states he is tolerating medication well.  No side effects at this time.  Instructed Pt to call office if he had any concerns.

## 2017-05-01 ENCOUNTER — Encounter: Payer: Self-pay | Admitting: Physician Assistant

## 2017-05-01 ENCOUNTER — Telehealth: Payer: Self-pay | Admitting: *Deleted

## 2017-05-01 DIAGNOSIS — I77810 Thoracic aortic ectasia: Secondary | ICD-10-CM | POA: Insufficient documentation

## 2017-05-01 DIAGNOSIS — I2510 Atherosclerotic heart disease of native coronary artery without angina pectoris: Secondary | ICD-10-CM | POA: Insufficient documentation

## 2017-05-01 DIAGNOSIS — I2111 ST elevation (STEMI) myocardial infarction involving right coronary artery: Secondary | ICD-10-CM | POA: Insufficient documentation

## 2017-05-01 NOTE — Telephone Encounter (Signed)
I spoke to patient to see if he is interested in the [employer]-[facility] Study. I spoke to patient while in the hospital last Monday. Patient stated he had not had a chance to read the consent. I told him I understood. I asked him to call me back if he was interested and he agreed.

## 2017-05-01 NOTE — Progress Notes (Addendum)
Cardiology Office Note    Date:  05/02/2017  ID:  Gregory Hale, DOB 09-02-1951, MRN [id number] PCP:  Marletta Lor, MD  Cardiologist: Dr. Angelena Form   Chief Complaint: f/u MI  History of Present Illness:  Gregory Hale is a 66 y.o. male (originally from [place], has lived here 9 years) with history of former tobacco abuse, arthritis, recently diagnosed CAD s/p DES to RCA, mildly dilated aortic root and ascending aorta who presents for post-hospital follow-up. Recently admitted 04/22/17 with weakness/diaphoresis while on the golf course - found to have inferior STEMI in the ED with profound hypotension to 50/20 and bradycardia with HR of 40 associated with brief LOC. He underwent cardiac cath showing thrombotic occlusion of RCA treated with DEs, otherwise mild disease in LAD, moderate disease in ostium of the moderate caliber diagonal branch, normal LVEF >65%. 2D Echo showed mild LVH, EF 60-65%, grade 1 DD, mildly dilated aortic root and ascending aorta, mild TR. He was able to be stared on standard post MI therapy including beta blocker and statin. Labs showed LDL 90, trig 61, Hgb 15.1, K 3.5, Cr 0.96, LFTs ok except Tbili 2.6 (prev elevated in 2011). Plan is for ASA/Brilinta x 1 year.  He returns for follow-up today doing well. No CP, SOB, diaphoresis, dizziness, palpitations or syncope. Tolerating meds without difficulty. Has been following BP and noticing occasional outliers to 850-277 systolic. Eager to get back into playing golf.    Past Medical History:  Diagnosis Date  . Arthritis   . Ascending aorta dilatation (HCC)   . Bradycardia    a. HR 40 in setting of acute inferior STEMI 03/2017.  Marland Kitchen CAD in native artery    a. inf STEMI 03/2017 a/w hypotension and bradycardia -> s/p DES to RCA, otherwise mild disease in LAD, moderate disease in ostium of the moderate caliber diagonal branch, normal LVEF >65%  . Dilated aortic root ([place])   . STEMI involving right coronary artery ([place])    04/22/17 PCI/DES to mRCA, normal EF  . Tobacco abuse    Quit in 1998    Past Surgical History:  Procedure Laterality Date  . CORONARY STENT INTERVENTION N/A 04/22/2017   Procedure: Coronary Stent Intervention;  Surgeon: [name], MD;  Location: [place] [facility];  Service: Cardiovascular;  Laterality: N/A;  . LEFT HEART CATH AND CORONARY ANGIOGRAPHY N/A 04/22/2017   Procedure: Left Heart Cath and Coronary Angiography;  Surgeon: [name], MD;  Location: [place] [facility];  Service: Cardiovascular;  Laterality: N/A;  . parotid Left 1980  . TONSILLECTOMY     as a child    Current Medications: Current Meds  Medication Sig  . aspirin 81 MG chewable tablet Chew 1 tablet (81 mg total) by mouth daily.  Marland Kitchen atorvastatin (LIPITOR) 80 MG tablet Take 1 tablet (80 mg total) by mouth daily at 6 PM.  . metoprolol tartrate (LOPRESSOR) 25 MG tablet Take 0.5 tablets (12.5 mg total) by mouth 2 (two) times daily.  . nitroGLYCERIN (NITROSTAT) 0.4 MG SL tablet Place 1 tablet (0.4 mg total) under the tongue every 5 (five) minutes as needed.  . ticagrelor (BRILINTA) 90 MG TABS tablet Take 1 tablet (90 mg total) by mouth 2 (two) times daily.     Allergies:   Patient has no known allergies.   Social History   Social History  . Marital status: Married    Spouse name: N/A  . Number of children: N/A  . Years of education:  N/A   Social History Main Topics  . Smoking status: Former Research scientist (life sciences)  . Smokeless tobacco: Never Used  . Alcohol use 1.2 - 1.8 oz/week    2 - 3 Cans of beer per week  . Drug use: No  . Sexual activity: Not Asked   Other Topics Concern  . None   Social History Narrative  . None     Family History:  Family History  Problem Relation Age of Onset  . CAD Brother     ROS:   Please see the history of present illness All other systems are reviewed and otherwise negative.    PHYSICAL EXAM:   VS:  BP 134/80   Pulse (!) 57   Ht 5\' 10"  (1.778 m)   Wt  191 lb (86.6 kg)   SpO2 98%   BMI 27.41 kg/m   BMI: Body mass index is 27.41 kg/m. GEN: Well nourished, [name], fit appearing, in no acute distress  HEENT: normocephalic, atraumatic Neck: no JVD, carotid bruits, or masses Cardiac: RRR; no murmurs, rubs, or gallops, no edema  Respiratory:  clear to auscultation bilaterally, normal work of breathing GI: soft, nontender, nondistended, + BS MS: no deformity or atrophy  Skin: warm and dry, no rash. Right groin cath site without hematoma or bruit; mild ecchymosis which appears in moderate stages of resolution with yellowing Neuro:  Alert and Oriented x 3, Strength and sensation are intact, follows commands Psych: euthymic mood, full affect  Wt Readings from Last 3 Encounters:  05/02/17 191 lb (86.6 kg)  04/24/17 185 lb 1 oz (83.9 kg)  09/14/15 200 lb (90.7 kg)      Studies/Labs Reviewed:   EKG:  EKG was ordered today and personally reviewed by me and demonstrates sinus bradycardia 57bpm, no acute ST-T changes  Recent Labs: 04/22/2017: ALT 14 04/24/2017: BUN 15; Creatinine, Ser 0.90; Hemoglobin 14.8; Platelets 145; Potassium 3.7; Sodium 138   Lipid Panel    Component Value Date/Time   CHOL 149 04/23/2017 0310   CHOL 179 06/24/2011   TRIG 61 04/23/2017 0310   TRIG 63 06/24/2011   HDL 47 04/23/2017 0310   CHOLHDL 3.2 04/23/2017 0310   VLDL 12 04/23/2017 0310   LDLCALC 90 04/23/2017 0310   LDLCALC 112 06/24/2011    Additional studies/ records that were reviewed today include: Summarized above.    ASSESSMENT & PLAN:   1. CAD - doing very well s/p PCI. Patient and wife had lots of questions, tried my best to answer them all. Discussed no lifting over 10lbs for 2 weeks as instructed by discharge summary. Also discussed avoiding extreme heat. Continue ASA, BB, statin, Brilinta. Add losartan for HTN as below. He inquired whether or not he could travel to [place] in a week. I think since he is feeling well this is  reasonable, with frequent stops in the car every 2 hours (they're splitting up the trip). Also encouraged him to research local medical facility to determine where they would go in the event of an emergency. He is interested in cardiac rehab; encouraged participation in the program. Their last notes indicated they would contact him after this visit. Lastly his wife inquired about prostaglandin-medication that he is receiving via injection periodically for ED by his urologist. I asked him to discuss potential risks with urologist in light of his recent diagnosis of CAD. 2. Mildly dilated aortic root and ascending aorta - timing of f/u monitoring per primary cardiologist, consider repeat echo annually. 3. Former  tobacco abuse - congratulated him on continued abstinence. 4. Bradycardia - suspect due to acute MI/RCA occlusion, resolved. Tolerating BB without difficulty. 5. Essential HTN - most recent BPs have been creeping up. Start losartan 25mg  daily. Repeat BMET when he returns from his trip to [place]. Asked him to contact office if BP continues to tend to run 478 systolic or greater.  Disposition: F/u with Dr. Angelena Form in 3 months.  Medication Adjustments/Labs and Tests Ordered: Current medicines are reviewed at length with the patient today.  Concerns regarding medicines are outlined above. Medication changes, Labs and Tests ordered today are summarized above and listed in the Patient Instructions accessible in Encounters.   Signed, Charlie Pitter, PA-C  05/02/2017 10:56 AM    [address], [place], [place]  [address] Phone: [phone]; Fax: [phone]

## 2017-05-02 ENCOUNTER — Encounter: Payer: Self-pay | Admitting: Physician Assistant

## 2017-05-02 ENCOUNTER — Ambulatory Visit (INDEPENDENT_AMBULATORY_CARE_PROVIDER_SITE_OTHER): Payer: Medicare Other | Admitting: Physician Assistant

## 2017-05-02 VITALS — BP 134/80 | HR 57 | Ht 70.0 in | Wt 191.0 lb

## 2017-05-02 DIAGNOSIS — I10 Essential (primary) hypertension: Secondary | ICD-10-CM | POA: Diagnosis not present

## 2017-05-02 DIAGNOSIS — I2510 Atherosclerotic heart disease of native coronary artery without angina pectoris: Secondary | ICD-10-CM

## 2017-05-02 DIAGNOSIS — R001 Bradycardia, unspecified: Secondary | ICD-10-CM | POA: Diagnosis not present

## 2017-05-02 DIAGNOSIS — I77810 Thoracic aortic ectasia: Secondary | ICD-10-CM

## 2017-05-02 DIAGNOSIS — Z87891 Personal history of nicotine dependence: Secondary | ICD-10-CM | POA: Diagnosis not present

## 2017-05-02 MED ORDER — LOSARTAN POTASSIUM 25 MG PO TABS: 25.0000 mg | ORAL_TABLET | Freq: Every day | ORAL | 3 refills | Status: DC

## 2017-05-02 NOTE — Patient Instructions (Signed)
Medication Instructions: Your physician has recommended you make the following change in your medication:  -1) START Losartan Potassium - Take 1 tablet (25 mg) by mouth daily   Labwork: Your physician recommends that you return for lab work in approx. 2 weeks (after you return form your trip on the 21st) - BMET  Your physician recommends that you return for lab work in 6 weeks for CMET and Lipids - YOU WILL NEED TO BE FASTING FOR THESE LABS  Procedures/Testing: None Ordered  Follow-Up: Your physician recommends that you schedule a follow-up appointment in 3 MONTHS with Dr. Angelena Form.   Any Additional Special Instructions Will Be Listed Below (If Applicable).     If you need a refill on your cardiac medications before your next appointment, please call your pharmacy.

## 2017-05-03 ENCOUNTER — Ambulatory Visit (INDEPENDENT_AMBULATORY_CARE_PROVIDER_SITE_OTHER): Payer: Medicare Other | Admitting: Internal Medicine

## 2017-05-03 ENCOUNTER — Encounter: Payer: Self-pay | Admitting: Internal Medicine

## 2017-05-03 VITALS — BP 122/62 | HR 69 | Temp 97.9°F | Ht 70.0 in | Wt 191.2 lb

## 2017-05-03 DIAGNOSIS — E039 Hypothyroidism, unspecified: Secondary | ICD-10-CM

## 2017-05-03 DIAGNOSIS — I10 Essential (primary) hypertension: Secondary | ICD-10-CM | POA: Insufficient documentation

## 2017-05-03 DIAGNOSIS — I2119 ST elevation (STEMI) myocardial infarction involving other coronary artery of inferior wall: Secondary | ICD-10-CM

## 2017-05-03 NOTE — Patient Instructions (Signed)
Limit your sodium (Salt) intake    It is important that you exercise regularly, at least 20 minutes 3 to 4 times per week.  If you develop chest pain or shortness of breath seek  medical attention.  Cardiology follow-up as scheduled  Return in one year for follow-up

## 2017-05-03 NOTE — Progress Notes (Signed)
Subjective:    Patient ID: Mabel Unrein, male    DOB: October 25, 1950, 66 y.o.   MRN: [id number]  HPI  Admit date: 04/22/2017 Discharge date: 04/24/2017  Primary Care Provider: Marletta Lor Primary Cardiologist: Angelena Form  Discharge Diagnoses    Active Problems:   Acute ST elevation myocardial infarction (STEMI) of inferior wall [facility])  66 year old patient who is seen following a recent hospital discharge for treatment of STEMI of inferior wall  and  for transitional care management.   Since his discharge, he has done quite well.  He was seen by cardiology yesterday and losartan 25 mg daily added to his regimen due to persistent high blood pressure readings following discharge. Remains asymptomatic. Medical regimen also includes high intensity statin therapy as well as DAPT.  Past Medical History:  Diagnosis Date  . Arthritis   . Ascending aorta dilatation (HCC)   . Bradycardia    a. HR 40 in setting of acute inferior STEMI 03/2017.  Marland Kitchen CAD in native artery    a. inf STEMI 03/2017 a/w hypotension and bradycardia -> s/p DES to RCA, otherwise mild disease in LAD, moderate disease in ostium of the moderate caliber diagonal branch, normal LVEF >65%  . Dilated aortic root ([place])   . STEMI involving right coronary artery ([place])    04/22/17 PCI/DES to mRCA, normal EF  . Tobacco abuse    Quit in 1998     Social History   Social History  . Marital status: Married    Spouse name: N/A  . Number of children: N/A  . Years of education: N/A   Occupational History  . Not on file.   Social History Main Topics  . Smoking status: Former Research scientist (life sciences)  . Smokeless tobacco: Never Used  . Alcohol use 1.2 - 1.8 oz/week    2 - 3 Cans of beer per week  . Drug use: No  . Sexual activity: Not on file   Other Topics Concern  . Not on file   Social History Narrative  . No narrative on file    Past Surgical History:  Procedure Laterality Date  . CORONARY STENT INTERVENTION N/A 04/22/2017     Procedure: Coronary Stent Intervention;  Surgeon: [name], MD;  Location: [facility];  Service: Cardiovascular;  Laterality: N/A;  . LEFT HEART CATH AND CORONARY ANGIOGRAPHY N/A 04/22/2017   Procedure: Left Heart Cath and Coronary Angiography;  Surgeon: [name], MD;  Location: [facility];  Service: Cardiovascular;  Laterality: N/A;  . parotid Left 1980  . TONSILLECTOMY     as a child    Family History  Problem Relation Age of Onset  . CAD Brother     No Known Allergies  Current Outpatient Prescriptions on File Prior to Visit  Medication Sig Dispense Refill  . aspirin 81 MG chewable tablet Chew 1 tablet (81 mg total) by mouth daily. 30 tablet   . atorvastatin (LIPITOR) 80 MG tablet Take 1 tablet (80 mg total) by mouth daily at 6 PM. 90 tablet 1  . losartan (COZAAR) 25 MG tablet Take 1 tablet (25 mg total) by mouth daily. 90 tablet 3  . metoprolol tartrate (LOPRESSOR) 25 MG tablet Take 0.5 tablets (12.5 mg total) by mouth 2 (two) times daily. 60 tablet 2  . nitroGLYCERIN (NITROSTAT) 0.4 MG SL tablet Place 1 tablet (0.4 mg total) under the tongue every 5 (five) minutes as needed. 25 tablet 3  . ticagrelor (BRILINTA) 90 MG  TABS tablet Take 1 tablet (90 mg total) by mouth 2 (two) times daily. 60 tablet 10   No current facility-administered medications on file prior to visit.     BP 122/62 (BP Location: Left Arm, Patient Position: Sitting, Cuff Size: Normal)   Pulse 69   Temp 97.9 F (36.6 C) (Oral)   Ht 5\' 10"  (1.778 m)   Wt 191 lb 3.2 oz (86.7 kg)   SpO2 99%   BMI 27.43 kg/m      Review of Systems  Constitutional: Negative for appetite change, chills, fatigue and fever.  HENT: Negative for congestion, dental problem, ear pain, hearing loss, sore throat, tinnitus, trouble swallowing and voice change.   Eyes: Negative for pain, discharge and visual disturbance.  Respiratory: Negative for cough, chest tightness, wheezing and  stridor.   Cardiovascular: Negative for chest pain, palpitations and leg swelling.  Gastrointestinal: Negative for abdominal distention, abdominal pain, blood in stool, constipation, diarrhea, nausea and vomiting.  Genitourinary: Negative for difficulty urinating, discharge, flank pain, genital sores, hematuria and urgency.  Musculoskeletal: Negative for arthralgias, back pain, gait problem, joint swelling, myalgias and neck stiffness.  Skin: Negative for rash.  Neurological: Negative for dizziness, syncope, speech difficulty, weakness, numbness and headaches.  Hematological: Negative for adenopathy. Does not bruise/bleed easily.  Psychiatric/Behavioral: Negative for behavioral problems and dysphoric mood. The patient is not nervous/anxious.        Objective:   Physical Exam  Constitutional: He is oriented to person, place, and time. He appears well-developed.  Blood pressure 126/70  HENT:  Head: Normocephalic.  Right Ear: External ear normal.  Left Ear: External ear normal.  Eyes: Conjunctivae and EOM are normal.  Neck: Normal range of motion.  Cardiovascular: Normal rate and normal heart sounds.   Pulmonary/Chest: Breath sounds normal.  Abdominal: Bowel sounds are normal.  Musculoskeletal: Normal range of motion. He exhibits no edema or tenderness.  Neurological: He is alert and oriented to person, place, and time.  Psychiatric: He has a normal mood and affect. His behavior is normal.          Assessment & Plan:   Status post STEMI inferior wall.  Continue present regimen and close cardiology follow-up Essential hypertension.  Presently stable  Continue DAPT for 1 year Patient will slowly resume his usual activities.  Nyoka Cowden

## 2017-05-11 ENCOUNTER — Telehealth (HOSPITAL_COMMUNITY): Payer: Self-pay

## 2017-05-11 NOTE — Telephone Encounter (Signed)
I called and left message on voicemail to call office about scheduling for cardiac rehab. I left office contact information on patient voicemail to return call.  ° °

## 2017-05-22 ENCOUNTER — Other Ambulatory Visit: Payer: Medicare Other | Admitting: *Deleted

## 2017-05-22 DIAGNOSIS — R001 Bradycardia, unspecified: Secondary | ICD-10-CM | POA: Diagnosis not present

## 2017-05-22 DIAGNOSIS — I77810 Thoracic aortic ectasia: Secondary | ICD-10-CM

## 2017-05-22 DIAGNOSIS — I2510 Atherosclerotic heart disease of native coronary artery without angina pectoris: Secondary | ICD-10-CM | POA: Diagnosis not present

## 2017-05-22 LAB — COMPREHENSIVE METABOLIC PANEL
A/G RATIO: 1.8 (ref 1.2–2.2)
ALK PHOS: 78 IU/L (ref 39–117)
ALT: 18 IU/L (ref 0–44)
AST: 19 IU/L (ref 0–40)
Albumin: 4 g/dL (ref 3.6–4.8)
BILIRUBIN TOTAL: 1.4 mg/dL — AB (ref 0.0–1.2)
BUN / CREAT RATIO: 11 (ref 10–24)
BUN: 11 mg/dL (ref 8–27)
CHLORIDE: 100 mmol/L (ref 96–106)
CO2: 25 mmol/L (ref 20–29)
Calcium: 8.9 mg/dL (ref 8.6–10.2)
Creatinine, Ser: 0.99 mg/dL (ref 0.76–1.27)
GFR calc non Af Amer: 80 mL/min/{1.73_m2} (ref >59)
GFR, EST AFRICAN AMERICAN: 92 mL/min/{1.73_m2} (ref >59)
GLOBULIN, TOTAL: 2.2 g/dL (ref 1.5–4.5)
GLUCOSE: 97 mg/dL (ref 65–99)
POTASSIUM: 3.9 mmol/L (ref 3.5–5.2)
SODIUM: 142 mmol/L (ref 134–144)
TOTAL PROTEIN: 6.2 g/dL (ref 6.0–8.5)

## 2017-05-22 LAB — LIPID PANEL
Chol/HDL Ratio: 2 ratio (ref 0.0–5.0)
Cholesterol, Total: 106 mg/dL (ref 100–199)
HDL: 53 mg/dL (ref >39)
LDL Calculated: 45 mg/dL (ref 0–99)
TRIGLYCERIDES: 40 mg/dL (ref 0–149)
VLDL CHOLESTEROL CAL: 8 mg/dL (ref 5–40)

## 2017-05-23 DIAGNOSIS — N5201 Erectile dysfunction due to arterial insufficiency: Secondary | ICD-10-CM | POA: Diagnosis not present

## 2017-05-30 ENCOUNTER — Telehealth (HOSPITAL_COMMUNITY): Payer: Self-pay

## 2017-05-31 NOTE — Telephone Encounter (Signed)
Cardiac Rehab Medication Review by a Pharmacist  Does the patient  feel that his/her medications are working for him/her?  yes  Has the patient been experiencing any side effects to the medications prescribed?  no  Does the patient measure his/her own blood pressure or blood glucose at home?  yes   Does the patient have any problems obtaining medications due to transportation or finances?   no  Understanding of regimen: good Understanding of indications: good Potential of compliance: excellent   Pharmacist comments: Patient endorses adherence to all medications. No reported issues. He measures his BP at home daily, which usually runs in the 130s/80s mmHg.   Leroy Libman, PharmD Pharmacy Resident Pager: [phone] 05/31/2017 4:10 PM

## 2017-06-01 ENCOUNTER — Encounter (HOSPITAL_COMMUNITY)
Admission: RE | Admit: 2017-06-01 | Discharge: 2017-06-01 | Disposition: A | Discharge status: Home / Self Care | Payer: Medicare Other | Source: Ambulatory Visit | Attending: Cardiovascular Disease | Admitting: Cardiovascular Disease

## 2017-06-01 ENCOUNTER — Encounter (HOSPITAL_COMMUNITY): Payer: Self-pay

## 2017-06-01 ENCOUNTER — Telehealth: Payer: Self-pay | Admitting: *Deleted

## 2017-06-01 VITALS — BP 110/62 | HR 59 | Ht 70.0 in | Wt 191.1 lb

## 2017-06-01 DIAGNOSIS — I2111 ST elevation (STEMI) myocardial infarction involving right coronary artery: Secondary | ICD-10-CM

## 2017-06-01 DIAGNOSIS — Z955 Presence of coronary angioplasty implant and graft: Secondary | ICD-10-CM | POA: Insufficient documentation

## 2017-06-01 DIAGNOSIS — I2119 ST elevation (STEMI) myocardial infarction involving other coronary artery of inferior wall: Secondary | ICD-10-CM | POA: Insufficient documentation

## 2017-06-01 NOTE — Telephone Encounter (Signed)
I spoke with pt and gave him information below from Dr. Angelena Form.     From: [name], MD  Sent: 06/01/2017  1:11 PM   Subject: RE: ?Parameters for Cardiac Rehab         Pat, Can we let him know that he can lift any weights and that there are no restrictions on exercise or BP parameters. His ascending aorta is only mildly dilated at the root. He should only use NTG sublingually if he has chest pressure. If he has recurrence of profound weakness/dizziness and sweating like he had with his MI, he should seek medical attention.

## 2017-06-01 NOTE — Progress Notes (Signed)
Keshav Winegar 66 y.o. male DOB: 10-18-1950 MRN: [id number]      Nutrition Note  Dx: MI s/p DES RCA Past Medical History:  Diagnosis Date  . Arthritis   . Ascending aorta dilatation (HCC)   . Bradycardia    a. HR 40 in setting of acute inferior STEMI 03/2017.  Marland Kitchen CAD in native artery    a. inf STEMI 03/2017 a/w hypotension and bradycardia -> s/p DES to RCA, otherwise mild disease in LAD, moderate disease in ostium of the moderate caliber diagonal branch, normal LVEF >65%  . Dilated aortic root ([place])   . STEMI involving right coronary artery ([place])    04/22/17 PCI/DES to mRCA, normal EF  . Tobacco abuse    Quit in [place] reviewed.  HT: Ht Readings from Last 1 Encounters:  05/03/17 5\' 10"  (1.778 m)    WT: Wt Readings from Last 3 Encounters:  05/03/17 191 lb 3.2 oz (86.7 kg)  05/02/17 191 lb (86.6 kg)  04/24/17 185 lb 1 oz (83.9 kg)     BMI 27.4   Current tobacco use? No  Labs:  Lipid Panel     Component Value Date/Time   CHOL 106 05/22/2017 0814   CHOL 179 06/24/2011   TRIG 40 05/22/2017 0814   TRIG 63 06/24/2011   HDL 53 05/22/2017 0814   CHOLHDL 2.0 05/22/2017 0814   CHOLHDL 3.2 04/23/2017 0310   VLDL 12 04/23/2017 0310   LDLCALC 45 05/22/2017 0814    Lab Results  Component Value Date   HGBA1C 5.4 06/05/2015   CBG (last 3)  No results for input(s): GLUCAP in the last 72 hours.  Nutrition Note Spoke with pt. Nutrition plan and goals reviewed with pt. Pt is following Step 1 of the Therapeutic Lifestyle Changes diet. Pt wants to lose wt. Pt reports he has lost 40 lb over the past 9 years "not even trying." Pt reports he did increase his golfing and walking once he moved to [place] 9 years ago. Pt states his job involved a lot of traveling and he recently retired (~2 months ago). Wt loss tips reviewed. Pt expressed understanding of the information reviewed. Pt aware of nutrition education classes offered and plans on attending nutrition classes.  Nutrition  Diagnosis ? Food-and nutrition-related knowledge deficit related to lack of exposure to information as related to diagnosis of: ? CVD ? Overweight related to excessive energy intake as evidenced by a BMI of 27.4  Nutrition Intervention ? Pt's individual nutrition plan and goals reviewed with pt.  Nutrition Goal(s):  ? Pt to identify and limit food sources of saturated fat, trans fat, and sodium ? Pt to identify food quantities necessary to achieve weight loss of 6-15 lb at graduation from cardiac rehab. Goal wt of 175 lb desired.   Plan:  Pt to attend nutrition classes ? Nutrition I ? Nutrition II ? Portion Distortion  Will provide client-centered nutrition education as part of interdisciplinary care.   Monitor and evaluate progress toward nutrition goal with team.  Derek Mound, M.Ed, RD, LDN, CDE 06/01/2017 9:02 AM

## 2017-06-01 NOTE — Progress Notes (Signed)
Cardiac Individual Treatment Plan  Patient Details  Name: Gregory Hale MRN: [id number] Date of Birth: 1951/07/24 Referring Provider:     CARDIAC REHAB PHASE II ORIENTATION from 06/01/2017 in [place]  Referring Provider  Lauree Chandler MD      Initial Encounter Date:    CARDIAC REHAB PHASE II ORIENTATION from 06/01/2017 in [place]  Date  06/01/17  Referring Provider  Lauree Chandler MD      Visit Diagnosis: ST elevation myocardial infarction involving right coronary artery [facility])  Stented coronary artery  Patient's Home Medications on Admission:  Current Outpatient Prescriptions:  .  aspirin 81 MG chewable tablet, Chew 1 tablet (81 mg total) by mouth daily., Disp: 30 tablet, Rfl:  .  atorvastatin (LIPITOR) 80 MG tablet, Take 1 tablet (80 mg total) by mouth daily at 6 PM., Disp: 90 tablet, Rfl: 1 .  losartan (COZAAR) 25 MG tablet, Take 1 tablet (25 mg total) by mouth daily., Disp: 90 tablet, Rfl: 3 .  metoprolol tartrate (LOPRESSOR) 25 MG tablet, Take 0.5 tablets (12.5 mg total) by mouth 2 (two) times daily., Disp: 60 tablet, Rfl: 2 .  nitroGLYCERIN (NITROSTAT) 0.4 MG SL tablet, Place 1 tablet (0.4 mg total) under the tongue every 5 (five) minutes as needed., Disp: 25 tablet, Rfl: 3 .  ticagrelor (BRILINTA) 90 MG TABS tablet, Take 1 tablet (90 mg total) by mouth 2 (two) times daily., Disp: 60 tablet, Rfl: 10  Past Medical History: Past Medical History:  Diagnosis Date  . Arthritis   . Ascending aorta dilatation (HCC)   . Bradycardia    a. HR 40 in setting of acute inferior STEMI 03/2017.  [place] CAD in native artery    a. inf STEMI 03/2017 a/w hypotension and bradycardia -> s/p DES to RCA, otherwise mild disease in LAD, moderate disease in ostium of the moderate caliber diagonal branch, normal LVEF >65%  . Dilated aortic root ([place])   . STEMI involving right coronary artery ([place])    04/22/17 PCI/DES to mRCA,  normal EF  . Tobacco abuse    Quit in 1998    Tobacco Use: History  Smoking Status  . Former Smoker  Smokeless Tobacco  . Never Used    Labs: Recent [employer] for ITP Cardiac and Pulmonary Rehab Latest Ref Rng & Units 06/24/2011 05/27/2014 06/05/2015 04/23/2017 05/22/2017   Cholestrol 100 - 199 mg/dL 179 174 161 149 106   LDLCALC 0 - 99 mg/dL 112 104 93 90 45   HDL >39 mg/dL 54 56 59 47 53   Trlycerides 0 - 149 mg/dL 63 70 44 61 40   Hemoglobin A1c 4.0 - 6.0 % 5.4 5.1 5.4 - -      Capillary Blood Glucose: No results found for: GLUCAP   Exercise Target Goals: Date: 06/01/17  Exercise Program Goal: Individual exercise prescription set with THRR, safety & activity barriers. Participant demonstrates ability to understand and report RPE using BORG scale, to self-measure pulse accurately, and to acknowledge the importance of the exercise prescription.  Exercise Prescription Goal: Starting with aerobic activity 30 plus minutes a day, 3 days per week for initial exercise prescription. Provide home exercise prescription and guidelines that participant acknowledges understanding prior to discharge.  Activity Barriers & Risk Stratification:     Activity Barriers & Cardiac Risk Stratification - 06/01/17 1119      Activity Barriers & Cardiac Risk Stratification   Activity Barriers  Back Problems;Arthritis   Cardiac Risk Stratification High      6 Minute Walk:     6 Minute Walk    Row Name 06/01/17 1118         6 Minute Walk   Phase Initial     Distance 1978 feet     Walk Time 6 minutes     # of Rest Breaks 0     MPH 3.7     METS 4.18     RPE 9     VO2 Peak 14.66     Symptoms No     Resting HR 59 bpm     Resting BP 110/62     Resting Oxygen Saturation  100 %     Exercise Oxygen Saturation  during 6 min walk 100 %     Max Ex. HR 78 bpm     Max Ex. BP 130/80     2 Minute Post BP 112/70        Oxygen Initial Assessment:   Oxygen  Re-Evaluation:   Oxygen Discharge (Final Oxygen Re-Evaluation):   Initial Exercise Prescription:     Initial Exercise Prescription - 06/01/17 1100      Date of Initial Exercise RX and Referring Provider   Date 06/01/17   Referring Provider Lauree Chandler MD     Treadmill   MPH 3.2   Grade 1   Minutes 10   METs 3.89     Bike   Level 1   Minutes 10   METs 3.1     NuStep   Level 4   SPM 90   Minutes 10   METs 3     Prescription Details   Frequency (times per week) 3   Duration Progress to 30 minutes of continuous aerobic without signs/symptoms of physical distress     Intensity   THRR 40-80% of Max Heartrate 62-124   Ratings of Perceived Exertion 11-13   Perceived Dyspnea 0-4     Progression   Progression Continue to progress workloads to maintain intensity without signs/symptoms of physical distress.     Resistance Training   Training Prescription Yes   Weight 5lbs   Reps 10-15      Perform Capillary Blood Glucose checks as needed.  Exercise Prescription Changes:   Exercise Comments:   Exercise Goals and Review:     Exercise Goals    Row Name 06/01/17 1123 06/01/17 1124           Exercise Goals   Increase Physical Activity Yes  -      Intervention Provide advice, education, support and counseling about physical activity/exercise needs.;Develop an individualized exercise prescription for aerobic and resistive training based on initial evaluation findings, risk stratification, comorbidities and participant's personal goals.  -      Expected Outcomes Achievement of increased cardiorespiratory fitness and enhanced flexibility, muscular endurance and strength shown through measurements of functional capacity and personal statement of participant.  -      Increase Strength and Stamina Yes -  return to golf      Intervention Provide advice, education, support and counseling about physical activity/exercise needs.;Develop an individualized  exercise prescription for aerobic and resistive training based on initial evaluation findings, risk stratification, comorbidities and participant's personal goals.  -      Expected Outcomes Achievement of increased cardiorespiratory fitness and enhanced flexibility, muscular endurance and strength shown through measurements of functional capacity and personal statement of participant.  -  Able to understand and use rate of perceived exertion (RPE) scale Yes  -      Intervention Provide education and explanation on how to use RPE scale  -      Expected Outcomes Short Term: Able to use RPE daily in rehab to express subjective intensity level;Long Term:  Able to use RPE to guide intensity level when exercising independently  -      Knowledge and understanding of Target Heart Rate Range (THRR) Yes  -      Intervention Provide education and explanation of THRR including how the numbers were predicted and where they are located for reference  -      Expected Outcomes Short Term: Able to state/look up THRR;Short Term: Able to use daily as guideline for intensity in rehab;Long Term: Able to use THRR to govern intensity when exercising independently  -      Able to check pulse independently Yes  -      Intervention Provide education and demonstration on how to check pulse in carotid and radial arteries.;Review the importance of being able to check your own pulse for safety during independent exercise  -      Expected Outcomes Short Term: Able to explain why pulse checking is important during independent exercise;Long Term: Able to check pulse independently and accurately  -      Understanding of Exercise Prescription Yes  -      Intervention Provide education, explanation, and written materials on patient's individual exercise prescription  -      Expected Outcomes Short Term: Able to explain program exercise prescription;Long Term: Able to explain home exercise prescription to exercise independently  -          Exercise Goals Re-Evaluation :    Discharge Exercise Prescription (Final Exercise Prescription Changes):   Nutrition:  Target Goals: Understanding of nutrition guidelines, daily intake of sodium 1500mg , cholesterol 200mg , calories 30% from fat and 7% or less from saturated fats, daily to have 5 or more servings of fruits and vegetables.  Biometrics:     Pre Biometrics - 06/01/17 1123      Pre Biometrics   Waist Circumference 37 inches   Hip Circumference 41 inches   Waist to Hip Ratio 0.9 %   Triceps Skinfold 15 mm   % Body Fat 25.7 %   Grip Strength 48 kg   Flexibility 16 in   Single Leg Stand 20 seconds       Nutrition Therapy Plan and Nutrition Goals:     Nutrition Therapy & Goals - 06/01/17 1041      Nutrition Therapy   Diet Therapeutic Lifestyle Changes     Personal Nutrition Goals   Nutrition Goal Pt to identify and limit food sources of saturated fat, trans fat, and sodium   Personal Goal #2 Pt to identify food quantities necessary to achieve weight loss of 6-15 lb at graduation from cardiac rehab. Goal wt of 175 lb desired.      Intervention Plan   Intervention Prescribe, educate and counsel regarding individualized specific dietary modifications aiming towards targeted core components such as weight, hypertension, lipid management, diabetes, heart failure and other comorbidities.   Expected Outcomes Short Term Goal: Understand basic principles of dietary content, such as calories, fat, sodium, cholesterol and nutrients.;Long Term Goal: Adherence to prescribed nutrition plan.      Nutrition Discharge: Nutrition Scores:     Nutrition Assessments - 06/01/17 1041      MEDFICTS Scores  Pre Score 52      Nutrition Goals Re-Evaluation:   Nutrition Goals Re-Evaluation:   Nutrition Goals Discharge (Final Nutrition Goals Re-Evaluation):   Psychosocial: Target Goals: Acknowledge presence or absence of significant depression and/or stress,  maximize coping skills, provide positive support system. Participant is able to verbalize types and ability to use techniques and skills needed for reducing stress and depression.  Initial Review & Psychosocial Screening:     Initial Psych Review & Screening - 06/01/17 1229      Initial Review   Current issues with Current Stress Concerns;Current Anxiety/Panic   Source of Stress Concerns Chronic Illness   Comments Recently retired 2 months ago, health related anxiety due to the sudden onset of his cardiac event     Family Dynamics   Good Support System? Yes     Barriers   Psychosocial barriers to participate in program The patient should benefit from training in stress management and relaxation.      Quality of Life Scores:     Quality of Life - 06/01/17 1125      Quality of Life Scores   Health/Function Pre 26 %   Socioeconomic Pre 29.17 %   Psych/Spiritual Pre 27.43 %   Family Pre 30 %   GLOBAL Post 27.48 %      PHQ-9: Recent Review Flowsheet Data    Depression screen [facility] 2/9 06/23/2014   Decreased Interest 0   Down, Depressed, Hopeless 0   PHQ - 2 Score 0     Interpretation of Total Score  Total Score Depression Severity:  1-4 = Minimal depression, 5-9 = Mild depression, 10-14 = Moderate depression, 15-19 = Moderately severe depression, 20-27 = Severe depression   Psychosocial Evaluation and Intervention:   Psychosocial Re-Evaluation:   Psychosocial Discharge (Final Psychosocial Re-Evaluation):   Vocational Rehabilitation: Provide vocational rehab assistance to qualifying candidates.   Vocational Rehab Evaluation & Intervention:     Vocational Rehab - 06/01/17 1234      Initial Vocational Rehab Evaluation & Intervention   Assessment shows need for Vocational Rehabilitation No      Education: Education Goals: Education classes will be provided on a weekly basis, covering required topics. Participant will state understanding/return demonstration of  topics presented.  Learning Barriers/Preferences:     Learning Barriers/Preferences - 06/01/17 1117      Learning Barriers/Preferences   Learning Barriers None   Learning Preferences Skilled Demonstration      Education Topics: Count Your Pulse:  -Group instruction provided by verbal instruction, demonstration, patient participation and written materials to support subject.  Instructors address importance of being able to find your pulse and how to count your pulse when at home without a heart monitor.  Patients get hands on experience counting their pulse with staff help and individually.   Heart Attack, Angina, and Risk Factor Modification:  -Group instruction provided by verbal instruction, video, and written materials to support subject.  Instructors address signs and symptoms of angina and heart attacks.    Also discuss risk factors for heart disease and how to make changes to improve heart health risk factors.   Functional Fitness:  -Group instruction provided by verbal instruction, demonstration, patient participation, and written materials to support subject.  Instructors address safety measures for doing things around the house.  Discuss how to get up and down off the floor, how to pick things up properly, how to safely get out of a chair without assistance, and balance training.   Meditation and  Mindfulness:  -Group instruction provided by verbal instruction, patient participation, and written materials to support subject.  Instructor addresses importance of mindfulness and meditation practice to help reduce stress and improve awareness.  Instructor also leads participants through a meditation exercise.    Stretching for Flexibility and Mobility:  -Group instruction provided by verbal instruction, patient participation, and written materials to support subject.  Instructors lead participants through series of stretches that are designed to increase flexibility thus improving  mobility.  These stretches are additional exercise for major muscle groups that are typically performed during regular warm up and cool down.   Hands Only CPR:  -Group verbal, video, and participation provides a basic overview of AHA guidelines for community CPR. Role-play of emergencies allow participants the opportunity to practice calling for help and chest compression technique with discussion of AED use.   Hypertension: -Group verbal and written instruction that provides a basic overview of hypertension including the most recent diagnostic guidelines, risk factor reduction with self-care instructions and medication management.    Nutrition I class: Heart Healthy Eating:  -Group instruction provided by PowerPoint slides, verbal discussion, and written materials to support subject matter. The instructor gives an explanation and review of the Therapeutic Lifestyle Changes diet recommendations, which includes a discussion on lipid goals, dietary fat, sodium, fiber, plant stanol/sterol esters, sugar, and the components of a well-balanced, healthy diet.   Nutrition II class: Lifestyle Skills:  -Group instruction provided by PowerPoint slides, verbal discussion, and written materials to support subject matter. The instructor gives an explanation and review of label reading, grocery shopping for heart health, heart healthy recipe modifications, and ways to make healthier choices when eating out.   Diabetes Question & Answer:  -Group instruction provided by PowerPoint slides, verbal discussion, and written materials to support subject matter. The instructor gives an explanation and review of diabetes co-morbidities, pre- and post-prandial blood glucose goals, pre-exercise blood glucose goals, signs, symptoms, and treatment of hypoglycemia and hyperglycemia, and foot care basics.   Diabetes Blitz:  -Group instruction provided by PowerPoint slides, verbal discussion, and written materials to  support subject matter. The instructor gives an explanation and review of the physiology behind type 1 and type 2 diabetes, diabetes medications and rational behind using different medications, pre- and post-prandial blood glucose recommendations and Hemoglobin A1c goals, diabetes diet, and exercise including blood glucose guidelines for exercising safely.    Portion Distortion:  -Group instruction provided by PowerPoint slides, verbal discussion, written materials, and food models to support subject matter. The instructor gives an explanation of serving size versus portion size, changes in portions sizes over the last 20 years, and what consists of a serving from each food group.   Stress Management:  -Group instruction provided by verbal instruction, video, and written materials to support subject matter.  Instructors review role of stress in heart disease and how to cope with stress positively.     Exercising on Your Own:  -Group instruction provided by verbal instruction, power point, and written materials to support subject.  Instructors discuss benefits of exercise, components of exercise, frequency and intensity of exercise, and end points for exercise.  Also discuss use of nitroglycerin and activating EMS.  Review options of places to exercise outside of rehab.  Review guidelines for sex with heart disease.   Cardiac Drugs I:  -Group instruction provided by verbal instruction and written materials to support subject.  Instructor reviews cardiac drug classes: antiplatelets, anticoagulants, beta blockers, and statins.  Instructor discusses reasons,  side effects, and lifestyle considerations for each drug class.   Cardiac Drugs II:  -Group instruction provided by verbal instruction and written materials to support subject.  Instructor reviews cardiac drug classes: angiotensin converting enzyme inhibitors (ACE-I), angiotensin II receptor blockers (ARBs), nitrates, and calcium channel  blockers.  Instructor discusses reasons, side effects, and lifestyle considerations for each drug class.   Anatomy and Physiology of the Circulatory System:  Group verbal and written instruction and models provide basic cardiac anatomy and physiology, with the coronary electrical and arterial systems. Review of: AMI, Angina, Valve disease, Heart Failure, Peripheral Artery Disease, Cardiac Arrhythmia, Pacemakers, and the ICD.   Other Education:  -Group or individual verbal, written, or video instructions that support the educational goals of the cardiac rehab program.   Knowledge Questionnaire Score:     Knowledge Questionnaire Score - 06/01/17 1118      Knowledge Questionnaire Score   Pre Score 19/24      Core Components/Risk Factors/Patient Goals at Admission:     Personal Goals and Risk Factors at Admission - 06/01/17 1124      Core Components/Risk Factors/Patient Goals on Admission   Hypertension Yes   Intervention Provide education on lifestyle modifcations including regular physical activity/exercise, weight management, moderate sodium restriction and increased consumption of fresh fruit, vegetables, and low fat dairy, alcohol moderation, and smoking cessation.;Monitor prescription use compliance.   Expected Outcomes Short Term: Continued assessment and intervention until BP is < 140/55mm HG in hypertensive participants. < 130/82mm HG in hypertensive participants with diabetes, heart failure or chronic kidney disease.;Long Term: Maintenance of blood pressure at goal levels.   Lipids Yes   Intervention Provide education and support for participant on nutrition & aerobic/resistive exercise along with prescribed medications to achieve LDL 70mg , HDL >40mg .   Expected Outcomes Long Term: Cholesterol controlled with medications as prescribed, with individualized exercise RX and with personalized nutrition plan. Value goals: LDL < 70mg , HDL > 40 mg.;Short Term: Participant states  understanding of desired cholesterol values and is compliant with medications prescribed. Participant is following exercise prescription and nutrition guidelines.   Stress Yes   Intervention Offer individual and/or small group education and counseling on adjustment to heart disease, stress management and health-related lifestyle change. Teach and support self-help strategies.;Refer participants experiencing significant psychosocial distress to appropriate mental health specialists for further evaluation and treatment. When possible, include family members and significant others in education/counseling sessions.   Expected Outcomes Short Term: Participant demonstrates changes in health-related behavior, relaxation and other stress management skills, ability to obtain effective social support, and compliance with psychotropic medications if prescribed.;Long Term: Emotional wellbeing is indicated by absence of clinically significant psychosocial distress or social isolation.      Core Components/Risk Factors/Patient Goals Review:    Core Components/Risk Factors/Patient Goals at Discharge (Final Review):    ITP Comments:     ITP Comments    Row Name 06/01/17 0916           ITP Comments Medical Director, Dr. Fransico Him          Comments:  Patient attended orientation from 0815 to 1000 to review rules and guidelines for program. Completed 6 minute walk test, Intitial ITP, and exercise prescription.  VSS. Telemetry-SB/SR with no noted ectopy. Pt tolerated well with no complaints of discomfort. Brief Psychosocial Assessment - Reveals no immediate barriers to participating in cardiac rehab.  Pt had many questions regarding the exercise program and what to expect.  Pt noted on his medical history  to have some anxiety and stress related to the sudden onset of this cardiac event. Continue to offer support and encouragement to attend education classes on stress management and meditation.  Pt stated,  "he is looking forward to being better than I was prior to my heart attack". Cherre Huger, BSN Cardiac and Training and development officer

## 2017-06-02 ENCOUNTER — Telehealth (HOSPITAL_COMMUNITY): Payer: Self-pay | Admitting: *Deleted

## 2017-06-02 DIAGNOSIS — H43811 Vitreous degeneration, right eye: Secondary | ICD-10-CM | POA: Diagnosis not present

## 2017-06-02 DIAGNOSIS — H524 Presbyopia: Secondary | ICD-10-CM | POA: Diagnosis not present

## 2017-06-02 DIAGNOSIS — H2513 Age-related nuclear cataract, bilateral: Secondary | ICD-10-CM | POA: Diagnosis not present

## 2017-06-02 DIAGNOSIS — H353131 Nonexudative age-related macular degeneration, bilateral, early dry stage: Secondary | ICD-10-CM | POA: Diagnosis not present

## 2017-06-02 DIAGNOSIS — H40023 Open angle with borderline findings, high risk, bilateral: Secondary | ICD-10-CM | POA: Diagnosis not present

## 2017-06-02 NOTE — Telephone Encounter (Signed)
----- Message from Thompson Grayer, RN sent at 06/01/2017  4:23 PM EDT ----- Regarding: RE: ?Parameters for Cardiac Rehab I let him know.  Gregory Hale ----- Message ----- From: Gregory Pavy, RN Sent: 06/01/2017   2:03 PM To: Thompson Grayer, RN Subject: RE: ?Parameters for Cardiac Rehab              [name], He will not be back until next week so if you could reach out to him that would be great!! Thanks so much ----- Message ----- From: Thompson Grayer, RN Sent: 06/01/2017   1:33 PM To: Gregory Pavy, RN Subject: FW: ?Parameters for Cardiac Rehab              Do you want me to call pt with this information or will you let him know when he comes back to rehab tomorrow morning? Thanks, Electrical engineer ----- Message ----- From: [name], MD Sent: 06/01/2017   1:11 PM To: Charlie Pitter, PA-C, Gregory Pavy, RN, # Subject: RE: ?Parameters for Cardiac Rehab              Gregory Hale, Can we let him know that he can lift any weights and that there are no restrictions on exercise or BP parameters. His ascending aorta is only mildly dilated at the root. He should only use NTG sublingually if he has chest pressure. If he has recurrence of profound weakness/dizziness and sweating like he had with his MI, he should seek medical attention.   Thanks, chris  ----- Message ----- From: Gregory Pavy, RN Sent: 06/01/2017  12:27 PM To: [name], MD Subject: FW: ?Parameters for Cardiac Rehab              Dr. Glendale Chard this to you as well.  See below.  Pt had a question that I could not confidently answer regarding NTG.  I appreciate your help!! ----- Message ----- From: Gregory Pavy, RN Sent: 06/01/2017  11:21 AM To: Charlie Pitter, PA-C Subject: RE: ?Parameters for Cardiac Rehab              Thanks Dayna! Pt was in this morning and as you noted in your office note, he had a ton of questions. I was able to help him with most. One that he had  I was unable to confidently answer for him.  He wanted to know when he should take NTG. He presented with atypical symptoms prior to his stemi admission. Pt only complaint was sweating and dizziness.  Since there are different medical issues (non cardiac) that can elicit these symptoms, he is unsure of when to take it.   What would you advise him to do?  Thanks again Maurice Small RN, BSN Cardiac and Pulmonary Rehab Nurse Navigator   ----- Message ----- From: Felipa Evener Sent: 06/01/2017  10:34 AM To: Gregory Pavy, RN Subject: RE: ?Parameters for Cardiac Rehab              Artis Delay, I've put in a message to Dr. Angelena Form to help guide these answers. Will let you know as soon as I hear back.  ----- Message ----- From: Gregory Pavy, RN Sent: 05/30/2017   7:25 PM To: Charlie Pitter, PA-C Subject: ?Parameters for Cardiac Rehab                  Hi Danya, The above patient was seen by you on  8/7 in follow up from 04/22/17 STEMI and DES to R[facility].  Noted in your office note, "Mildly dilated aortic root and ascending aorta". Any restrictions for exercise? BP parameters for rest and on exertion?  Thanks for the input! Cherre Huger, BSN Cardiac and Training and development officer

## 2017-06-05 ENCOUNTER — Encounter (HOSPITAL_COMMUNITY)
Admission: RE | Admit: 2017-06-05 | Discharge: 2017-06-05 | Disposition: A | Discharge status: Home / Self Care | Payer: Medicare Other | Source: Ambulatory Visit | Attending: Cardiovascular Disease | Admitting: Cardiovascular Disease

## 2017-06-05 ENCOUNTER — Encounter (HOSPITAL_COMMUNITY): Payer: Self-pay

## 2017-06-05 DIAGNOSIS — Z955 Presence of coronary angioplasty implant and graft: Secondary | ICD-10-CM | POA: Diagnosis not present

## 2017-06-05 DIAGNOSIS — I213 ST elevation (STEMI) myocardial infarction of unspecified site: Secondary | ICD-10-CM | POA: Diagnosis present

## 2017-06-05 DIAGNOSIS — I2111 ST elevation (STEMI) myocardial infarction involving right coronary artery: Secondary | ICD-10-CM

## 2017-06-05 DIAGNOSIS — I2119 ST elevation (STEMI) myocardial infarction involving other coronary artery of inferior wall: Secondary | ICD-10-CM | POA: Diagnosis not present

## 2017-06-05 NOTE — Progress Notes (Signed)
Daily Session Note  Patient Details  Name: Gregory Hale MRN: [id number] Date of Birth: 05/05/1951 Referring Provider:     CARDIAC REHAB PHASE II ORIENTATION from 06/01/2017 in [facility]  Referring Provider  Lauree Chandler MD      Encounter Date: 06/05/2017  Check In:     Session Check In - 06/05/17 0834      Check-In   Location [facility]   Staff Present Su Hilt, MS, ACSM RCEP, Exercise Physiologist;Joann Rion, RN, BSN;Amber Fair, MS, ACSM RCEP, Exercise Physiologist   Supervising physician immediately available to respond to emergencies [facility] immediately available   Physician(s) Dr. Bonner Puna   Medication changes reported     No   Fall or balance concerns reported    No   Tobacco Cessation No Change   Warm-up and Cool-down Performed as group-led instruction   Resistance Training Performed Yes   VAD Patient? No     Pain Assessment   Currently in Pain? No/denies   Multiple Pain Sites No      Capillary Blood Glucose: No results found for this or any previous visit (from the past 24 hour(s)).    History  Smoking Status  . Former Smoker  Smokeless Tobacco  . Never Used    Goals Met:  Exercise tolerated well  Goals Unmet:  Not Applicable  Comments: Pt started cardiac rehab today.  Pt tolerated light exercise without difficulty. VSS, telemetry-sinus rhythm,  asymptomatic.  Medication list reconciled. Pt denies barriers to medicaiton compliance.  PSYCHOSOCIAL ASSESSMENT:  PHQ-0.  Pt exhibits positive coping skills, hopeful outlook with supportive family. No psychosocial needs identified at this time, no psychosocial interventions necessary.    Pt enjoys playing golf and doing strenuous yard work. Pt goals are get back to normal activities.   Pt oriented to exercise equipment and routine.    Understanding verbalized.   Dr. Fransico Him is Medical Director for Cardiac Rehab at [facility].

## 2017-06-07 ENCOUNTER — Encounter (HOSPITAL_COMMUNITY)
Admission: RE | Admit: 2017-06-07 | Discharge: 2017-06-07 | Disposition: A | Discharge status: Home / Self Care | Payer: Medicare Other | Source: Ambulatory Visit | Attending: Cardiovascular Disease | Admitting: Cardiovascular Disease

## 2017-06-07 DIAGNOSIS — I2111 ST elevation (STEMI) myocardial infarction involving right coronary artery: Secondary | ICD-10-CM

## 2017-06-07 DIAGNOSIS — Z955 Presence of coronary angioplasty implant and graft: Secondary | ICD-10-CM | POA: Diagnosis not present

## 2017-06-07 DIAGNOSIS — I2119 ST elevation (STEMI) myocardial infarction involving other coronary artery of inferior wall: Secondary | ICD-10-CM | POA: Diagnosis not present

## 2017-06-07 NOTE — Progress Notes (Signed)
Cardiac Individual Treatment Plan  Patient Details  Name: Gregory Hale MRN: [id number] Date of Birth: 08-13-1951 Referring Provider:     CARDIAC REHAB PHASE II ORIENTATION from 06/01/2017 in [place]  Referring Provider  Lauree Chandler MD      Initial Encounter Date:    CARDIAC REHAB PHASE II ORIENTATION from 06/01/2017 in [place]  Date  06/01/17  Referring Provider  Lauree Chandler MD      Visit Diagnosis: ST elevation myocardial infarction involving right coronary artery [facility])  Stented coronary artery  Patient's Home Medications on Admission:  Current Outpatient Prescriptions:  .  aspirin 81 MG chewable tablet, Chew 1 tablet (81 mg total) by mouth daily., Disp: 30 tablet, Rfl:  .  atorvastatin (LIPITOR) 80 MG tablet, Take 1 tablet (80 mg total) by mouth daily at 6 PM., Disp: 90 tablet, Rfl: 1 .  losartan (COZAAR) 25 MG tablet, Take 1 tablet (25 mg total) by mouth daily., Disp: 90 tablet, Rfl: 3 .  metoprolol tartrate (LOPRESSOR) 25 MG tablet, Take 0.5 tablets (12.5 mg total) by mouth 2 (two) times daily., Disp: 60 tablet, Rfl: 2 .  nitroGLYCERIN (NITROSTAT) 0.4 MG SL tablet, Place 1 tablet (0.4 mg total) under the tongue every 5 (five) minutes as needed., Disp: 25 tablet, Rfl: 3 .  ticagrelor (BRILINTA) 90 MG TABS tablet, Take 1 tablet (90 mg total) by mouth 2 (two) times daily., Disp: 60 tablet, Rfl: 10  Past Medical History: Past Medical History:  Diagnosis Date  . Arthritis   . Ascending aorta dilatation (HCC)   . Bradycardia    a. HR 40 in setting of acute inferior STEMI 03/2017.  [place] CAD in native artery    a. inf STEMI 03/2017 a/w hypotension and bradycardia -> s/p DES to RCA, otherwise mild disease in LAD, moderate disease in ostium of the moderate caliber diagonal branch, normal LVEF >65%  . Dilated aortic root ([place])   . STEMI involving right coronary artery ([place])    04/22/17 PCI/DES to mRCA,  normal EF  . Tobacco abuse    Quit in 1998    Tobacco Use: History  Smoking Status  . Former Smoker  Smokeless Tobacco  . Never Used    Labs: Recent [employer] for ITP Cardiac and Pulmonary Rehab Latest Ref Rng & Units 06/24/2011 05/27/2014 06/05/2015 04/23/2017 05/22/2017   Cholestrol 100 - 199 mg/dL 179 174 161 149 106   LDLCALC 0 - 99 mg/dL 112 104 93 90 45   HDL >39 mg/dL 54 56 59 47 53   Trlycerides 0 - 149 mg/dL 63 70 44 61 40   Hemoglobin A1c 4.0 - 6.0 % 5.4 5.1 5.4 - -      Capillary Blood Glucose: No results found for: GLUCAP   Exercise Target Goals:    Exercise Program Goal: Individual exercise prescription set with THRR, safety & activity barriers. Participant demonstrates ability to understand and report RPE using BORG scale, to self-measure pulse accurately, and to acknowledge the importance of the exercise prescription.  Exercise Prescription Goal: Starting with aerobic activity 30 plus minutes a day, 3 days per week for initial exercise prescription. Provide home exercise prescription and guidelines that participant acknowledges understanding prior to discharge.  Activity Barriers & Risk Stratification:     Activity Barriers & Cardiac Risk Stratification - 06/01/17 1119      Activity Barriers & Cardiac Risk Stratification   Activity Barriers  Back Problems;Arthritis   Cardiac Risk Stratification High      6 Minute Walk:     6 Minute Walk    Row Name 06/01/17 1118         6 Minute Walk   Phase Initial     Distance 1978 feet     Walk Time 6 minutes     # of Rest Breaks 0     MPH 3.7     METS 4.18     RPE 9     VO2 Peak 14.66     Symptoms No     Resting HR 59 bpm     Resting BP 110/62     Resting Oxygen Saturation  100 %     Exercise Oxygen Saturation  during 6 min walk 100 %     Max Ex. HR 78 bpm     Max Ex. BP 130/80     2 Minute Post BP 112/70        Oxygen Initial Assessment:   Oxygen Re-Evaluation:   Oxygen  Discharge (Final Oxygen Re-Evaluation):   Initial Exercise Prescription:     Initial Exercise Prescription - 06/01/17 1100      Date of Initial Exercise RX and Referring Provider   Date 06/01/17   Referring Provider Lauree Chandler MD     Treadmill   MPH 3.2   Grade 1   Minutes 10   METs 3.89     Bike   Level 1   Minutes 10   METs 3.1     NuStep   Level 4   SPM 90   Minutes 10   METs 3     Prescription Details   Frequency (times per week) 3   Duration Progress to 30 minutes of continuous aerobic without signs/symptoms of physical distress     Intensity   THRR 40-80% of Max Heartrate 62-124   Ratings of Perceived Exertion 11-13   Perceived Dyspnea 0-4     Progression   Progression Continue to progress workloads to maintain intensity without signs/symptoms of physical distress.     Resistance Training   Training Prescription Yes   Weight 5lbs   Reps 10-15      Perform Capillary Blood Glucose checks as needed.  Exercise Prescription Changes:     Exercise Prescription Changes    Row Name 06/05/17 1600             Response to Exercise   Blood Pressure (Admit) 112/70       Blood Pressure (Exercise) 146/80       Blood Pressure (Exit) 100/64       Heart Rate (Admit) 61 bpm       Heart Rate (Exercise) 95 bpm       Heart Rate (Exit) 60 bpm       Rating of Perceived Exertion (Exercise) 12       Symptoms none       Comments Pt was oriented to exercise equipment. Pt responded well to exercise session       Duration Continue with 30 min of aerobic exercise without signs/symptoms of physical distress.       Intensity THRR unchanged         Progression   Progression Continue to progress workloads to maintain intensity without signs/symptoms of physical distress.       Average METs 3.5         Resistance Training   Training Prescription Yes  Weight 5lbs       Reps 10-15       Time 10 Minutes         Treadmill   MPH 3.2       Grade 2        Minutes 10       METs 4.33         Bike   Level 1       Minutes 10       METs 3.18         NuStep   Level 4       SPM 90       Minutes 10       METs 2.9          Exercise Comments:     Exercise Comments    Row Name 06/05/17 1539           Exercise Comments Pt responded well to first exercise session. Pt was oriented to exercise equipment and did well with current Ex.Rx.          Exercise Goals and Review:     Exercise Goals    Row Name 06/01/17 1123 06/01/17 1124           Exercise Goals   Increase Physical Activity Yes  -      Intervention Provide advice, education, support and counseling about physical activity/exercise needs.;Develop an individualized exercise prescription for aerobic and resistive training based on initial evaluation findings, risk stratification, comorbidities and participant's personal goals.  -      Expected Outcomes Achievement of increased cardiorespiratory fitness and enhanced flexibility, muscular endurance and strength shown through measurements of functional capacity and personal statement of participant.  -      Increase Strength and Stamina Yes -  return to golf      Intervention Provide advice, education, support and counseling about physical activity/exercise needs.;Develop an individualized exercise prescription for aerobic and resistive training based on initial evaluation findings, risk stratification, comorbidities and participant's personal goals.  -      Expected Outcomes Achievement of increased cardiorespiratory fitness and enhanced flexibility, muscular endurance and strength shown through measurements of functional capacity and personal statement of participant.  -      Able to understand and use rate of perceived exertion (RPE) scale Yes  -      Intervention Provide education and explanation on how to use RPE scale  -      Expected Outcomes Short Term: Able to use RPE daily in rehab to express subjective intensity  level;Long Term:  Able to use RPE to guide intensity level when exercising independently  -      Knowledge and understanding of Target Heart Rate Range (THRR) Yes  -      Intervention Provide education and explanation of THRR including how the numbers were predicted and where they are located for reference  -      Expected Outcomes Short Term: Able to state/look up THRR;Short Term: Able to use daily as guideline for intensity in rehab;Long Term: Able to use THRR to govern intensity when exercising independently  -      Able to check pulse independently Yes  -      Intervention Provide education and demonstration on how to check pulse in carotid and radial arteries.;Review the importance of being able to check your own pulse for safety during independent exercise  -      Expected Outcomes Short Term: Able to  explain why pulse checking is important during independent exercise;Long Term: Able to check pulse independently and accurately  -      Understanding of Exercise Prescription Yes  -      Intervention Provide education, explanation, and written materials on patient's individual exercise prescription  -      Expected Outcomes Short Term: Able to explain program exercise prescription;Long Term: Able to explain home exercise prescription to exercise independently  -         Exercise Goals Re-Evaluation :     Exercise Goals Re-Evaluation    Row Name 06/06/17 1540             Exercise Goal Re-Evaluation   Exercise Goals Review Increase Physical Activity;Understanding of Exercise Prescription;Able to understand and use rate of perceived exertion (RPE) scale           Discharge Exercise Prescription (Final Exercise Prescription Changes):     Exercise Prescription Changes - 06/05/17 1600      Response to Exercise   Blood Pressure (Admit) 112/70   Blood Pressure (Exercise) 146/80   Blood Pressure (Exit) 100/64   Heart Rate (Admit) 61 bpm   Heart Rate (Exercise) 95 bpm   Heart Rate  (Exit) 60 bpm   Rating of Perceived Exertion (Exercise) 12   Symptoms none   Comments Pt was oriented to exercise equipment. Pt responded well to exercise session   Duration Continue with 30 min of aerobic exercise without signs/symptoms of physical distress.   Intensity THRR unchanged     Progression   Progression Continue to progress workloads to maintain intensity without signs/symptoms of physical distress.   Average METs 3.5     Resistance Training   Training Prescription Yes   Weight 5lbs   Reps 10-15   Time 10 Minutes     Treadmill   MPH 3.2   Grade 2   Minutes 10   METs 4.33     Bike   Level 1   Minutes 10   METs 3.18     NuStep   Level 4   SPM 90   Minutes 10   METs 2.9      Nutrition:  Target Goals: Understanding of nutrition guidelines, daily intake of sodium 1500mg , cholesterol 200mg , calories 30% from fat and 7% or less from saturated fats, daily to have 5 or more servings of fruits and vegetables.  Biometrics:     Pre Biometrics - 06/01/17 1123      Pre Biometrics   Waist Circumference 37 inches   Hip Circumference 41 inches   Waist to Hip Ratio 0.9 %   Triceps Skinfold 15 mm   % Body Fat 25.7 %   Grip Strength 48 kg   Flexibility 16 in   Single Leg Stand 20 seconds       Nutrition Therapy Plan and Nutrition Goals:     Nutrition Therapy & Goals - 06/01/17 1041      Nutrition Therapy   Diet Therapeutic Lifestyle Changes     Personal Nutrition Goals   Nutrition Goal Pt to identify and limit food sources of saturated fat, trans fat, and sodium   Personal Goal #2 Pt to identify food quantities necessary to achieve weight loss of 6-15 lb at graduation from cardiac rehab. Goal wt of 175 lb desired.      Intervention Plan   Intervention Prescribe, educate and counsel regarding individualized specific dietary modifications aiming towards targeted core components such as weight, hypertension, lipid management,  diabetes, heart failure and  other comorbidities.   Expected Outcomes Short Term Goal: Understand basic principles of dietary content, such as calories, fat, sodium, cholesterol and nutrients.;Long Term Goal: Adherence to prescribed nutrition plan.      Nutrition Discharge: Nutrition Scores:     Nutrition Assessments - 06/01/17 1041      MEDFICTS Scores   Pre Score 52      Nutrition Goals Re-Evaluation:   Nutrition Goals Re-Evaluation:   Nutrition Goals Discharge (Final Nutrition Goals Re-Evaluation):   Psychosocial: Target Goals: Acknowledge presence or absence of significant depression and/or stress, maximize coping skills, provide positive support system. Participant is able to verbalize types and ability to use techniques and skills needed for reducing stress and depression.  Initial Review & Psychosocial Screening:     Initial Psych Review & Screening - 06/01/17 1229      Initial Review   Current issues with Current Stress Concerns;Current Anxiety/Panic   Source of Stress Concerns Chronic Illness   Comments Recently retired 2 months ago, health related anxiety due to the sudden onset of his cardiac event     Family Dynamics   Good Support System? Yes     Barriers   Psychosocial barriers to participate in program The patient should benefit from training in stress management and relaxation.      Quality of Life Scores:     Quality of Life - 06/01/17 1125      Quality of Life Scores   Health/Function Pre 26 %   Socioeconomic Pre 29.17 %   Psych/Spiritual Pre 27.43 %   Family Pre 30 %   GLOBAL Post 27.48 %      PHQ-9: Recent Review Flowsheet Data    Depression screen [facility] 2/9 06/05/2017 06/23/2014   Decreased Interest 0 0   Down, Depressed, Hopeless 0 0   PHQ - 2 Score 0 0     Interpretation of Total Score  Total Score Depression Severity:  1-4 = Minimal depression, 5-9 = Mild depression, 10-14 = Moderate depression, 15-19 = Moderately severe depression, 20-27 = Severe  depression   Psychosocial Evaluation and Intervention:     Psychosocial Evaluation - 06/05/17 0942      Psychosocial Evaluation & Interventions   Interventions Encouraged to exercise with the program and follow exercise prescription;Stress management education   Comments pt exhibits health related stress/anxiety, otherwise no psychosocial needs idenitified, no interventions necessary    Expected Outcomes pt will exhibit positive outlook with good coping skills.    Continue Psychosocial Services  No Follow up required      Psychosocial Re-Evaluation:     Psychosocial Re-Evaluation    [place] Name 06/07/17 1459             Psychosocial Re-Evaluation   Current issues with Current Stress Concerns       Comments pt with health related anxiety. from recent cardiac event.        Expected Outcomes pt will demonstrate positive coping skills with good outlook        Interventions Encouraged to attend Cardiac Rehabilitation for the exercise;Stress management education;Relaxation education       Continue Psychosocial Services  No Follow up required          Psychosocial Discharge (Final Psychosocial Re-Evaluation):     Psychosocial Re-Evaluation - 06/07/17 1459      Psychosocial Re-Evaluation   Current issues with Current Stress Concerns   Comments pt with health related anxiety. from recent cardiac event.  Expected Outcomes pt will demonstrate positive coping skills with good outlook    Interventions Encouraged to attend Cardiac Rehabilitation for the exercise;Stress management education;Relaxation education   Continue Psychosocial Services  No Follow up required      Vocational Rehabilitation: Provide vocational rehab assistance to qualifying candidates.   Vocational Rehab Evaluation & Intervention:     Vocational Rehab - 06/01/17 1234      Initial Vocational Rehab Evaluation & Intervention   Assessment shows need for Vocational Rehabilitation No       Education: Education Goals: Education classes will be provided on a weekly basis, covering required topics. Participant will state understanding/return demonstration of topics presented.  Learning Barriers/Preferences:     Learning Barriers/Preferences - 06/01/17 1117      Learning Barriers/Preferences   Learning Barriers None   Learning Preferences Skilled Demonstration      Education Topics: Count Your Pulse:  -Group instruction provided by verbal instruction, demonstration, patient participation and written materials to support subject.  Instructors address importance of being able to find your pulse and how to count your pulse when at home without a heart monitor.  Patients get hands on experience counting their pulse with staff help and individually.   Heart Attack, Angina, and Risk Factor Modification:  -Group instruction provided by verbal instruction, video, and written materials to support subject.  Instructors address signs and symptoms of angina and heart attacks.    Also discuss risk factors for heart disease and how to make changes to improve heart health risk factors.   Functional Fitness:  -Group instruction provided by verbal instruction, demonstration, patient participation, and written materials to support subject.  Instructors address safety measures for doing things around the house.  Discuss how to get up and down off the floor, how to pick things up properly, how to safely get out of a chair without assistance, and balance training.   Meditation and Mindfulness:  -Group instruction provided by verbal instruction, patient participation, and written materials to support subject.  Instructor addresses importance of mindfulness and meditation practice to help reduce stress and improve awareness.  Instructor also leads participants through a meditation exercise.    Stretching for Flexibility and Mobility:  -Group instruction provided by verbal instruction,  patient participation, and written materials to support subject.  Instructors lead participants through series of stretches that are designed to increase flexibility thus improving mobility.  These stretches are additional exercise for major muscle groups that are typically performed during regular warm up and cool down.   Hands Only CPR:  -Group verbal, video, and participation provides a basic overview of AHA guidelines for community CPR. Role-play of emergencies allow participants the opportunity to practice calling for help and chest compression technique with discussion of AED use.   Hypertension: -Group verbal and written instruction that provides a basic overview of hypertension including the most recent diagnostic guidelines, risk factor reduction with self-care instructions and medication management.    Nutrition I class: Heart Healthy Eating:  -Group instruction provided by PowerPoint slides, verbal discussion, and written materials to support subject matter. The instructor gives an explanation and review of the Therapeutic Lifestyle Changes diet recommendations, which includes a discussion on lipid goals, dietary fat, sodium, fiber, plant stanol/sterol esters, sugar, and the components of a well-balanced, healthy diet.   Nutrition II class: Lifestyle Skills:  -Group instruction provided by PowerPoint slides, verbal discussion, and written materials to support subject matter. The instructor gives an explanation and review of label reading, grocery  shopping for heart health, heart healthy recipe modifications, and ways to make healthier choices when eating out.   Diabetes Question & Answer:  -Group instruction provided by PowerPoint slides, verbal discussion, and written materials to support subject matter. The instructor gives an explanation and review of diabetes co-morbidities, pre- and post-prandial blood glucose goals, pre-exercise blood glucose goals, signs, symptoms, and  treatment of hypoglycemia and hyperglycemia, and foot care basics.   Diabetes Blitz:  -Group instruction provided by PowerPoint slides, verbal discussion, and written materials to support subject matter. The instructor gives an explanation and review of the physiology behind type 1 and type 2 diabetes, diabetes medications and rational behind using different medications, pre- and post-prandial blood glucose recommendations and Hemoglobin A1c goals, diabetes diet, and exercise including blood glucose guidelines for exercising safely.    Portion Distortion:  -Group instruction provided by PowerPoint slides, verbal discussion, written materials, and food models to support subject matter. The instructor gives an explanation of serving size versus portion size, changes in portions sizes over the last 20 years, and what consists of a serving from each food group.   Stress Management:  -Group instruction provided by verbal instruction, video, and written materials to support subject matter.  Instructors review role of stress in heart disease and how to cope with stress positively.     Exercising on Your Own:  -Group instruction provided by verbal instruction, power point, and written materials to support subject.  Instructors discuss benefits of exercise, components of exercise, frequency and intensity of exercise, and end points for exercise.  Also discuss use of nitroglycerin and activating EMS.  Review options of places to exercise outside of rehab.  Review guidelines for sex with heart disease.   Cardiac Drugs I:  -Group instruction provided by verbal instruction and written materials to support subject.  Instructor reviews cardiac drug classes: antiplatelets, anticoagulants, beta blockers, and statins.  Instructor discusses reasons, side effects, and lifestyle considerations for each drug class.   CARDIAC REHAB PHASE II EXERCISE from 06/07/2017 in [place]  Date   06/07/17  Instruction Review Code  2- meets goals/outcomes      Cardiac Drugs II:  -Group instruction provided by verbal instruction and written materials to support subject.  Instructor reviews cardiac drug classes: angiotensin converting enzyme inhibitors (ACE-I), angiotensin II receptor blockers (ARBs), nitrates, and calcium channel blockers.  Instructor discusses reasons, side effects, and lifestyle considerations for each drug class.   Anatomy and Physiology of the Circulatory System:  Group verbal and written instruction and models provide basic cardiac anatomy and physiology, with the coronary electrical and arterial systems. Review of: AMI, Angina, Valve disease, Heart Failure, Peripheral Artery Disease, Cardiac Arrhythmia, Pacemakers, and the ICD.   Other Education:  -Group or individual verbal, written, or video instructions that support the educational goals of the cardiac rehab program.   Knowledge Questionnaire Score:     Knowledge Questionnaire Score - 06/01/17 1118      Knowledge Questionnaire Score   Pre Score 19/24      Core Components/Risk Factors/Patient Goals at Admission:     Personal Goals and Risk Factors at Admission - 06/01/17 1124      Core Components/Risk Factors/Patient Goals on Admission   Hypertension Yes   Intervention Provide education on lifestyle modifcations including regular physical activity/exercise, weight management, moderate sodium restriction and increased consumption of fresh fruit, vegetables, and low fat dairy, alcohol moderation, and smoking cessation.;Monitor prescription use compliance.   Expected Outcomes  Short Term: Continued assessment and intervention until BP is < 140/99mm HG in hypertensive participants. < 130/79mm HG in hypertensive participants with diabetes, heart failure or chronic kidney disease.;Long Term: Maintenance of blood pressure at goal levels.   Lipids Yes   Intervention Provide education and support for  participant on nutrition & aerobic/resistive exercise along with prescribed medications to achieve LDL 70mg , HDL >40mg .   Expected Outcomes Long Term: Cholesterol controlled with medications as prescribed, with individualized exercise RX and with personalized nutrition plan. Value goals: LDL < 70mg , HDL > 40 mg.;Short Term: Participant states understanding of desired cholesterol values and is compliant with medications prescribed. Participant is following exercise prescription and nutrition guidelines.   Stress Yes   Intervention Offer individual and/or small group education and counseling on adjustment to heart disease, stress management and health-related lifestyle change. Teach and support self-help strategies.;Refer participants experiencing significant psychosocial distress to appropriate mental health specialists for further evaluation and treatment. When possible, include family members and significant others in education/counseling sessions.   Expected Outcomes Short Term: Participant demonstrates changes in health-related behavior, relaxation and other stress management skills, ability to obtain effective social support, and compliance with psychotropic medications if prescribed.;Long Term: Emotional wellbeing is indicated by absence of clinically significant psychosocial distress or social isolation.      Core Components/Risk Factors/Patient Goals Review:      Goals and Risk Factor Review    Row Name 06/07/17 1457             Core Components/Risk Factors/Patient Goals Review   Personal Goals Review Hypertension;Lipids;Stress       Review pt with CAD RF demonstrates eagerness to participate in CR exercise, nutrition and education opportunities.        Expected Outcomes pt will participate in CR exercise, nutrition and lifestyle modification to decrease overall CAD RF.           Core Components/Risk Factors/Patient Goals at Discharge (Final Review):      Goals and Risk Factor  Review - 06/07/17 1457      Core Components/Risk Factors/Patient Goals Review   Personal Goals Review Hypertension;Lipids;Stress   Review pt with CAD RF demonstrates eagerness to participate in CR exercise, nutrition and education opportunities.    Expected Outcomes pt will participate in CR exercise, nutrition and lifestyle modification to decrease overall CAD RF.       ITP Comments:     ITP Comments    Row Name 06/01/17 0916 06/07/17 1457         ITP Comments Medical Director, Dr. Fransico Him Medical Director, Dr. Fransico Him         Comments: Pt is making expected progress toward personal goals after completing 2 sessions. Recommend continued exercise and life style modification education including  stress management and relaxation techniques to decrease cardiac risk profile.

## 2017-06-09 ENCOUNTER — Encounter (HOSPITAL_COMMUNITY)
Admission: RE | Admit: 2017-06-09 | Discharge: 2017-06-09 | Disposition: A | Discharge status: Home / Self Care | Payer: Medicare Other | Source: Ambulatory Visit | Attending: Cardiovascular Disease | Admitting: Cardiovascular Disease

## 2017-06-09 DIAGNOSIS — I2111 ST elevation (STEMI) myocardial infarction involving right coronary artery: Secondary | ICD-10-CM

## 2017-06-09 DIAGNOSIS — I2119 ST elevation (STEMI) myocardial infarction involving other coronary artery of inferior wall: Secondary | ICD-10-CM | POA: Diagnosis not present

## 2017-06-09 DIAGNOSIS — Z955 Presence of coronary angioplasty implant and graft: Secondary | ICD-10-CM

## 2017-06-12 ENCOUNTER — Encounter (HOSPITAL_COMMUNITY)
Admission: RE | Admit: 2017-06-12 | Discharge: 2017-06-12 | Disposition: A | Discharge status: Home / Self Care | Payer: Medicare Other | Source: Ambulatory Visit | Attending: Cardiovascular Disease | Admitting: Cardiovascular Disease

## 2017-06-12 DIAGNOSIS — I2111 ST elevation (STEMI) myocardial infarction involving right coronary artery: Secondary | ICD-10-CM

## 2017-06-12 DIAGNOSIS — I2119 ST elevation (STEMI) myocardial infarction involving other coronary artery of inferior wall: Secondary | ICD-10-CM | POA: Diagnosis not present

## 2017-06-12 DIAGNOSIS — Z955 Presence of coronary angioplasty implant and graft: Secondary | ICD-10-CM

## 2017-06-12 NOTE — Progress Notes (Signed)
Gregory Hale 66 y.o. male DOB: December 24, 1950 MRN: [id number]      Nutrition Note  Dx: MI s/p DES RCA  Nutrition Note Spoke with pt. Nutrition plan and survey reviewed with pt. Pt is following Step 1 of the Therapeutic Lifestyle Changes diet. Pt expressed understanding of the information reviewed. Pt aware of nutrition education classes offered and plans on attending nutrition classes.  Nutrition Diagnosis ? Food-and nutrition-related knowledge deficit related to lack of exposure to information as related to diagnosis of: ? CVD ? Overweight related to excessive energy intake as evidenced by a BMI of 27.4  Nutrition Intervention ? Pt's individual nutrition plan and goals reviewed with pt. ? Benefits of adopting Therapeutic Lifestyle Changes discussed when [employer] reviewed.   ? Pt given handouts for: ? Nutrition I class  Nutrition Goal(s):  ? Pt to identify and limit food sources of saturated fat, trans fat, and sodium ? Pt to identify food quantities necessary to achieve weight loss of 6-15 lb at graduation from cardiac rehab. Goal wt of 175 lb desired.   Plan:  Pt to attend nutrition classes ? Nutrition I ? Nutrition II ? Portion Distortion  Will provide client-centered nutrition education as part of interdisciplinary care.   Monitor and evaluate progress toward nutrition goal with team.  Derek Mound, M.Ed, RD, LDN, CDE 06/12/2017 9:16 AM

## 2017-06-12 NOTE — Progress Notes (Signed)
Reviewed home exercise with pt today.  Pt plans to walk 2.24miles on Tues/Thurs/Sat. for exercise.  Reviewed THR, pulse, RPE, sign and symptoms, NTG use, and when to call 911 or MD.  Also discussed weather considerations and indoor options.  Pt voiced understanding.    Okla Qazi [employer]

## 2017-06-13 ENCOUNTER — Other Ambulatory Visit: Payer: Medicare Other

## 2017-06-13 ENCOUNTER — Other Ambulatory Visit: Payer: Self-pay | Admitting: *Deleted

## 2017-06-13 DIAGNOSIS — Z79899 Other long term (current) drug therapy: Secondary | ICD-10-CM | POA: Diagnosis not present

## 2017-06-13 DIAGNOSIS — I10 Essential (primary) hypertension: Secondary | ICD-10-CM

## 2017-06-13 DIAGNOSIS — R319 Hematuria, unspecified: Secondary | ICD-10-CM

## 2017-06-13 LAB — BASIC METABOLIC PANEL
BUN / CREAT RATIO: 14 (ref 10–24)
BUN: 15 mg/dL (ref 8–27)
CHLORIDE: 103 mmol/L (ref 96–106)
CO2: 24 mmol/L (ref 20–29)
Calcium: 9.4 mg/dL (ref 8.6–10.2)
Creatinine, Ser: 1.06 mg/dL (ref 0.76–1.27)
GFR, EST AFRICAN AMERICAN: 85 mL/min/{1.73_m2} (ref >59)
GFR, EST NON AFRICAN AMERICAN: 73 mL/min/{1.73_m2} (ref >59)
Glucose: 108 mg/dL — ABNORMAL HIGH (ref 65–99)
POTASSIUM: 4.7 mmol/L (ref 3.5–5.2)
SODIUM: 140 mmol/L (ref 134–144)

## 2017-06-14 ENCOUNTER — Encounter (HOSPITAL_COMMUNITY)
Admission: RE | Admit: 2017-06-14 | Discharge: 2017-06-14 | Disposition: A | Discharge status: Home / Self Care | Payer: Medicare Other | Source: Ambulatory Visit | Attending: Cardiovascular Disease | Admitting: Cardiovascular Disease

## 2017-06-14 DIAGNOSIS — Z955 Presence of coronary angioplasty implant and graft: Secondary | ICD-10-CM | POA: Diagnosis not present

## 2017-06-14 DIAGNOSIS — I2111 ST elevation (STEMI) myocardial infarction involving right coronary artery: Secondary | ICD-10-CM

## 2017-06-14 DIAGNOSIS — I2119 ST elevation (STEMI) myocardial infarction involving other coronary artery of inferior wall: Secondary | ICD-10-CM | POA: Diagnosis not present

## 2017-06-15 ENCOUNTER — Encounter: Payer: Self-pay | Admitting: Internal Medicine

## 2017-06-16 ENCOUNTER — Encounter (HOSPITAL_COMMUNITY)
Admission: RE | Admit: 2017-06-16 | Discharge: 2017-06-16 | Disposition: A | Discharge status: Home / Self Care | Payer: Medicare Other | Source: Ambulatory Visit | Attending: Cardiovascular Disease | Admitting: Cardiovascular Disease

## 2017-06-16 DIAGNOSIS — I2119 ST elevation (STEMI) myocardial infarction involving other coronary artery of inferior wall: Secondary | ICD-10-CM | POA: Diagnosis not present

## 2017-06-16 DIAGNOSIS — Z955 Presence of coronary angioplasty implant and graft: Secondary | ICD-10-CM

## 2017-06-16 DIAGNOSIS — I2111 ST elevation (STEMI) myocardial infarction involving right coronary artery: Secondary | ICD-10-CM

## 2017-06-19 ENCOUNTER — Encounter (HOSPITAL_COMMUNITY)
Admission: RE | Admit: 2017-06-19 | Discharge: 2017-06-19 | Disposition: A | Discharge status: Home / Self Care | Payer: Medicare Other | Source: Ambulatory Visit | Attending: Cardiovascular Disease | Admitting: Cardiovascular Disease

## 2017-06-19 DIAGNOSIS — I2111 ST elevation (STEMI) myocardial infarction involving right coronary artery: Secondary | ICD-10-CM

## 2017-06-19 DIAGNOSIS — I2119 ST elevation (STEMI) myocardial infarction involving other coronary artery of inferior wall: Secondary | ICD-10-CM | POA: Diagnosis not present

## 2017-06-19 DIAGNOSIS — Z955 Presence of coronary angioplasty implant and graft: Secondary | ICD-10-CM | POA: Diagnosis not present

## 2017-06-21 ENCOUNTER — Encounter (HOSPITAL_COMMUNITY)
Admission: RE | Admit: 2017-06-21 | Discharge: 2017-06-21 | Disposition: A | Discharge status: Home / Self Care | Payer: Medicare Other | Source: Ambulatory Visit | Attending: Cardiovascular Disease | Admitting: Cardiovascular Disease

## 2017-06-21 DIAGNOSIS — Z955 Presence of coronary angioplasty implant and graft: Secondary | ICD-10-CM | POA: Diagnosis not present

## 2017-06-21 DIAGNOSIS — I2119 ST elevation (STEMI) myocardial infarction involving other coronary artery of inferior wall: Secondary | ICD-10-CM | POA: Diagnosis not present

## 2017-06-21 DIAGNOSIS — I2111 ST elevation (STEMI) myocardial infarction involving right coronary artery: Secondary | ICD-10-CM

## 2017-06-23 ENCOUNTER — Encounter (HOSPITAL_COMMUNITY)
Admission: RE | Admit: 2017-06-23 | Discharge: 2017-06-23 | Disposition: A | Discharge status: Home / Self Care | Payer: Medicare Other | Source: Ambulatory Visit | Attending: Cardiovascular Disease | Admitting: Cardiovascular Disease

## 2017-06-23 DIAGNOSIS — I2119 ST elevation (STEMI) myocardial infarction involving other coronary artery of inferior wall: Secondary | ICD-10-CM | POA: Diagnosis not present

## 2017-06-23 DIAGNOSIS — I2111 ST elevation (STEMI) myocardial infarction involving right coronary artery: Secondary | ICD-10-CM

## 2017-06-23 DIAGNOSIS — Z955 Presence of coronary angioplasty implant and graft: Secondary | ICD-10-CM

## 2017-06-26 ENCOUNTER — Encounter (HOSPITAL_COMMUNITY)
Admission: RE | Admit: 2017-06-26 | Discharge: 2017-06-26 | Disposition: A | Discharge status: Home / Self Care | Payer: Medicare Other | Source: Ambulatory Visit | Attending: Cardiovascular Disease | Admitting: Cardiovascular Disease

## 2017-06-26 DIAGNOSIS — I2111 ST elevation (STEMI) myocardial infarction involving right coronary artery: Secondary | ICD-10-CM

## 2017-06-26 DIAGNOSIS — I2119 ST elevation (STEMI) myocardial infarction involving other coronary artery of inferior wall: Secondary | ICD-10-CM | POA: Insufficient documentation

## 2017-06-26 DIAGNOSIS — I213 ST elevation (STEMI) myocardial infarction of unspecified site: Secondary | ICD-10-CM | POA: Diagnosis present

## 2017-06-26 DIAGNOSIS — Z955 Presence of coronary angioplasty implant and graft: Secondary | ICD-10-CM | POA: Diagnosis not present

## 2017-06-28 ENCOUNTER — Encounter (HOSPITAL_COMMUNITY): Payer: Medicare Other

## 2017-06-28 DIAGNOSIS — Z23 Encounter for immunization: Secondary | ICD-10-CM | POA: Diagnosis not present

## 2017-06-30 ENCOUNTER — Encounter (HOSPITAL_COMMUNITY): Payer: Medicare Other

## 2017-07-03 ENCOUNTER — Encounter (HOSPITAL_COMMUNITY): Payer: Medicare Other

## 2017-07-04 NOTE — Addendum Note (Signed)
Encounter addended by: Dorna Bloom D on: 07/04/2017  2:46 PM<BR>    Actions taken: Visit Navigator Flowsheet section accepted

## 2017-07-04 NOTE — Progress Notes (Signed)
Cardiac Individual Treatment Plan  Patient Details  Name: Gregory Hale MRN: [id number] Date of Birth: 07-08-1951 Referring Provider:     CARDIAC REHAB PHASE II ORIENTATION from 06/01/2017 in [place]  Referring Provider  Lauree Chandler MD      Initial Encounter Date:    CARDIAC REHAB PHASE II ORIENTATION from 06/01/2017 in [place]  Date  06/01/17  Referring Provider  Lauree Chandler MD      Visit Diagnosis: ST elevation myocardial infarction involving right coronary artery [facility])  Stented coronary artery  Patient's Home Medications on Admission:  Current Outpatient Prescriptions:  .  aspirin 81 MG chewable tablet, Chew 1 tablet (81 mg total) by mouth daily., Disp: 30 tablet, Rfl:  .  atorvastatin (LIPITOR) 80 MG tablet, Take 1 tablet (80 mg total) by mouth daily at 6 PM., Disp: 90 tablet, Rfl: 1 .  losartan (COZAAR) 25 MG tablet, Take 1 tablet (25 mg total) by mouth daily., Disp: 90 tablet, Rfl: 3 .  metoprolol tartrate (LOPRESSOR) 25 MG tablet, Take 0.5 tablets (12.5 mg total) by mouth 2 (two) times daily., Disp: 60 tablet, Rfl: 2 .  nitroGLYCERIN (NITROSTAT) 0.4 MG SL tablet, Place 1 tablet (0.4 mg total) under the tongue every 5 (five) minutes as needed., Disp: 25 tablet, Rfl: 3 .  ticagrelor (BRILINTA) 90 MG TABS tablet, Take 1 tablet (90 mg total) by mouth 2 (two) times daily., Disp: 60 tablet, Rfl: 10  Past Medical History: Past Medical History:  Diagnosis Date  . Arthritis   . Ascending aorta dilatation (HCC)   . Bradycardia    a. HR 40 in setting of acute inferior STEMI 03/2017.  [place] CAD in native artery    a. inf STEMI 03/2017 a/w hypotension and bradycardia -> s/p DES to RCA, otherwise mild disease in LAD, moderate disease in ostium of the moderate caliber diagonal branch, normal LVEF >65%  . Dilated aortic root ([place])   . STEMI involving right coronary artery ([place])    04/22/17 PCI/DES to mRCA,  normal EF  . Tobacco abuse    Quit in 1998    Tobacco Use: History  Smoking Status  . Former Smoker  Smokeless Tobacco  . Never Used    Labs: Recent [employer] for ITP Cardiac and Pulmonary Rehab Latest Ref Rng & Units 06/24/2011 05/27/2014 06/05/2015 04/23/2017 05/22/2017   Cholestrol 100 - 199 mg/dL 179 174 161 149 106   LDLCALC 0 - 99 mg/dL 112 104 93 90 45   HDL >39 mg/dL 54 56 59 47 53   Trlycerides 0 - 149 mg/dL 63 70 44 61 40   Hemoglobin A1c 4.0 - 6.0 % 5.4 5.1 5.4 - -      Capillary Blood Glucose: No results found for: GLUCAP   Exercise Target Goals:    Exercise Program Goal: Individual exercise prescription set with THRR, safety & activity barriers. Participant demonstrates ability to understand and report RPE using BORG scale, to self-measure pulse accurately, and to acknowledge the importance of the exercise prescription.  Exercise Prescription Goal: Starting with aerobic activity 30 plus minutes a day, 3 days per week for initial exercise prescription. Provide home exercise prescription and guidelines that participant acknowledges understanding prior to discharge.  Activity Barriers & Risk Stratification:     Activity Barriers & Cardiac Risk Stratification - 06/01/17 1119      Activity Barriers & Cardiac Risk Stratification   Activity Barriers  Back Problems;Arthritis   Cardiac Risk Stratification High      6 Minute Walk:     6 Minute Walk    Row Name 06/01/17 1118         6 Minute Walk   Phase Initial     Distance 1978 feet     Walk Time 6 minutes     # of Rest Breaks 0     MPH 3.7     METS 4.18     RPE 9     VO2 Peak 14.66     Symptoms No     Resting HR 59 bpm     Resting BP 110/62     Resting Oxygen Saturation  100 %     Exercise Oxygen Saturation  during 6 min walk 100 %     Max Ex. HR 78 bpm     Max Ex. BP 130/80     2 Minute Post BP 112/70        Oxygen Initial Assessment:   Oxygen Re-Evaluation:   Oxygen  Discharge (Final Oxygen Re-Evaluation):   Initial Exercise Prescription:     Initial Exercise Prescription - 06/01/17 1100      Date of Initial Exercise RX and Referring Provider   Date 06/01/17   Referring Provider Lauree Chandler MD     Treadmill   MPH 3.2   Grade 1   Minutes 10   METs 3.89     Bike   Level 1   Minutes 10   METs 3.1     NuStep   Level 4   SPM 90   Minutes 10   METs 3     Prescription Details   Frequency (times per week) 3   Duration Progress to 30 minutes of continuous aerobic without signs/symptoms of physical distress     Intensity   THRR 40-80% of Max Heartrate 62-124   Ratings of Perceived Exertion 11-13   Perceived Dyspnea 0-4     Progression   Progression Continue to progress workloads to maintain intensity without signs/symptoms of physical distress.     Resistance Training   Training Prescription Yes   Weight 5lbs   Reps 10-15      Perform Capillary Blood Glucose checks as needed.  Exercise Prescription Changes:     Exercise Prescription Changes    Row Name 06/05/17 1600 06/12/17 1357 06/27/17 1300         Response to Exercise   Blood Pressure (Admit) 112/70 132/80 100/60     Blood Pressure (Exercise) 146/80 150/68 138/80     Blood Pressure (Exit) 100/64 120/80 118/70     Heart Rate (Admit) 61 bpm 70 bpm 65 bpm     Heart Rate (Exercise) 95 bpm 101 bpm 109 bpm     Heart Rate (Exit) 60 bpm 70 bpm 68 bpm     Rating of Perceived Exertion (Exercise) 12 11 11      Symptoms none none none     Comments Pt was oriented to exercise equipment. Pt responded well to exercise session  -  -     Duration Continue with 30 min of aerobic exercise without signs/symptoms of physical distress. Continue with 30 min of aerobic exercise without signs/symptoms of physical distress. Continue with 30 min of aerobic exercise without signs/symptoms of physical distress.     Intensity THRR unchanged THRR unchanged THRR unchanged        Progression   Progression Continue to progress workloads to maintain intensity  without signs/symptoms of physical distress. Continue to progress workloads to maintain intensity without signs/symptoms of physical distress. Continue to progress workloads to maintain intensity without signs/symptoms of physical distress.     Average METs 3.5 4.3 5.2       Resistance Training   Training Prescription Yes Yes Yes     Weight 5lbs 8lbs 9lbs     Reps 10-15 10-15 10-15     Time 10 Minutes 10 Minutes 10 Minutes       Treadmill   MPH 3.2 3.2 3.6     Grade 2 2 3      Minutes 10 10 10      METs 4.33 4.33 5.25       Bike   Level 1 1.5 2.1     Minutes 10 10 10      METs 3.18 4.19 5.57       NuStep   Level 4 5 5      SPM 90 90 100     Minutes 10 10 10      METs 2.9 4.5 5       Home Exercise Plan   Plans to continue exercise at  - Home (comment)  walking Home (comment)  walking     Frequency  - Add 3 additional days to program exercise sessions. Add 3 additional days to program exercise sessions.     Initial Home Exercises Provided  - 06/12/17 06/12/17        Exercise Comments:     Exercise Comments    Row Name 06/05/17 1539 07/04/17 1442         Exercise Comments Pt responded well to first exercise session. Pt was oriented to exercise equipment and did well with current Ex.Rx. Reviewed METs and goals. Pt is tolerating exercise well; will continue to monitor pt's progress and activity levels.          Exercise Goals and Review:     Exercise Goals    Row Name 06/01/17 1123 06/01/17 1124           Exercise Goals   Increase Physical Activity Yes  -      Intervention Provide advice, education, support and counseling about physical activity/exercise needs.;Develop an individualized exercise prescription for aerobic and resistive training based on initial evaluation findings, risk stratification, comorbidities and participant's personal goals.  -      Expected Outcomes Achievement of  increased cardiorespiratory fitness and enhanced flexibility, muscular endurance and strength shown through measurements of functional capacity and personal statement of participant.  -      Increase Strength and Stamina Yes -  return to golf      Intervention Provide advice, education, support and counseling about physical activity/exercise needs.;Develop an individualized exercise prescription for aerobic and resistive training based on initial evaluation findings, risk stratification, comorbidities and participant's personal goals.  -      Expected Outcomes Achievement of increased cardiorespiratory fitness and enhanced flexibility, muscular endurance and strength shown through measurements of functional capacity and personal statement of participant.  -      Able to understand and use rate of perceived exertion (RPE) scale Yes  -      Intervention Provide education and explanation on how to use RPE scale  -      Expected Outcomes Short Term: Able to use RPE daily in rehab to express subjective intensity level;Long Term:  Able to use RPE to guide intensity level when exercising independently  -      Knowledge  and understanding of Target Heart Rate Range (THRR) Yes  -      Intervention Provide education and explanation of THRR including how the numbers were predicted and where they are located for reference  -      Expected Outcomes Short Term: Able to state/look up THRR;Short Term: Able to use daily as guideline for intensity in rehab;Long Term: Able to use THRR to govern intensity when exercising independently  -      Able to check pulse independently Yes  -      Intervention Provide education and demonstration on how to check pulse in carotid and radial arteries.;Review the importance of being able to check your own pulse for safety during independent exercise  -      Expected Outcomes Short Term: Able to explain why pulse checking is important during independent exercise;Long Term: Able to check  pulse independently and accurately  -      Understanding of Exercise Prescription Yes  -      Intervention Provide education, explanation, and written materials on patient's individual exercise prescription  -      Expected Outcomes Short Term: Able to explain program exercise prescription;Long Term: Able to explain home exercise prescription to exercise independently  -         Exercise Goals Re-Evaluation :     Exercise Goals Re-Evaluation    Row Name 06/06/17 1540 06/12/17 0945 07/04/17 1443         Exercise Goal Re-Evaluation   Exercise Goals Review Increase Physical Activity;Understanding of Exercise Prescription;Able to understand and use rate of perceived exertion (RPE) scale Increase Physical Activity;Able to understand and use rate of perceived exertion (RPE) scale;Knowledge and understanding of Target Heart Rate Range (THRR);Understanding of Exercise Prescription;Increase Strength and Stamina Increase Physical Activity;Able to understand and use rate of perceived exertion (RPE) scale;Knowledge and understanding of Target Heart Rate Range (THRR);Understanding of Exercise Prescription;Increase Strength and Stamina;Able to check pulse independently     Comments  - Reviewed home exercise with pt today.  Pt plans to walk 2.53miles on Tues/Thurs/Sat. for exercise.  Reviewed THR, pulse, RPE, sign and symptoms, NTG use, and when to call 911 or MD.  Also discussed weather considerations and indoor options.  Pt voiced understanding. Pt tolerates WL increases very well; pt is averaging 5.2 METs and is exercising on the nustep machine at a level 5.      Expected Outcomes  - Pt will be compliant with HEP and improve in cardiorespiratory fitness/functional capacity. Pt will continue to improve in cardiorespiratory fitness and functional mobility.          Discharge Exercise Prescription (Final Exercise Prescription Changes):     Exercise Prescription Changes - 06/27/17 1300      Response to  Exercise   Blood Pressure (Admit) 100/60   Blood Pressure (Exercise) 138/80   Blood Pressure (Exit) 118/70   Heart Rate (Admit) 65 bpm   Heart Rate (Exercise) 109 bpm   Heart Rate (Exit) 68 bpm   Rating of Perceived Exertion (Exercise) 11   Symptoms none   Duration Continue with 30 min of aerobic exercise without signs/symptoms of physical distress.   Intensity THRR unchanged     Progression   Progression Continue to progress workloads to maintain intensity without signs/symptoms of physical distress.   Average METs 5.2     Resistance Training   Training Prescription Yes   Weight 9lbs   Reps 10-15   Time 10 Minutes  Treadmill   MPH 3.6   Grade 3   Minutes 10   METs 5.25     Bike   Level 2.1   Minutes 10   METs 5.57     NuStep   Level 5   SPM 100   Minutes 10   METs 5     Home Exercise Plan   Plans to continue exercise at Home (comment)  walking   Frequency Add 3 additional days to program exercise sessions.   Initial Home Exercises Provided 06/12/17      Nutrition:  Target Goals: Understanding of nutrition guidelines, daily intake of sodium 1500mg , cholesterol 200mg , calories 30% from fat and 7% or less from saturated fats, daily to have 5 or more servings of fruits and vegetables.  Biometrics:     Pre Biometrics - 06/01/17 1123      Pre Biometrics   Waist Circumference 37 inches   Hip Circumference 41 inches   Waist to Hip Ratio 0.9 %   Triceps Skinfold 15 mm   % Body Fat 25.7 %   Grip Strength 48 kg   Flexibility 16 in   Single Leg Stand 20 seconds       Nutrition Therapy Plan and Nutrition Goals:     Nutrition Therapy & Goals - 06/01/17 1041      Nutrition Therapy   Diet Therapeutic Lifestyle Changes     Personal Nutrition Goals   Nutrition Goal Pt to identify and limit food sources of saturated fat, trans fat, and sodium   Personal Goal #2 Pt to identify food quantities necessary to achieve weight loss of 6-15 lb at graduation  from cardiac rehab. Goal wt of 175 lb desired.      Intervention Plan   Intervention Prescribe, educate and counsel regarding individualized specific dietary modifications aiming towards targeted core components such as weight, hypertension, lipid management, diabetes, heart failure and other comorbidities.   Expected Outcomes Short Term Goal: Understand basic principles of dietary content, such as calories, fat, sodium, cholesterol and nutrients.;Long Term Goal: Adherence to prescribed nutrition plan.      Nutrition Discharge: Nutrition Scores:     Nutrition Assessments - 06/01/17 1041      MEDFICTS Scores   Pre Score 52      Nutrition Goals Re-Evaluation:   Nutrition Goals Re-Evaluation:   Nutrition Goals Discharge (Final Nutrition Goals Re-Evaluation):   Psychosocial: Target Goals: Acknowledge presence or absence of significant depression and/or stress, maximize coping skills, provide positive support system. Participant is able to verbalize types and ability to use techniques and skills needed for reducing stress and depression.  Initial Review & Psychosocial Screening:     Initial Psych Review & Screening - 06/01/17 1229      Initial Review   Current issues with Current Stress Concerns;Current Anxiety/Panic   Source of Stress Concerns Chronic Illness   Comments Recently retired 2 months ago, health related anxiety due to the sudden onset of his cardiac event     Family Dynamics   Good Support System? Yes     Barriers   Psychosocial barriers to participate in program The patient should benefit from training in stress management and relaxation.      Quality of Life Scores:     Quality of Life - 06/01/17 1125      Quality of Life Scores   Health/Function Pre 26 %   Socioeconomic Pre 29.17 %   Psych/Spiritual Pre 27.43 %   Family Pre 30 %  GLOBAL Post 27.48 %      PHQ-9: Recent Review Flowsheet Data    Depression screen [facility] 2/9 06/05/2017 06/23/2014    Decreased Interest 0 0   Down, Depressed, Hopeless 0 0   PHQ - 2 Score 0 0     Interpretation of Total Score  Total Score Depression Severity:  1-4 = Minimal depression, 5-9 = Mild depression, 10-14 = Moderate depression, 15-19 = Moderately severe depression, 20-27 = Severe depression   Psychosocial Evaluation and Intervention:     Psychosocial Evaluation - 06/05/17 0942      Psychosocial Evaluation & Interventions   Interventions Encouraged to exercise with the program and follow exercise prescription;Stress management education   Comments pt exhibits health related stress/anxiety, otherwise no psychosocial needs idenitified, no interventions necessary    Expected Outcomes pt will exhibit positive outlook with good coping skills.    Continue Psychosocial Services  No Follow up required      Psychosocial Re-Evaluation:     Psychosocial Re-Evaluation    [place] Name 06/07/17 1459 07/04/17 1652           Psychosocial Re-Evaluation   Current issues with Current Stress Concerns Current Stress Concerns      Comments pt with health related anxiety. from recent cardiac event.  pt with health related anxiety. from recent cardiac event.       Expected Outcomes pt will demonstrate positive coping skills with good outlook  pt will demonstrate positive coping skills with good outlook       Interventions Encouraged to attend Cardiac Rehabilitation for the exercise;Stress management education;Relaxation education Encouraged to attend Cardiac Rehabilitation for the exercise;Stress management education;Relaxation education      Continue Psychosocial Services  No Follow up required No Follow up required      Comments  - Recently retired 2 months ago, health related anxiety due to the sudden onset of his cardiac event        Initial Review   Source of Stress Concerns  - Chronic Illness         Psychosocial Discharge (Final Psychosocial Re-Evaluation):     Psychosocial Re-Evaluation -  07/04/17 1652      Psychosocial Re-Evaluation   Current issues with Current Stress Concerns   Comments pt with health related anxiety. from recent cardiac event.    Expected Outcomes pt will demonstrate positive coping skills with good outlook    Interventions Encouraged to attend Cardiac Rehabilitation for the exercise;Stress management education;Relaxation education   Continue Psychosocial Services  No Follow up required   Comments Recently retired 2 months ago, health related anxiety due to the sudden onset of his cardiac event     Initial Review   Source of Stress Concerns Chronic Illness      Vocational Rehabilitation: Provide vocational rehab assistance to qualifying candidates.   Vocational Rehab Evaluation & Intervention:     Vocational Rehab - 06/01/17 1234      Initial Vocational Rehab Evaluation & Intervention   Assessment shows need for Vocational Rehabilitation No      Education: Education Goals: Education classes will be provided on a weekly basis, covering required topics. Participant will state understanding/return demonstration of topics presented.  Learning Barriers/Preferences:     Learning Barriers/Preferences - 06/01/17 1117      Learning Barriers/Preferences   Learning Barriers None   Learning Preferences Skilled Demonstration      Education Topics: Count Your Pulse:  -Group instruction provided by verbal instruction, demonstration, patient participation and written  materials to support subject.  Instructors address importance of being able to find your pulse and how to count your pulse when at home without a heart monitor.  Patients get hands on experience counting their pulse with staff help and individually.   Heart Attack, Angina, and Risk Factor Modification:  -Group instruction provided by verbal instruction, video, and written materials to support subject.  Instructors address signs and symptoms of angina and heart attacks.    Also discuss  risk factors for heart disease and how to make changes to improve heart health risk factors.   Functional Fitness:  -Group instruction provided by verbal instruction, demonstration, patient participation, and written materials to support subject.  Instructors address safety measures for doing things around the house.  Discuss how to get up and down off the floor, how to pick things up properly, how to safely get out of a chair without assistance, and balance training.   CARDIAC REHAB PHASE II EXERCISE from 06/23/2017 in [place]  Date  06/16/17  Instruction Review Code  2- meets goals/outcomes      Meditation and Mindfulness:  -Group instruction provided by verbal instruction, patient participation, and written materials to support subject.  Instructor addresses importance of mindfulness and meditation practice to help reduce stress and improve awareness.  Instructor also leads participants through a meditation exercise.    Stretching for Flexibility and Mobility:  -Group instruction provided by verbal instruction, patient participation, and written materials to support subject.  Instructors lead participants through series of stretches that are designed to increase flexibility thus improving mobility.  These stretches are additional exercise for major muscle groups that are typically performed during regular warm up and cool down.   Hands Only CPR:  -Group verbal, video, and participation provides a basic overview of AHA guidelines for community CPR. Role-play of emergencies allow participants the opportunity to practice calling for help and chest compression technique with discussion of AED use.   Hypertension: -Group verbal and written instruction that provides a basic overview of hypertension including the most recent diagnostic guidelines, risk factor reduction with self-care instructions and medication management.   CARDIAC REHAB PHASE II EXERCISE from  06/23/2017 in [place]  Date  06/23/17  Instruction Review Code  2- meets goals/outcomes       Nutrition I class: Heart Healthy Eating:  -Group instruction provided by PowerPoint slides, verbal discussion, and written materials to support subject matter. The instructor gives an explanation and review of the Therapeutic Lifestyle Changes diet recommendations, which includes a discussion on lipid goals, dietary fat, sodium, fiber, plant stanol/sterol esters, sugar, and the components of a well-balanced, healthy diet.   CARDIAC REHAB PHASE II EXERCISE from 06/23/2017 in [place]  Date  06/12/17  Educator  RD  Instruction Review Code  Not applicable      Nutrition II class: Lifestyle Skills:  -Group instruction provided by PowerPoint slides, verbal discussion, and written materials to support subject matter. The instructor gives an explanation and review of label reading, grocery shopping for heart health, heart healthy recipe modifications, and ways to make healthier choices when eating out.   Diabetes Question & Answer:  -Group instruction provided by PowerPoint slides, verbal discussion, and written materials to support subject matter. The instructor gives an explanation and review of diabetes co-morbidities, pre- and post-prandial blood glucose goals, pre-exercise blood glucose goals, signs, symptoms, and treatment of hypoglycemia and hyperglycemia, and foot care  basics.   CARDIAC REHAB PHASE II EXERCISE from 06/23/2017 in [place]  Date  06/09/17  Educator  RD  Instruction Review Code  2- meets goals/outcomes      Diabetes Blitz:  -Group instruction provided by PowerPoint slides, verbal discussion, and written materials to support subject matter. The instructor gives an explanation and review of the physiology behind type 1 and type 2 diabetes, diabetes medications and rational behind  using different medications, pre- and post-prandial blood glucose recommendations and Hemoglobin A1c goals, diabetes diet, and exercise including blood glucose guidelines for exercising safely.    Portion Distortion:  -Group instruction provided by PowerPoint slides, verbal discussion, written materials, and food models to support subject matter. The instructor gives an explanation of serving size versus portion size, changes in portions sizes over the last 20 years, and what consists of a serving from each food group.   Stress Management:  -Group instruction provided by verbal instruction, video, and written materials to support subject matter.  Instructors review role of stress in heart disease and how to cope with stress positively.     Exercising on Your Own:  -Group instruction provided by verbal instruction, power point, and written materials to support subject.  Instructors discuss benefits of exercise, components of exercise, frequency and intensity of exercise, and end points for exercise.  Also discuss use of nitroglycerin and activating EMS.  Review options of places to exercise outside of rehab.  Review guidelines for sex with heart disease.   Cardiac Drugs I:  -Group instruction provided by verbal instruction and written materials to support subject.  Instructor reviews cardiac drug classes: antiplatelets, anticoagulants, beta blockers, and statins.  Instructor discusses reasons, side effects, and lifestyle considerations for each drug class.   CARDIAC REHAB PHASE II EXERCISE from 06/23/2017 in [place]  Date  06/07/17  Instruction Review Code  2- meets goals/outcomes      Cardiac Drugs II:  -Group instruction provided by verbal instruction and written materials to support subject.  Instructor reviews cardiac drug classes: angiotensin converting enzyme inhibitors (ACE-I), angiotensin II receptor blockers (ARBs), nitrates, and calcium channel  blockers.  Instructor discusses reasons, side effects, and lifestyle considerations for each drug class.   Anatomy and Physiology of the Circulatory System:  Group verbal and written instruction and models provide basic cardiac anatomy and physiology, with the coronary electrical and arterial systems. Review of: AMI, Angina, Valve disease, Heart Failure, Peripheral Artery Disease, Cardiac Arrhythmia, Pacemakers, and the ICD.   Other Education:  -Group or individual verbal, written, or video instructions that support the educational goals of the cardiac rehab program.   Knowledge Questionnaire Score:     Knowledge Questionnaire Score - 06/01/17 1118      Knowledge Questionnaire Score   Pre Score 19/24      Core Components/Risk Factors/Patient Goals at Admission:     Personal Goals and Risk Factors at Admission - 06/01/17 1124      Core Components/Risk Factors/Patient Goals on Admission   Hypertension Yes   Intervention Provide education on lifestyle modifcations including regular physical activity/exercise, weight management, moderate sodium restriction and increased consumption of fresh fruit, vegetables, and low fat dairy, alcohol moderation, and smoking cessation.;Monitor prescription use compliance.   Expected Outcomes Short Term: Continued assessment and intervention until BP is < 140/12mm HG in hypertensive participants. < 130/23mm HG in hypertensive participants with diabetes, heart failure or chronic kidney disease.;Long Term: Maintenance of blood pressure at  goal levels.   Lipids Yes   Intervention Provide education and support for participant on nutrition & aerobic/resistive exercise along with prescribed medications to achieve LDL 70mg , HDL >40mg .   Expected Outcomes Long Term: Cholesterol controlled with medications as prescribed, with individualized exercise RX and with personalized nutrition plan. Value goals: LDL < 70mg , HDL > 40 mg.;Short Term: Participant states  understanding of desired cholesterol values and is compliant with medications prescribed. Participant is following exercise prescription and nutrition guidelines.   Stress Yes   Intervention Offer individual and/or small group education and counseling on adjustment to heart disease, stress management and health-related lifestyle change. Teach and support self-help strategies.;Refer participants experiencing significant psychosocial distress to appropriate mental health specialists for further evaluation and treatment. When possible, include family members and significant others in education/counseling sessions.   Expected Outcomes Short Term: Participant demonstrates changes in health-related behavior, relaxation and other stress management skills, ability to obtain effective social support, and compliance with psychotropic medications if prescribed.;Long Term: Emotional wellbeing is indicated by absence of clinically significant psychosocial distress or social isolation.      Core Components/Risk Factors/Patient Goals Review:      Goals and Risk Factor Review    Row Name 06/07/17 1457 07/04/17 1652           Core Components/Risk Factors/Patient Goals Review   Personal Goals Review Hypertension;Lipids;Stress Hypertension;Lipids;Stress      Review pt with CAD RF demonstrates eagerness to participate in CR exercise, nutrition and education opportunities.  pt with CAD RF demonstrates eagerness to participate in CR exercise, nutrition and education. pt BP WNL at rehab.       Expected Outcomes pt will participate in CR exercise, nutrition and lifestyle modification to decrease overall CAD RF.  pt will participate in CR exercise, nutrition and lifestyle modification to decrease overall CAD RF.          Core Components/Risk Factors/Patient Goals at Discharge (Final Review):      Goals and Risk Factor Review - 07/04/17 1652      Core Components/Risk Factors/Patient Goals Review   Personal Goals  Review Hypertension;Lipids;Stress   Review pt with CAD RF demonstrates eagerness to participate in CR exercise, nutrition and education. pt BP WNL at rehab.    Expected Outcomes pt will participate in CR exercise, nutrition and lifestyle modification to decrease overall CAD RF.       ITP Comments:     ITP Comments    Row Name 06/01/17 0277 06/07/17 1457 07/04/17 1651       ITP Comments Medical Director, Dr. Fransico Him Medical Director, Dr. Fransico Him 30day ITP review.  pt with good attendance and participation. pt recent absences due to scheduled travel        Comments:

## 2017-07-05 ENCOUNTER — Encounter (HOSPITAL_COMMUNITY)
Admission: RE | Admit: 2017-07-05 | Discharge: 2017-07-05 | Disposition: A | Discharge status: Home / Self Care | Payer: Medicare Other | Source: Ambulatory Visit | Attending: Cardiovascular Disease | Admitting: Cardiovascular Disease

## 2017-07-05 DIAGNOSIS — Z955 Presence of coronary angioplasty implant and graft: Secondary | ICD-10-CM

## 2017-07-05 DIAGNOSIS — I2119 ST elevation (STEMI) myocardial infarction involving other coronary artery of inferior wall: Secondary | ICD-10-CM | POA: Diagnosis not present

## 2017-07-05 DIAGNOSIS — I2111 ST elevation (STEMI) myocardial infarction involving right coronary artery: Secondary | ICD-10-CM

## 2017-07-07 ENCOUNTER — Encounter (HOSPITAL_COMMUNITY): Payer: Medicare Other

## 2017-07-10 ENCOUNTER — Encounter (HOSPITAL_COMMUNITY)
Admission: RE | Admit: 2017-07-10 | Discharge: 2017-07-10 | Disposition: A | Discharge status: Home / Self Care | Payer: Medicare Other | Source: Ambulatory Visit | Attending: Cardiovascular Disease | Admitting: Cardiovascular Disease

## 2017-07-10 DIAGNOSIS — I2119 ST elevation (STEMI) myocardial infarction involving other coronary artery of inferior wall: Secondary | ICD-10-CM | POA: Diagnosis not present

## 2017-07-10 DIAGNOSIS — Z955 Presence of coronary angioplasty implant and graft: Secondary | ICD-10-CM

## 2017-07-10 DIAGNOSIS — I2111 ST elevation (STEMI) myocardial infarction involving right coronary artery: Secondary | ICD-10-CM

## 2017-07-12 ENCOUNTER — Encounter (HOSPITAL_COMMUNITY)
Admission: RE | Admit: 2017-07-12 | Discharge: 2017-07-12 | Disposition: A | Discharge status: Home / Self Care | Payer: Medicare Other | Source: Ambulatory Visit | Attending: Cardiovascular Disease | Admitting: Cardiovascular Disease

## 2017-07-12 DIAGNOSIS — I2119 ST elevation (STEMI) myocardial infarction involving other coronary artery of inferior wall: Secondary | ICD-10-CM | POA: Diagnosis not present

## 2017-07-12 DIAGNOSIS — Z955 Presence of coronary angioplasty implant and graft: Secondary | ICD-10-CM | POA: Diagnosis not present

## 2017-07-12 DIAGNOSIS — I2111 ST elevation (STEMI) myocardial infarction involving right coronary artery: Secondary | ICD-10-CM

## 2017-07-14 ENCOUNTER — Encounter (HOSPITAL_COMMUNITY)
Admission: RE | Admit: 2017-07-14 | Discharge: 2017-07-14 | Disposition: A | Discharge status: Home / Self Care | Payer: Medicare Other | Source: Ambulatory Visit | Attending: Cardiovascular Disease | Admitting: Cardiovascular Disease

## 2017-07-14 DIAGNOSIS — Z955 Presence of coronary angioplasty implant and graft: Secondary | ICD-10-CM

## 2017-07-14 DIAGNOSIS — I2111 ST elevation (STEMI) myocardial infarction involving right coronary artery: Secondary | ICD-10-CM

## 2017-07-14 DIAGNOSIS — I2119 ST elevation (STEMI) myocardial infarction involving other coronary artery of inferior wall: Secondary | ICD-10-CM | POA: Diagnosis not present

## 2017-07-17 ENCOUNTER — Encounter (HOSPITAL_COMMUNITY)
Admission: RE | Admit: 2017-07-17 | Discharge: 2017-07-17 | Disposition: A | Discharge status: Home / Self Care | Payer: Medicare Other | Source: Ambulatory Visit | Attending: Cardiovascular Disease | Admitting: Cardiovascular Disease

## 2017-07-17 DIAGNOSIS — I2111 ST elevation (STEMI) myocardial infarction involving right coronary artery: Secondary | ICD-10-CM

## 2017-07-17 DIAGNOSIS — Z955 Presence of coronary angioplasty implant and graft: Secondary | ICD-10-CM

## 2017-07-17 DIAGNOSIS — M67431 Ganglion, right wrist: Secondary | ICD-10-CM | POA: Diagnosis not present

## 2017-07-17 DIAGNOSIS — I2119 ST elevation (STEMI) myocardial infarction involving other coronary artery of inferior wall: Secondary | ICD-10-CM | POA: Diagnosis not present

## 2017-07-19 ENCOUNTER — Encounter (HOSPITAL_COMMUNITY)
Admission: RE | Admit: 2017-07-19 | Discharge: 2017-07-19 | Disposition: A | Discharge status: Home / Self Care | Payer: Medicare Other | Source: Ambulatory Visit | Attending: Cardiovascular Disease | Admitting: Cardiovascular Disease

## 2017-07-19 DIAGNOSIS — Z955 Presence of coronary angioplasty implant and graft: Secondary | ICD-10-CM

## 2017-07-19 DIAGNOSIS — I2111 ST elevation (STEMI) myocardial infarction involving right coronary artery: Secondary | ICD-10-CM

## 2017-07-19 DIAGNOSIS — I2119 ST elevation (STEMI) myocardial infarction involving other coronary artery of inferior wall: Secondary | ICD-10-CM | POA: Diagnosis not present

## 2017-07-21 ENCOUNTER — Encounter (HOSPITAL_COMMUNITY)
Admission: RE | Admit: 2017-07-21 | Discharge: 2017-07-21 | Disposition: A | Discharge status: Home / Self Care | Payer: Medicare Other | Source: Ambulatory Visit | Attending: Cardiovascular Disease | Admitting: Cardiovascular Disease

## 2017-07-21 DIAGNOSIS — Z955 Presence of coronary angioplasty implant and graft: Secondary | ICD-10-CM | POA: Diagnosis not present

## 2017-07-21 DIAGNOSIS — I2111 ST elevation (STEMI) myocardial infarction involving right coronary artery: Secondary | ICD-10-CM

## 2017-07-21 DIAGNOSIS — I2119 ST elevation (STEMI) myocardial infarction involving other coronary artery of inferior wall: Secondary | ICD-10-CM | POA: Diagnosis not present

## 2017-07-24 ENCOUNTER — Encounter (HOSPITAL_COMMUNITY)
Admission: RE | Admit: 2017-07-24 | Discharge: 2017-07-24 | Disposition: A | Discharge status: Home / Self Care | Payer: Medicare Other | Source: Ambulatory Visit | Attending: Cardiovascular Disease | Admitting: Cardiovascular Disease

## 2017-07-24 DIAGNOSIS — Z955 Presence of coronary angioplasty implant and graft: Secondary | ICD-10-CM | POA: Diagnosis not present

## 2017-07-24 DIAGNOSIS — I2119 ST elevation (STEMI) myocardial infarction involving other coronary artery of inferior wall: Secondary | ICD-10-CM | POA: Diagnosis not present

## 2017-07-24 DIAGNOSIS — I2111 ST elevation (STEMI) myocardial infarction involving right coronary artery: Secondary | ICD-10-CM

## 2017-07-26 ENCOUNTER — Encounter (HOSPITAL_COMMUNITY)
Admission: RE | Admit: 2017-07-26 | Discharge: 2017-07-26 | Disposition: A | Discharge status: Home / Self Care | Payer: Medicare Other | Source: Ambulatory Visit | Attending: Cardiovascular Disease | Admitting: Cardiovascular Disease

## 2017-07-26 DIAGNOSIS — I2119 ST elevation (STEMI) myocardial infarction involving other coronary artery of inferior wall: Secondary | ICD-10-CM | POA: Diagnosis not present

## 2017-07-26 DIAGNOSIS — I2111 ST elevation (STEMI) myocardial infarction involving right coronary artery: Secondary | ICD-10-CM

## 2017-07-26 DIAGNOSIS — Z955 Presence of coronary angioplasty implant and graft: Secondary | ICD-10-CM

## 2017-07-28 ENCOUNTER — Encounter (HOSPITAL_COMMUNITY): Payer: Medicare Other

## 2017-07-28 NOTE — Progress Notes (Signed)
Cardiac Individual Treatment Plan  Patient Details  Name: Gregory Hale MRN: [id number] Date of Birth: 02-Mar-1951 Referring Provider:     CARDIAC REHAB PHASE II ORIENTATION from 06/01/2017 in [place]  Referring Provider  Lauree Chandler MD      Initial Encounter Date:    CARDIAC REHAB PHASE II ORIENTATION from 06/01/2017 in [place]  Date  06/01/17  Referring Provider  Lauree Chandler MD      Visit Diagnosis: ST elevation myocardial infarction involving right coronary artery [facility] [place])  Stented coronary artery  Patient's Home Medications on Admission:  Current Outpatient Prescriptions:  .  aspirin 81 MG chewable tablet, Chew 1 tablet (81 mg total) by mouth daily., Disp: 30 tablet, Rfl:  .  atorvastatin (LIPITOR) 80 MG tablet, Take 1 tablet (80 mg total) by mouth daily at 6 PM., Disp: 90 tablet, Rfl: 1 .  losartan (COZAAR) 25 MG tablet, Take 1 tablet (25 mg total) by mouth daily., Disp: 90 tablet, Rfl: 3 .  metoprolol tartrate (LOPRESSOR) 25 MG tablet, Take 0.5 tablets (12.5 mg total) by mouth 2 (two) times daily., Disp: 60 tablet, Rfl: 2 .  nitroGLYCERIN (NITROSTAT) 0.4 MG SL tablet, Place 1 tablet (0.4 mg total) under the tongue every 5 (five) minutes as needed., Disp: 25 tablet, Rfl: 3 .  ticagrelor (BRILINTA) 90 MG TABS tablet, Take 1 tablet (90 mg total) by mouth 2 (two) times daily., Disp: 60 tablet, Rfl: 10  Past Medical History: Past Medical History:  Diagnosis Date  . Arthritis   . Ascending aorta dilatation (HCC)   . Bradycardia    a. HR 40 in setting of acute inferior STEMI 03/2017.  [place] CAD in native artery    a. inf STEMI 03/2017 a/w hypotension and bradycardia -> s/p DES to RCA, otherwise mild disease in LAD, moderate disease in ostium of the moderate caliber diagonal branch, normal LVEF >65%  . Dilated aortic root ([place])   . STEMI involving right coronary artery ([place])    04/22/17 PCI/DES to mRCA,  normal EF  . Tobacco abuse    Quit in 1998    Tobacco Use: History  Smoking Status  . Former Smoker  Smokeless Tobacco  . Never Used    Labs: Recent [employer] for ITP Cardiac and Pulmonary Rehab Latest Ref Rng & Units 06/24/2011 05/27/2014 06/05/2015 04/23/2017 05/22/2017   Cholestrol 100 - 199 mg/dL 179 174 161 149 106   LDLCALC 0 - 99 mg/dL 112 104 93 90 45   HDL >39 mg/dL 54 56 59 47 53   Trlycerides 0 - 149 mg/dL 63 70 44 61 40   Hemoglobin A1c 4.0 - 6.0 % 5.4 5.1 5.4 - -      Capillary Blood Glucose: No results found for: GLUCAP   Exercise Target Goals:    Exercise Program Goal: Individual exercise prescription set with THRR, safety & activity barriers. Participant demonstrates ability to understand and report RPE using BORG scale, to self-measure pulse accurately, and to acknowledge the importance of the exercise prescription.  Exercise Prescription Goal: Starting with aerobic activity 30 plus minutes a day, 3 days per week for initial exercise prescription. Provide home exercise prescription and guidelines that participant acknowledges understanding prior to discharge.  Activity Barriers & Risk Stratification:     Activity Barriers & Cardiac Risk Stratification - 06/01/17 1119      Activity Barriers & Cardiac Risk Stratification   Activity Barriers  Back Problems;Arthritis   Cardiac Risk Stratification High      6 Minute Walk:     6 Minute Walk    Row Name 06/01/17 1118         6 Minute Walk   Phase Initial     Distance 1978 feet     Walk Time 6 minutes     # of Rest Breaks 0     MPH 3.7     METS 4.18     RPE 9     VO2 Peak 14.66     Symptoms No     Resting HR 59 bpm     Resting BP 110/62     Resting Oxygen Saturation  100 %     Exercise Oxygen Saturation  during 6 min walk 100 %     Max Ex. HR 78 bpm     Max Ex. BP 130/80     2 Minute Post BP 112/70        Oxygen Initial Assessment:   Oxygen Re-Evaluation:   Oxygen  Discharge (Final Oxygen Re-Evaluation):   Initial Exercise Prescription:     Initial Exercise Prescription - 06/01/17 1100      Date of Initial Exercise RX and Referring Provider   Date 06/01/17   Referring Provider Lauree Chandler MD     Treadmill   MPH 3.2   Grade 1   Minutes 10   METs 3.89     Bike   Level 1   Minutes 10   METs 3.1     NuStep   Level 4   SPM 90   Minutes 10   METs 3     Prescription Details   Frequency (times per week) 3   Duration Progress to 30 minutes of continuous aerobic without signs/symptoms of physical distress     Intensity   THRR 40-80% of Max Heartrate 62-124   Ratings of Perceived Exertion 11-13   Perceived Dyspnea 0-4     Progression   Progression Continue to progress workloads to maintain intensity without signs/symptoms of physical distress.     Resistance Training   Training Prescription Yes   Weight 5lbs   Reps 10-15      Perform Capillary Blood Glucose checks as needed.  Exercise Prescription Changes:     Exercise Prescription Changes    Row Name 06/05/17 1600 06/12/17 1357 06/27/17 1300 07/17/17 1500       Response to Exercise   Blood Pressure (Admit) 112/70 132/80 100/60 128/80    Blood Pressure (Exercise) 146/80 150/68 138/80 150/78    Blood Pressure (Exit) 100/64 120/80 118/70 112/72    Heart Rate (Admit) 61 bpm 70 bpm 65 bpm 67 bpm    Heart Rate (Exercise) 95 bpm 101 bpm 109 bpm 119 bpm    Heart Rate (Exit) 60 bpm 70 bpm 68 bpm 66 bpm    Rating of Perceived Exertion (Exercise) 12 11 11 12     Symptoms none none none none    Comments Pt was oriented to exercise equipment. Pt responded well to exercise session  -  -  -    Duration Continue with 30 min of aerobic exercise without signs/symptoms of physical distress. Continue with 30 min of aerobic exercise without signs/symptoms of physical distress. Continue with 30 min of aerobic exercise without signs/symptoms of physical distress. Continue with 30 min  of aerobic exercise without signs/symptoms of physical distress.    Intensity THRR unchanged THRR unchanged THRR unchanged THRR unchanged  Progression   Progression Continue to progress workloads to maintain intensity without signs/symptoms of physical distress. Continue to progress workloads to maintain intensity without signs/symptoms of physical distress. Continue to progress workloads to maintain intensity without signs/symptoms of physical distress. Continue to progress workloads to maintain intensity without signs/symptoms of physical distress.    Average METs 3.5 4.3 5.2 6      Resistance Training   Training Prescription Yes Yes Yes Yes    Weight 5lbs 8lbs 9lbs 9lbs    Reps 10-15 10-15 10-15 10-15    Time 10 Minutes 10 Minutes 10 Minutes 10 Minutes      Treadmill   MPH 3.2 3.2 3.6 3.6    Grade 2 2 3 3     Minutes 10 10 10 10     METs 4.33 4.33 5.25 5.25      Bike   Level 1 1.5 2.1 2.3    Minutes 10 10 10 10     METs 3.18 4.19 5.57 5.95      NuStep   Level 4 5 5 6     SPM 90 90 100 100    Minutes 10 10 10 10     METs 2.9 4.5 5 6.7      Home Exercise Plan   Plans to continue exercise at  - Home (comment)  walking Home (comment)  walking Home (comment)  walking    Frequency  - Add 3 additional days to program exercise sessions. Add 3 additional days to program exercise sessions. Add 3 additional days to program exercise sessions.    Initial Home Exercises Provided  - 06/12/17 06/12/17 06/12/17       Exercise Comments:     Exercise Comments    Row Name 06/05/17 1539 07/04/17 1442 07/25/17 1438       Exercise Comments Pt responded well to first exercise session. Pt was oriented to exercise equipment and did well with current Ex.Rx. Reviewed METs and goals. Pt is tolerating exercise well; will continue to monitor pt's progress and activity levels.  Reviewed METs and goals. Pt is tolerating exercise well; will continue to monitor pt's progress and activity levels.          Exercise Goals and Review:     Exercise Goals    Row Name 06/01/17 1123 06/01/17 1124           Exercise Goals   Increase Physical Activity Yes  -      Intervention Provide advice, education, support and counseling about physical activity/exercise needs.;Develop an individualized exercise prescription for aerobic and resistive training based on initial evaluation findings, risk stratification, comorbidities and participant's personal goals.  -      Expected Outcomes Achievement of increased cardiorespiratory fitness and enhanced flexibility, muscular endurance and strength shown through measurements of functional capacity and personal statement of participant.  -      Increase Strength and Stamina Yes -  return to golf      Intervention Provide advice, education, support and counseling about physical activity/exercise needs.;Develop an individualized exercise prescription for aerobic and resistive training based on initial evaluation findings, risk stratification, comorbidities and participant's personal goals.  -      Expected Outcomes Achievement of increased cardiorespiratory fitness and enhanced flexibility, muscular endurance and strength shown through measurements of functional capacity and personal statement of participant.  -      Able to understand and use rate of perceived exertion (RPE) scale Yes  -      Intervention Provide education and  explanation on how to use RPE scale  -      Expected Outcomes Short Term: Able to use RPE daily in rehab to express subjective intensity level;Long Term:  Able to use RPE to guide intensity level when exercising independently  -      Knowledge and understanding of Target Heart Rate Range (THRR) Yes  -      Intervention Provide education and explanation of THRR including how the numbers were predicted and where they are located for reference  -      Expected Outcomes Short Term: Able to state/look up THRR;Short Term: Able to use daily as  guideline for intensity in rehab;Long Term: Able to use THRR to govern intensity when exercising independently  -      Able to check pulse independently Yes  -      Intervention Provide education and demonstration on how to check pulse in carotid and radial arteries.;Review the importance of being able to check your own pulse for safety during independent exercise  -      Expected Outcomes Short Term: Able to explain why pulse checking is important during independent exercise;Long Term: Able to check pulse independently and accurately  -      Understanding of Exercise Prescription Yes  -      Intervention Provide education, explanation, and written materials on patient's individual exercise prescription  -      Expected Outcomes Short Term: Able to explain program exercise prescription;Long Term: Able to explain home exercise prescription to exercise independently  -         Exercise Goals Re-Evaluation :     Exercise Goals Re-Evaluation    Row Name 06/06/17 1540 06/12/17 0945 07/04/17 1443 07/25/17 1438       Exercise Goal Re-Evaluation   Exercise Goals Review Increase Physical Activity;Understanding of Exercise Prescription;Able to understand and use rate of perceived exertion (RPE) scale Increase Physical Activity;Able to understand and use rate of perceived exertion (RPE) scale;Knowledge and understanding of Target Heart Rate Range (THRR);Understanding of Exercise Prescription;Increase Strength and Stamina Increase Physical Activity;Able to understand and use rate of perceived exertion (RPE) scale;Knowledge and understanding of Target Heart Rate Range (THRR);Understanding of Exercise Prescription;Increase Strength and Stamina;Able to check pulse independently Increase Physical Activity;Able to understand and use rate of perceived exertion (RPE) scale;Knowledge and understanding of Target Heart Rate Range (THRR);Understanding of Exercise Prescription;Increase Strength and Stamina;Able to check  pulse independently    Comments  - Reviewed home exercise with pt today.  Pt plans to walk 2.26miles on Tues/Thurs/Sat. for exercise.  Reviewed THR, pulse, RPE, sign and symptoms, NTG use, and when to call 911 or MD.  Also discussed weather considerations and indoor options.  Pt voiced understanding. Pt tolerates WL increases very well; pt is averaging 5.2 METs and is exercising on the nustep machine at a level 5.  Pt states feeling great and having more energy. Pt is currently on a level 6 on nustep machine and is averaging 6.6 METs.     Expected Outcomes  - Pt will be compliant with HEP and improve in cardiorespiratory fitness/functional capacity. Pt will continue to improve in cardiorespiratory fitness and functional mobility.  Pt will continue to improve in cardiorespiratory fitness and functional mobility.         Discharge Exercise Prescription (Final Exercise Prescription Changes):     Exercise Prescription Changes - 07/17/17 1500      Response to Exercise   Blood Pressure (Admit) 128/80   Blood  Pressure (Exercise) 150/78   Blood Pressure (Exit) 112/72   Heart Rate (Admit) 67 bpm   Heart Rate (Exercise) 119 bpm   Heart Rate (Exit) 66 bpm   Rating of Perceived Exertion (Exercise) 12   Symptoms none   Duration Continue with 30 min of aerobic exercise without signs/symptoms of physical distress.   Intensity THRR unchanged     Progression   Progression Continue to progress workloads to maintain intensity without signs/symptoms of physical distress.   Average METs 6     Resistance Training   Training Prescription Yes   Weight 9lbs   Reps 10-15   Time 10 Minutes     Treadmill   MPH 3.6   Grade 3   Minutes 10   METs 5.25     Bike   Level 2.3   Minutes 10   METs 5.95     NuStep   Level 6   SPM 100   Minutes 10   METs 6.7     Home Exercise Plan   Plans to continue exercise at Home (comment)  walking   Frequency Add 3 additional days to program exercise sessions.    Initial Home Exercises Provided 06/12/17      Nutrition:  Target Goals: Understanding of nutrition guidelines, daily intake of sodium 1500mg , cholesterol 200mg , calories 30% from fat and 7% or less from saturated fats, daily to have 5 or more servings of fruits and vegetables.  Biometrics:     Pre Biometrics - 06/01/17 1123      Pre Biometrics   Waist Circumference 37 inches   Hip Circumference 41 inches   Waist to Hip Ratio 0.9 %   Triceps Skinfold 15 mm   % Body Fat 25.7 %   Grip Strength 48 kg   Flexibility 16 in   Single Leg Stand 20 seconds       Nutrition Therapy Plan and Nutrition Goals:     Nutrition Therapy & Goals - 06/01/17 1041      Nutrition Therapy   Diet Therapeutic Lifestyle Changes     Personal Nutrition Goals   Nutrition Goal Pt to identify and limit food sources of saturated fat, trans fat, and sodium   Personal Goal #2 Pt to identify food quantities necessary to achieve weight loss of 6-15 lb at graduation from cardiac rehab. Goal wt of 175 lb desired.      Intervention Plan   Intervention Prescribe, educate and counsel regarding individualized specific dietary modifications aiming towards targeted core components such as weight, hypertension, lipid management, diabetes, heart failure and other comorbidities.   Expected Outcomes Short Term Goal: Understand basic principles of dietary content, such as calories, fat, sodium, cholesterol and nutrients.;Long Term Goal: Adherence to prescribed nutrition plan.      Nutrition Discharge: Nutrition Scores:     Nutrition Assessments - 06/01/17 1041      MEDFICTS Scores   Pre Score 52      Nutrition Goals Re-Evaluation:   Nutrition Goals Re-Evaluation:   Nutrition Goals Discharge (Final Nutrition Goals Re-Evaluation):   Psychosocial: Target Goals: Acknowledge presence or absence of significant depression and/or stress, maximize coping skills, provide positive support system. Participant is  able to verbalize types and ability to use techniques and skills needed for reducing stress and depression.  Initial Review & Psychosocial Screening:     Initial Psych Review & Screening - 06/01/17 1229      Initial Review   Current issues with Current Stress Concerns;Current Anxiety/Panic  Source of Stress Concerns Chronic Illness   Comments Recently retired 2 months ago, health related anxiety due to the sudden onset of his cardiac event     Family Dynamics   Good Support System? Yes     Barriers   Psychosocial barriers to participate in program The patient should benefit from training in stress management and relaxation.      Quality of Life Scores:     Quality of Life - 07/05/17 0911      Quality of Life Scores   Health/Function Pre 26 %   Socioeconomic Pre 29.17 %   Psych/Spiritual Pre 27.43 %   Family Pre 30 %   GLOBAL Pre 27.48 %  QOL reviewed with pt.  health related anxiety from recent cardiac event.  pt offered emotional support and reassurance,.      PHQ-9: Recent Review Flowsheet Data    Depression screen [facility] 2/9 06/05/2017 06/23/2014   Decreased Interest 0 0   Down, Depressed, Hopeless 0 0   PHQ - 2 Score 0 0     Interpretation of Total Score  Total Score Depression Severity:  1-4 = Minimal depression, 5-9 = Mild depression, 10-14 = Moderate depression, 15-19 = Moderately severe depression, 20-27 = Severe depression   Psychosocial Evaluation and Intervention:     Psychosocial Evaluation - 06/05/17 0942      Psychosocial Evaluation & Interventions   Interventions Encouraged to exercise with the program and follow exercise prescription;Stress management education   Comments pt exhibits health related stress/anxiety, otherwise no psychosocial needs idenitified, no interventions necessary    Expected Outcomes pt will exhibit positive outlook with good coping skills.    Continue Psychosocial Services  No Follow up required      Psychosocial  Re-Evaluation:     Psychosocial Re-Evaluation    [place] Name 06/07/17 1459 07/04/17 1652 07/25/17 1509         Psychosocial Re-Evaluation   Current issues with Current Stress Concerns Current Stress Concerns Current Stress Concerns     Comments pt with health related anxiety. from recent cardiac event.  pt with health related anxiety. from recent cardiac event.  pt with health related anxiety. from recent cardiac event.      Expected Outcomes pt will demonstrate positive coping skills with good outlook  pt will demonstrate positive coping skills with good outlook  pt will demonstrate positive coping skills with good outlook      Interventions Encouraged to attend Cardiac Rehabilitation for the exercise;Stress management education;Relaxation education Encouraged to attend Cardiac Rehabilitation for the exercise;Stress management education;Relaxation education Encouraged to attend Cardiac Rehabilitation for the exercise;Stress management education;Relaxation education     Continue Psychosocial Services  No Follow up required No Follow up required No Follow up required     Comments  - Recently retired 2 months ago, health related anxiety due to the sudden onset of his cardiac event Recently retired 2 months ago, health related anxiety due to the sudden onset of his cardiac event       Initial Review   Source of Stress Concerns  - Chronic Illness Chronic Illness        Psychosocial Discharge (Final Psychosocial Re-Evaluation):     Psychosocial Re-Evaluation - 07/25/17 1509      Psychosocial Re-Evaluation   Current issues with Current Stress Concerns   Comments pt with health related anxiety. from recent cardiac event.    Expected Outcomes pt will demonstrate positive coping skills with good outlook    Interventions  Encouraged to attend Cardiac Rehabilitation for the exercise;Stress management education;Relaxation education   Continue Psychosocial Services  No Follow up required   Comments  Recently retired 2 months ago, health related anxiety due to the sudden onset of his cardiac event     Initial Review   Source of Stress Concerns Chronic Illness      Vocational Rehabilitation: Provide vocational rehab assistance to qualifying candidates.   Vocational Rehab Evaluation & Intervention:     Vocational Rehab - 06/01/17 1234      Initial Vocational Rehab Evaluation & Intervention   Assessment shows need for Vocational Rehabilitation No      Education: Education Goals: Education classes will be provided on a weekly basis, covering required topics. Participant will state understanding/return demonstration of topics presented.  Learning Barriers/Preferences:     Learning Barriers/Preferences - 06/01/17 1117      Learning Barriers/Preferences   Learning Barriers None   Learning Preferences Skilled Demonstration      Education Topics: Count Your Pulse:  -Group instruction provided by verbal instruction, demonstration, patient participation and written materials to support subject.  Instructors address importance of being able to find your pulse and how to count your pulse when at home without a heart monitor.  Patients get hands on experience counting their pulse with staff help and individually.   Heart Attack, Angina, and Risk Factor Modification:  -Group instruction provided by verbal instruction, video, and written materials to support subject.  Instructors address signs and symptoms of angina and heart attacks.    Also discuss risk factors for heart disease and how to make changes to improve heart health risk factors.   Functional Fitness:  -Group instruction provided by verbal instruction, demonstration, patient participation, and written materials to support subject.  Instructors address safety measures for doing things around the house.  Discuss how to get up and down off the floor, how to pick things up properly, how to safely get out of a chair without  assistance, and balance training.   CARDIAC REHAB PHASE II EXERCISE from 07/05/2017 in [place]  Date  06/16/17  Instruction Review Code  2- meets goals/outcomes      Meditation and Mindfulness:  -Group instruction provided by verbal instruction, patient participation, and written materials to support subject.  Instructor addresses importance of mindfulness and meditation practice to help reduce stress and improve awareness.  Instructor also leads participants through a meditation exercise.    Stretching for Flexibility and Mobility:  -Group instruction provided by verbal instruction, patient participation, and written materials to support subject.  Instructors lead participants through series of stretches that are designed to increase flexibility thus improving mobility.  These stretches are additional exercise for major muscle groups that are typically performed during regular warm up and cool down.   Hands Only CPR:  -Group verbal, video, and participation provides a basic overview of AHA guidelines for community CPR. Role-play of emergencies allow participants the opportunity to practice calling for help and chest compression technique with discussion of AED use.   Hypertension: -Group verbal and written instruction that provides a basic overview of hypertension including the most recent diagnostic guidelines, risk factor reduction with self-care instructions and medication management.   CARDIAC REHAB PHASE II EXERCISE from 07/05/2017 in [place]  Date  06/23/17  Instruction Review Code  2- meets goals/outcomes       Nutrition I class: Heart Healthy Eating:  -Group instruction provided by PowerPoint  slides, verbal discussion, and written materials to support subject matter. The instructor gives an explanation and review of the Therapeutic Lifestyle Changes diet recommendations, which includes a discussion on lipid  goals, dietary fat, sodium, fiber, plant stanol/sterol esters, sugar, and the components of a well-balanced, healthy diet.   CARDIAC REHAB PHASE II EXERCISE from 07/05/2017 in [place]  Date  06/12/17  Educator  RD  Instruction Review Code  Not applicable      Nutrition II class: Lifestyle Skills:  -Group instruction provided by PowerPoint slides, verbal discussion, and written materials to support subject matter. The instructor gives an explanation and review of label reading, grocery shopping for heart health, heart healthy recipe modifications, and ways to make healthier choices when eating out.   Diabetes Question & Answer:  -Group instruction provided by PowerPoint slides, verbal discussion, and written materials to support subject matter. The instructor gives an explanation and review of diabetes co-morbidities, pre- and post-prandial blood glucose goals, pre-exercise blood glucose goals, signs, symptoms, and treatment of hypoglycemia and hyperglycemia, and foot care basics.   CARDIAC REHAB PHASE II EXERCISE from 07/05/2017 in [place]  Date  06/09/17  Educator  RD  Instruction Review Code  2- meets goals/outcomes      Diabetes Blitz:  -Group instruction provided by PowerPoint slides, verbal discussion, and written materials to support subject matter. The instructor gives an explanation and review of the physiology behind type 1 and type 2 diabetes, diabetes medications and rational behind using different medications, pre- and post-prandial blood glucose recommendations and Hemoglobin A1c goals, diabetes diet, and exercise including blood glucose guidelines for exercising safely.    Portion Distortion:  -Group instruction provided by PowerPoint slides, verbal discussion, written materials, and food models to support subject matter. The instructor gives an explanation of serving size versus portion size, changes in  portions sizes over the last 20 years, and what consists of a serving from each food group.   Stress Management:  -Group instruction provided by verbal instruction, video, and written materials to support subject matter.  Instructors review role of stress in heart disease and how to cope with stress positively.     Exercising on Your Own:  -Group instruction provided by verbal instruction, power point, and written materials to support subject.  Instructors discuss benefits of exercise, components of exercise, frequency and intensity of exercise, and end points for exercise.  Also discuss use of nitroglycerin and activating EMS.  Review options of places to exercise outside of rehab.  Review guidelines for sex with heart disease.   Cardiac Drugs I:  -Group instruction provided by verbal instruction and written materials to support subject.  Instructor reviews cardiac drug classes: antiplatelets, anticoagulants, beta blockers, and statins.  Instructor discusses reasons, side effects, and lifestyle considerations for each drug class.   CARDIAC REHAB PHASE II EXERCISE from 07/05/2017 in [place]  Date  06/07/17  Instruction Review Code  2- meets goals/outcomes      Cardiac Drugs II:  -Group instruction provided by verbal instruction and written materials to support subject.  Instructor reviews cardiac drug classes: angiotensin converting enzyme inhibitors (ACE-I), angiotensin II receptor blockers (ARBs), nitrates, and calcium channel blockers.  Instructor discusses reasons, side effects, and lifestyle considerations for each drug class.   CARDIAC REHAB PHASE II EXERCISE from 07/05/2017 in [place]  Date  07/05/17  Instruction Review Code  2- meets goals/outcomes  Anatomy and Physiology of the Circulatory System:  Group verbal and written instruction and models provide basic cardiac anatomy and physiology, with the  coronary electrical and arterial systems. Review of: AMI, Angina, Valve disease, Heart Failure, Peripheral Artery Disease, Cardiac Arrhythmia, Pacemakers, and the ICD.   Other Education:  -Group or individual verbal, written, or video instructions that support the educational goals of the cardiac rehab program.   Knowledge Questionnaire Score:     Knowledge Questionnaire Score - 06/01/17 1118      Knowledge Questionnaire Score   Pre Score 19/24      Core Components/Risk Factors/Patient Goals at Admission:     Personal Goals and Risk Factors at Admission - 06/01/17 1124      Core Components/Risk Factors/Patient Goals on Admission   Hypertension Yes   Intervention Provide education on lifestyle modifcations including regular physical activity/exercise, weight management, moderate sodium restriction and increased consumption of fresh fruit, vegetables, and low fat dairy, alcohol moderation, and smoking cessation.;Monitor prescription use compliance.   Expected Outcomes Short Term: Continued assessment and intervention until BP is < 140/55mm HG in hypertensive participants. < 130/52mm HG in hypertensive participants with diabetes, heart failure or chronic kidney disease.;Long Term: Maintenance of blood pressure at goal levels.   Lipids Yes   Intervention Provide education and support for participant on nutrition & aerobic/resistive exercise along with prescribed medications to achieve LDL 70mg , HDL >40mg .   Expected Outcomes Long Term: Cholesterol controlled with medications as prescribed, with individualized exercise RX and with personalized nutrition plan. Value goals: LDL < 70mg , HDL > 40 mg.;Short Term: Participant states understanding of desired cholesterol values and is compliant with medications prescribed. Participant is following exercise prescription and nutrition guidelines.   Stress Yes   Intervention Offer individual and/or small group education and counseling on adjustment to  heart disease, stress management and health-related lifestyle change. Teach and support self-help strategies.;Refer participants experiencing significant psychosocial distress to appropriate mental health specialists for further evaluation and treatment. When possible, include family members and significant others in education/counseling sessions.   Expected Outcomes Short Term: Participant demonstrates changes in health-related behavior, relaxation and other stress management skills, ability to obtain effective social support, and compliance with psychotropic medications if prescribed.;Long Term: Emotional wellbeing is indicated by absence of clinically significant psychosocial distress or social isolation.      Core Components/Risk Factors/Patient Goals Review:      Goals and Risk Factor Review    Row Name 06/07/17 1457 07/04/17 1652 07/25/17 1509         Core Components/Risk Factors/Patient Goals Review   Personal Goals Review Hypertension;Lipids;Stress Hypertension;Lipids;Stress Hypertension;Lipids;Stress     Review pt with CAD RF demonstrates eagerness to participate in CR exercise, nutrition and education opportunities.  pt with CAD RF demonstrates eagerness to participate in CR exercise, nutrition and education. pt BP WNL at rehab.  pt with CAD RF demonstrates eagerness to participate in CR exercise, nutrition and education. pt BP WNL at rehab.      Expected Outcomes pt will participate in CR exercise, nutrition and lifestyle modification to decrease overall CAD RF.  pt will participate in CR exercise, nutrition and lifestyle modification to decrease overall CAD RF.  pt will participate in CR exercise, nutrition and lifestyle modification to decrease overall CAD RF.         Core Components/Risk Factors/Patient Goals at Discharge (Final Review):      Goals and Risk Factor Review - 07/25/17 1509      Core Components/Risk  Factors/Patient Goals Review   Personal Goals Review  Hypertension;Lipids;Stress   Review pt with CAD RF demonstrates eagerness to participate in CR exercise, nutrition and education. pt BP WNL at rehab.    Expected Outcomes pt will participate in CR exercise, nutrition and lifestyle modification to decrease overall CAD RF.       ITP Comments:     ITP Comments    Row Name 06/01/17 7897 06/07/17 1457 07/04/17 1651 07/25/17 1509     ITP Comments Medical Director, Dr. Fransico Him Medical Director, Dr. Fransico Him 30day ITP review.  pt with good attendance and participation. pt recent absences due to scheduled travel 30day ITP review.  pt with good attendance and participation.        Comments:

## 2017-07-31 ENCOUNTER — Encounter (HOSPITAL_COMMUNITY)
Admission: RE | Admit: 2017-07-31 | Discharge: 2017-07-31 | Disposition: A | Discharge status: Home / Self Care | Payer: Medicare Other | Source: Ambulatory Visit | Attending: Cardiovascular Disease | Admitting: Cardiovascular Disease

## 2017-07-31 DIAGNOSIS — I213 ST elevation (STEMI) myocardial infarction of unspecified site: Secondary | ICD-10-CM | POA: Diagnosis present

## 2017-07-31 DIAGNOSIS — Z955 Presence of coronary angioplasty implant and graft: Secondary | ICD-10-CM | POA: Diagnosis not present

## 2017-07-31 DIAGNOSIS — I2111 ST elevation (STEMI) myocardial infarction involving right coronary artery: Secondary | ICD-10-CM

## 2017-07-31 DIAGNOSIS — I2119 ST elevation (STEMI) myocardial infarction involving other coronary artery of inferior wall: Secondary | ICD-10-CM | POA: Insufficient documentation

## 2017-08-02 ENCOUNTER — Encounter (HOSPITAL_COMMUNITY)
Admission: RE | Admit: 2017-08-02 | Discharge: 2017-08-02 | Disposition: A | Discharge status: Home / Self Care | Payer: Medicare Other | Source: Ambulatory Visit | Attending: Cardiovascular Disease | Admitting: Cardiovascular Disease

## 2017-08-02 DIAGNOSIS — Z955 Presence of coronary angioplasty implant and graft: Secondary | ICD-10-CM | POA: Diagnosis not present

## 2017-08-02 DIAGNOSIS — I2119 ST elevation (STEMI) myocardial infarction involving other coronary artery of inferior wall: Secondary | ICD-10-CM | POA: Diagnosis not present

## 2017-08-04 ENCOUNTER — Encounter (HOSPITAL_COMMUNITY)
Admission: RE | Admit: 2017-08-04 | Discharge: 2017-08-04 | Disposition: A | Discharge status: Home / Self Care | Payer: Medicare Other | Source: Ambulatory Visit | Attending: Cardiovascular Disease | Admitting: Cardiovascular Disease

## 2017-08-04 ENCOUNTER — Encounter: Payer: Self-pay | Admitting: Cardiovascular Disease

## 2017-08-04 ENCOUNTER — Ambulatory Visit (INDEPENDENT_AMBULATORY_CARE_PROVIDER_SITE_OTHER): Payer: Medicare Other | Admitting: Cardiovascular Disease

## 2017-08-04 VITALS — BP 126/80 | HR 51 | Ht 70.0 in | Wt 193.6 lb

## 2017-08-04 DIAGNOSIS — Z955 Presence of coronary angioplasty implant and graft: Secondary | ICD-10-CM

## 2017-08-04 DIAGNOSIS — I77810 Thoracic aortic ectasia: Secondary | ICD-10-CM | POA: Diagnosis not present

## 2017-08-04 DIAGNOSIS — I2111 ST elevation (STEMI) myocardial infarction involving right coronary artery: Secondary | ICD-10-CM

## 2017-08-04 DIAGNOSIS — I2119 ST elevation (STEMI) myocardial infarction involving other coronary artery of inferior wall: Secondary | ICD-10-CM | POA: Diagnosis not present

## 2017-08-04 DIAGNOSIS — I2510 Atherosclerotic heart disease of native coronary artery without angina pectoris: Secondary | ICD-10-CM | POA: Diagnosis not present

## 2017-08-04 NOTE — Patient Instructions (Signed)
Medication Instructions:  Your physician recommends that you continue on your current medications as directed. Please refer to the Current Medication list given to you today.   Labwork: Lab work to be done today--BMP  Testing/Procedures: Non-Cardiac CT scanning, (CAT scanning), is a noninvasive, special x-ray that produces cross-sectional images of the body using x-rays and a computer. CT scans help physicians diagnose and treat medical conditions. For some CT exams, a contrast material is used to enhance visibility in the area of the body being studied. CT scans provide greater clarity and reveal more details than regular x-ray exams.    Follow-Up: Your physician recommends that you schedule a follow-up appointment in: 6 months.  Please call our office in about  3 month to schedule this appointment    Any Other Special Instructions Will Be Listed Below (If Applicable).     If you need a refill on your cardiac medications before your next appointment, please call your pharmacy.

## 2017-08-04 NOTE — Progress Notes (Signed)
Chief Complaint  Patient presents with  . Coronary Artery Disease   History of Present Illness: 66 yo male with history of CAD, former tobacco abuse, OA and thoracic aortic aneurysm here today for cardiac follow up. He was admitted to [facility] 04/22/17 with an acute inferior STEMI. His RCA was occluded and was treated with a drug eluting stent. There was mild disease noted in the LAD and moderate disease in the Diagonal branch. LVEF=65%. Echo 04/23/17 with normal LV systolic function, BHAL=93-79%, no valve disease, mild dilation of aortic root. He did well following his MI.   She is here today for follow up. The patient denies any chest pain, dyspnea, palpitations, lower extremity edema, orthopnea, PND, dizziness, near syncope or syncope.   Primary Care Physician: Marletta Lor, MD   Past Medical History:  Diagnosis Date  . Arthritis   . Ascending aorta dilatation (HCC)   . Bradycardia    a. HR 40 in setting of acute inferior STEMI 03/2017.  Marland Kitchen CAD in native artery    a. inf STEMI 03/2017 a/w hypotension and bradycardia -> s/p DES to RCA, otherwise mild disease in LAD, moderate disease in ostium of the moderate caliber diagonal branch, normal LVEF >65%  . Dilated aortic root ([place])   . STEMI involving right coronary artery ([place])    04/22/17 PCI/DES to mRCA, normal EF  . Tobacco abuse    Quit in 1998    Past Surgical History:  Procedure Laterality Date  . parotid Left 1980  . TONSILLECTOMY     as a child    Current Outpatient Medications  Medication Sig Dispense Refill  . aspirin 81 MG chewable tablet Chew 1 tablet (81 mg total) by mouth daily. 30 tablet   . atorvastatin (LIPITOR) 80 MG tablet Take 1 tablet (80 mg total) by mouth daily at 6 PM. 90 tablet 1  . metoprolol tartrate (LOPRESSOR) 25 MG tablet Take 0.5 tablets (12.5 mg total) by mouth 2 (two) times daily. 60 tablet 2  . nitroGLYCERIN (NITROSTAT) 0.4 MG SL tablet Place 1 tablet (0.4 mg total) under the tongue every 5  (five) minutes as needed. 25 tablet 3  . ticagrelor (BRILINTA) 90 MG TABS tablet Take 1 tablet (90 mg total) by mouth 2 (two) times daily. 60 tablet 10  . losartan (COZAAR) 25 MG tablet Take 1 tablet (25 mg total) by mouth daily. 90 tablet 3   No current facility-administered medications for this visit.     No Known Allergies  Social History   Socioeconomic History  . Marital status: Married    Spouse name: Not on file  . Number of children: Not on file  . Years of education: Not on file  . Highest education level: Not on file  Social Needs  . Financial resource strain: Not on file  . Food insecurity - worry: Not on file  . Food insecurity - inability: Not on file  . Transportation needs - medical: Not on file  . Transportation needs - non-medical: Not on file  Occupational History  . Not on file  Tobacco Use  . Smoking status: Former Research scientist (life sciences)  . Smokeless tobacco: Never Used  Substance and Sexual Activity  . Alcohol use: Yes    Alcohol/week: 1.2 - 1.8 oz    Types: 2 - 3 Cans of beer per week  . Drug use: No  . Sexual activity: Not on file  Other Topics Concern  . Not on file  Social History Narrative  .  Not on file    Family History  Problem Relation Age of Onset  . CAD Brother     Review of Systems:  As stated in the HPI and otherwise negative.   BP 126/80   Pulse (!) 51   Ht 5\' 10"  (1.778 m)   Wt 193 lb 9.6 oz (87.8 kg)   SpO2 98%   BMI 27.78 kg/m   Physical Examination: General: Well developed, well nourished, NAD  HEENT: OP clear, mucus membranes moist  SKIN: warm, dry. No rashes. Neuro: No focal deficits  Musculoskeletal: Muscle strength 5/5 all ext  Psychiatric: Mood and affect normal  Neck: No JVD, no carotid bruits, no thyromegaly, no lymphadenopathy.  Lungs:Clear bilaterally, no wheezes, rhonci, crackles Cardiovascular: Regular rate and rhythm. No murmurs, gallops or rubs. Abdomen:Soft. Bowel sounds present. Non-tender.  Extremities: No lower  extremity edema. Pulses are 2 + in the bilateral DP/PT.  Echo 04/23/17: - Left ventricle: The cavity size was normal. Wall thickness was   increased in a pattern of mild LVH. Systolic function was normal.   The estimated ejection fraction was in the range of 60% to 65%.   Wall motion was normal; there were no regional wall motion   abnormalities. Doppler parameters are consistent with abnormal   left ventricular relaxation (grade 1 diastolic dysfunction). - Aortic root: The aortic root was mildly dilated. - Ascending aorta: The ascending aorta was mildly dilated. - Mitral valve: There was trivial regurgitation. - Right atrium: Central venous pressure (est): 3 mm Hg. - Atrial septum: No defect or patent foramen ovale was identified. - Tricuspid valve: There was mild regurgitation. - Pulmonary arteries: PA peak pressure: 24 mm Hg (S). - Pericardium, extracardiac: There was no pericardial effusion.  Impressions:  - Mild LVH with LVEF 60-65% and grade 1 diastolic dysfunction.   Trivial mitral regurgitation. Mildly dilated aortic root and   ascending aorta. Mild tricuspid regurgitation with PASP 24 mmHg.  Cardiac cath 04/22/17:  Mid LAD lesion, 30 %stenosed.  Ost 2nd Diag lesion, 70 %stenosed.  A STENT RESOLUTE ONYX 5.0X18 drug eluting stent was successfully placed.  Prox RCA lesion, 100 %stenosed.  Post intervention, there is a 0% residual stenosis.  The left ventricular systolic function is normal.  LV end diastolic pressure is normal.  The left ventricular ejection fraction is greater than 65% by visual estimate.  There is no mitral valve regurgitation.  Diagnostic Diagram       Post-Intervention Diagram          EKG:  EKG is not ordered today. The ekg ordered today demonstrates   Recent Labs: 04/24/2017: Hemoglobin 14.8; Platelets 145 05/22/2017: ALT 18 06/13/2017: BUN 15; Creatinine, Ser 1.06; Potassium 4.7; Sodium 140   Lipid Panel    Component Value  Date/Time   CHOL 106 05/22/2017 0814   CHOL 179 06/24/2011   TRIG 40 05/22/2017 0814   TRIG 63 06/24/2011   HDL 53 05/22/2017 0814   CHOLHDL 2.0 05/22/2017 0814   CHOLHDL 3.2 04/23/2017 0310   VLDL 12 04/23/2017 0310   LDLCALC 45 05/22/2017 0814     Wt Readings from Last 3 Encounters:  08/04/17 193 lb 9.6 oz (87.8 kg)  06/01/17 191 lb 2.2 oz (86.7 kg)  05/03/17 191 lb 3.2 oz (86.7 kg)     Other studies Reviewed: Additional studies/ records that were reviewed today include: . Review of the above records demonstrates:    Assessment and Plan:   1. CAD without angina: He is now  three months post inferior MI. He is doing well. No chest pain suggestive of angina. Continue dual anti-platelet therapy with ASA and Brilinta for one year post MI/stenting. Continue statin, beta blocker and ARB.  2. Thoracic aortic aneurysm: He was noted to have mild dilation of the aortic root on echo. Will arrange chest CTA to better assess the aortic root and ascending aorta.   Current medicines are reviewed at length with the patient today.  The patient does not have concerns regarding medicines.  The following changes have been made:  no change  Labs/ tests ordered today include:   Orders Placed This Encounter  Procedures  . CT ANGIO CHEST AORTA W &/OR WO CONTRAST  . Basic Metabolic Panel (BMET)     Disposition:   FU with me in 6 months   Signed, Lauree Chandler, MD 08/04/2017 [facility] [address], [place], [place]  [address] Phone: [phone]; Fax: [phone]

## 2017-08-05 LAB — BASIC METABOLIC PANEL WITH GFR
BUN/Creatinine Ratio: 18 (ref 10–24)
BUN: 15 mg/dL (ref 8–27)
CO2: 24 mmol/L (ref 20–29)
Calcium: 8.6 mg/dL (ref 8.6–10.2)
Chloride: 104 mmol/L (ref 96–106)
Creatinine, Ser: 0.84 mg/dL (ref 0.76–1.27)
GFR calc Af Amer: 106 mL/min/1.73
GFR calc non Af Amer: 92 mL/min/1.73
Glucose: 77 mg/dL (ref 65–99)
Potassium: 4.5 mmol/L (ref 3.5–5.2)
Sodium: 143 mmol/L (ref 134–144)

## 2017-08-07 ENCOUNTER — Encounter (HOSPITAL_COMMUNITY)
Admission: RE | Admit: 2017-08-07 | Discharge: 2017-08-07 | Disposition: A | Discharge status: Home / Self Care | Payer: Medicare Other | Source: Ambulatory Visit | Attending: Cardiovascular Disease | Admitting: Cardiovascular Disease

## 2017-08-07 DIAGNOSIS — I2119 ST elevation (STEMI) myocardial infarction involving other coronary artery of inferior wall: Secondary | ICD-10-CM | POA: Diagnosis not present

## 2017-08-07 DIAGNOSIS — Z955 Presence of coronary angioplasty implant and graft: Secondary | ICD-10-CM

## 2017-08-07 DIAGNOSIS — I2111 ST elevation (STEMI) myocardial infarction involving right coronary artery: Secondary | ICD-10-CM

## 2017-08-09 ENCOUNTER — Encounter (HOSPITAL_COMMUNITY)
Admission: RE | Admit: 2017-08-09 | Discharge: 2017-08-09 | Disposition: A | Discharge status: Home / Self Care | Payer: Medicare Other | Source: Ambulatory Visit | Attending: Cardiovascular Disease | Admitting: Cardiovascular Disease

## 2017-08-09 DIAGNOSIS — Z955 Presence of coronary angioplasty implant and graft: Secondary | ICD-10-CM

## 2017-08-09 DIAGNOSIS — I2119 ST elevation (STEMI) myocardial infarction involving other coronary artery of inferior wall: Secondary | ICD-10-CM | POA: Diagnosis not present

## 2017-08-09 DIAGNOSIS — I2111 ST elevation (STEMI) myocardial infarction involving right coronary artery: Secondary | ICD-10-CM

## 2017-08-10 ENCOUNTER — Ambulatory Visit (INDEPENDENT_AMBULATORY_CARE_PROVIDER_SITE_OTHER)
Admission: RE | Admit: 2017-08-10 | Discharge: 2017-08-10 | Disposition: A | Discharge status: Home / Self Care | Payer: Medicare Other | Source: Ambulatory Visit | Attending: Cardiovascular Disease | Admitting: Cardiovascular Disease

## 2017-08-10 DIAGNOSIS — I77810 Thoracic aortic ectasia: Secondary | ICD-10-CM

## 2017-08-10 DIAGNOSIS — I714 Abdominal aortic aneurysm, without rupture: Secondary | ICD-10-CM | POA: Diagnosis not present

## 2017-08-10 MED ORDER — IOPAMIDOL (ISOVUE-370) INJECTION 76%
100.0000 mL | Freq: Once | INTRAVENOUS | Status: AC | PRN
  Administered 2017-08-10: 100 mL via INTRAVENOUS

## 2017-08-11 ENCOUNTER — Encounter (HOSPITAL_COMMUNITY)
Admission: RE | Admit: 2017-08-11 | Discharge: 2017-08-11 | Disposition: A | Discharge status: Home / Self Care | Payer: Medicare Other | Source: Ambulatory Visit | Attending: Cardiovascular Disease | Admitting: Cardiovascular Disease

## 2017-08-11 DIAGNOSIS — I2111 ST elevation (STEMI) myocardial infarction involving right coronary artery: Secondary | ICD-10-CM

## 2017-08-11 DIAGNOSIS — I2119 ST elevation (STEMI) myocardial infarction involving other coronary artery of inferior wall: Secondary | ICD-10-CM | POA: Diagnosis not present

## 2017-08-11 DIAGNOSIS — Z955 Presence of coronary angioplasty implant and graft: Secondary | ICD-10-CM

## 2017-08-14 ENCOUNTER — Encounter (HOSPITAL_COMMUNITY): Payer: Medicare Other

## 2017-08-16 ENCOUNTER — Encounter (HOSPITAL_COMMUNITY): Payer: Medicare Other

## 2017-08-18 ENCOUNTER — Encounter (HOSPITAL_COMMUNITY): Payer: Medicare Other

## 2017-08-21 ENCOUNTER — Encounter (HOSPITAL_COMMUNITY)
Admission: RE | Admit: 2017-08-21 | Discharge: 2017-08-21 | Disposition: A | Discharge status: Home / Self Care | Payer: Medicare Other | Source: Ambulatory Visit | Attending: Cardiovascular Disease | Admitting: Cardiovascular Disease

## 2017-08-21 DIAGNOSIS — Z955 Presence of coronary angioplasty implant and graft: Secondary | ICD-10-CM

## 2017-08-21 DIAGNOSIS — I2111 ST elevation (STEMI) myocardial infarction involving right coronary artery: Secondary | ICD-10-CM

## 2017-08-21 DIAGNOSIS — I2119 ST elevation (STEMI) myocardial infarction involving other coronary artery of inferior wall: Secondary | ICD-10-CM | POA: Diagnosis not present

## 2017-08-23 ENCOUNTER — Encounter (HOSPITAL_COMMUNITY)
Admission: RE | Admit: 2017-08-23 | Discharge: 2017-08-23 | Disposition: A | Discharge status: Home / Self Care | Payer: Medicare Other | Source: Ambulatory Visit | Attending: Cardiovascular Disease | Admitting: Cardiovascular Disease

## 2017-08-23 DIAGNOSIS — I2119 ST elevation (STEMI) myocardial infarction involving other coronary artery of inferior wall: Secondary | ICD-10-CM | POA: Diagnosis not present

## 2017-08-23 DIAGNOSIS — Z955 Presence of coronary angioplasty implant and graft: Secondary | ICD-10-CM | POA: Diagnosis not present

## 2017-08-23 DIAGNOSIS — I2111 ST elevation (STEMI) myocardial infarction involving right coronary artery: Secondary | ICD-10-CM

## 2017-08-25 ENCOUNTER — Ambulatory Visit (HOSPITAL_COMMUNITY): Payer: Self-pay | Admitting: Cardiac Rehabilitation

## 2017-08-25 ENCOUNTER — Encounter (HOSPITAL_COMMUNITY): Payer: Medicare Other

## 2017-08-25 DIAGNOSIS — I2111 ST elevation (STEMI) myocardial infarction involving right coronary artery: Secondary | ICD-10-CM

## 2017-08-25 DIAGNOSIS — Z955 Presence of coronary angioplasty implant and graft: Secondary | ICD-10-CM

## 2017-08-25 NOTE — Progress Notes (Signed)
Cardiac Individual Treatment Plan  Patient Details  Name: Pacey Altizer MRN: [id number] Date of Birth: 12-12-1950 Referring Provider:     CARDIAC REHAB PHASE II ORIENTATION from 06/01/2017 in [place]  Referring Provider  Lauree Chandler MD      Initial Encounter Date:    CARDIAC REHAB PHASE II ORIENTATION from 06/01/2017 in [place]  Date  06/01/17  Referring Provider  Lauree Chandler MD      Visit Diagnosis: ST elevation myocardial infarction involving right coronary artery [facility])  Stented coronary artery  Patient's Home Medications on Admission:  Current Outpatient Medications:  .  aspirin 81 MG chewable tablet, Chew 1 tablet (81 mg total) by mouth daily., Disp: 30 tablet, Rfl:  .  atorvastatin (LIPITOR) 80 MG tablet, Take 1 tablet (80 mg total) by mouth daily at 6 PM., Disp: 90 tablet, Rfl: 1 .  losartan (COZAAR) 25 MG tablet, Take 1 tablet (25 mg total) by mouth daily., Disp: 90 tablet, Rfl: 3 .  metoprolol tartrate (LOPRESSOR) 25 MG tablet, Take 0.5 tablets (12.5 mg total) by mouth 2 (two) times daily., Disp: 60 tablet, Rfl: 2 .  nitroGLYCERIN (NITROSTAT) 0.4 MG SL tablet, Place 1 tablet (0.4 mg total) under the tongue every 5 (five) minutes as needed., Disp: 25 tablet, Rfl: 3 .  ticagrelor (BRILINTA) 90 MG TABS tablet, Take 1 tablet (90 mg total) by mouth 2 (two) times daily., Disp: 60 tablet, Rfl: 10  Past Medical History: Past Medical History:  Diagnosis Date  . Arthritis   . Ascending aorta dilatation (HCC)   . Bradycardia    a. HR 40 in setting of acute inferior STEMI 03/2017.  [place] CAD in native artery    a. inf STEMI 03/2017 a/w hypotension and bradycardia -> s/p DES to RCA, otherwise mild disease in LAD, moderate disease in ostium of the moderate caliber diagonal branch, normal LVEF >65%  . Dilated aortic root ([place])   . STEMI involving right coronary artery ([employer])    04/22/17 PCI/DES to mRCA,  normal EF  . Tobacco abuse    Quit in 1998    Tobacco Use: Social History   Tobacco Use  Smoking Status Former Smoker  Smokeless Tobacco Never Used    Labs: Recent [employer] for ITP Cardiac and Pulmonary Rehab Latest Ref Rng & Units 06/24/2011 05/27/2014 06/05/2015 04/23/2017 05/22/2017   Cholestrol 100 - 199 mg/dL 179 174 161 149 106   LDLCALC 0 - 99 mg/dL 112 104 93 90 45   HDL >39 mg/dL 54 56 59 47 53   Trlycerides 0 - 149 mg/dL 63 70 44 61 40   Hemoglobin A1c 4.0 - 6.0 % 5.4 5.1 5.4 - -      Capillary Blood Glucose: No results found for: GLUCAP   Exercise Target Goals:    Exercise Program Goal: Individual exercise prescription set with THRR, safety & activity barriers. Participant demonstrates ability to understand and report RPE using BORG scale, to self-measure pulse accurately, and to acknowledge the importance of the exercise prescription.  Exercise Prescription Goal: Starting with aerobic activity 30 plus minutes a day, 3 days per week for initial exercise prescription. Provide home exercise prescription and guidelines that participant acknowledges understanding prior to discharge.  Activity Barriers & Risk Stratification:   6 Minute Walk:   Oxygen Initial Assessment:   Oxygen Re-Evaluation:   Oxygen Discharge (Final Oxygen Re-Evaluation):   Initial Exercise Prescription:  Perform Capillary Blood Glucose checks as needed.  Exercise Prescription Changes: Exercise Prescription Changes    Row Name 06/12/17 1357 06/27/17 1300 07/17/17 1500 08/07/17 1350 08/21/17 1419     Response to Exercise   Blood Pressure (Admit)  132/80  100/60  128/80  108/70  132/78   Blood Pressure (Exercise)  150/68  138/80  150/78  160/80  178/90   Blood Pressure (Exit)  120/80  118/70  112/72  122/72  120/80   Heart Rate (Admit)  70 bpm  65 bpm  67 bpm  62 bpm  55 bpm   Heart Rate (Exercise)  101 bpm  109 bpm  119 bpm  121 bpm  120 bpm   Heart Rate (Exit)   70 bpm  68 bpm  66 bpm  70 bpm  65 bpm   Rating of Perceived Exertion (Exercise)  11  11  12  12  15    Symptoms  none  none  none  none  none   Comments  -  -  -  -  pt oriented to HIIT on 08/21/17   Duration  Continue with 30 min of aerobic exercise without signs/symptoms of physical distress.  Continue with 30 min of aerobic exercise without signs/symptoms of physical distress.  Continue with 30 min of aerobic exercise without signs/symptoms of physical distress.  Continue with 30 min of aerobic exercise without signs/symptoms of physical distress.  Continue with 30 min of aerobic exercise without signs/symptoms of physical distress.   Intensity  THRR unchanged  THRR unchanged  THRR unchanged  THRR unchanged  THRR unchanged     Progression   Progression  Continue to progress workloads to maintain intensity without signs/symptoms of physical distress.  Continue to progress workloads to maintain intensity without signs/symptoms of physical distress.  Continue to progress workloads to maintain intensity without signs/symptoms of physical distress.  Continue to progress workloads to maintain intensity without signs/symptoms of physical distress.  Continue to progress workloads to maintain intensity without signs/symptoms of physical distress.   Average METs  4.3  5.2  6  6.1  6.6     Resistance Training   Training Prescription  Yes  Yes  Yes  Yes  Yes   Weight  8lbs  9lbs  9lbs  9lbs  9lbs   Reps  10-15  10-15  10-15  10-15  10-15   Time  10 Minutes  10 Minutes  10 Minutes  10 Minutes  10 Minutes     Interval Training   Interval Training  -  -  -  -  Yes   Equipment  -  -  -  -  Treadmill;Bike;NuStep   Comments  -  -  -  -  pt does HIIT for 30 seconds and moderate intensity for 2 minutes     Treadmill   MPH  3.2  3.6  3.6  3.6  3.6   Grade  2  3  3  3  3  HIIT @ level 10 for 2 minutes; mod.@ level 3 for 8 minutes   Minutes  10  10  10  10  10    METs  4.33  5.25  5.25  5.25  5.9     Bike    Level  1.5  2.1  2.3  2.3  2.3 HIIT @ level 3.5 for 2 min;  mod @ level 2.3 for 8 min.   Minutes  10  10  10   10  10   METs  4.19  5.57  5.95  5.95  6.6     NuStep   Level  5  5  6  6  6  HIIT@ level 8 for 2 min; mod intensity @ level 6 for 8 min   SPM  90  100  100  105  105   Minutes  10  10  10  10  10    METs  4.5  5  6.7  7  7.2     Home Exercise Plan   Plans to continue exercise at  Home (comment) walking  Home (comment) walking  Home (comment) walking  Home (comment) walking  Home (comment) walking   Frequency  Add 3 additional days to program exercise sessions.  Add 3 additional days to program exercise sessions.  Add 3 additional days to program exercise sessions.  Add 3 additional days to program exercise sessions.  Add 3 additional days to program exercise sessions.   Initial Home Exercises Provided  06/12/17  06/12/17  06/12/17  06/12/17  06/12/17      Exercise Comments: Exercise Comments    Row Name 07/25/17 1438 08/22/17 1357         Exercise Comments  Reviewed METs and goals. Pt is tolerating exercise well; will continue to monitor pt's progress and activity levels.   Reviewed METs and goals. Pt is tolerating exercise well; will continue to monitor pt's progress and activity levels.          Exercise Goals and Review:   Exercise Goals Re-Evaluation : Exercise Goals Re-Evaluation    Row Name 07/25/17 1438 08/21/17 1357           Exercise Goal Re-Evaluation   Exercise Goals Review  Increase Physical Activity;Able to understand and use rate of perceived exertion (RPE) scale;Knowledge and understanding of Target Heart Rate Range (THRR);Understanding of Exercise Prescription;Increase Strength and Stamina;Able to check pulse independently  Increase Physical Activity;Able to understand and use rate of perceived exertion (RPE) scale;Knowledge and understanding of Target Heart Rate Range (THRR);Understanding of Exercise Prescription;Increase Strength and Stamina;Able to  check pulse independently      Comments  Pt states feeling great and having more energy. Pt is currently on a level 6 on nustep machine and is averaging 6.6 METs.   Pt was oriented to HIIT on 08/21/17. Pt responded well to exercise change. Pt is also down in pants size and has made significant changes to diet.      Expected Outcomes  Pt will continue to improve in cardiorespiratory fitness and functional mobility.   Pt will continue to improve in cardiorespiratory fitness and diet/nutrition to live a more heart healthy lifestyle.           Discharge Exercise Prescription (Final Exercise Prescription Changes): Exercise Prescription Changes - 08/21/17 1419      Response to Exercise   Blood Pressure (Admit)  132/78    Blood Pressure (Exercise)  178/90    Blood Pressure (Exit)  120/80    Heart Rate (Admit)  55 bpm    Heart Rate (Exercise)  120 bpm    Heart Rate (Exit)  65 bpm    Rating of Perceived Exertion (Exercise)  15    Symptoms  none    Comments  pt oriented to HIIT on 08/21/17    Duration  Continue with 30 min of aerobic exercise without signs/symptoms of physical distress.    Intensity  THRR unchanged      Progression  Progression  Continue to progress workloads to maintain intensity without signs/symptoms of physical distress.    Average METs  6.6      Resistance Training   Training Prescription  Yes    Weight  9lbs    Reps  10-15    Time  10 Minutes      Interval Training   Interval Training  Yes    Equipment  Treadmill;Bike;NuStep    Comments  pt does HIIT for 30 seconds and moderate intensity for 2 minutes      Treadmill   MPH  3.6    Grade  3 HIIT @ level 10 for 2 minutes; mod.@ level 3 for 8 minutes    Minutes  10    METs  5.9      Bike   Level  2.3 HIIT @ level 3.5 for 2 min;  mod @ level 2.3 for 8 min.    Minutes  10    METs  6.6      NuStep   Level  6 HIIT@ level 8 for 2 min; mod intensity @ level 6 for 8 min    SPM  105    Minutes  10    METs  7.2       Home Exercise Plan   Plans to continue exercise at  Home (comment) walking    Frequency  Add 3 additional days to program exercise sessions.    Initial Home Exercises Provided  06/12/17       Nutrition:  Target Goals: Understanding of nutrition guidelines, daily intake of sodium 1500mg , cholesterol 200mg , calories 30% from fat and 7% or less from saturated fats, daily to have 5 or more servings of fruits and vegetables.  Biometrics:    Nutrition Therapy Plan and Nutrition Goals:   Nutrition Discharge: Nutrition Scores:   Nutrition Goals Re-Evaluation:   Nutrition Goals Re-Evaluation:   Nutrition Goals Discharge (Final Nutrition Goals Re-Evaluation):   Psychosocial: Target Goals: Acknowledge presence or absence of significant depression and/or stress, maximize coping skills, provide positive support system. Participant is able to verbalize types and ability to use techniques and skills needed for reducing stress and depression.  Initial Review & Psychosocial Screening:   Quality of Life Scores: Quality of Life - 07/05/17 0911      Quality of Life Scores   Health/Function Pre  26 %    Socioeconomic Pre  29.17 %    Psych/Spiritual Pre  27.43 %    Family Pre  30 %    GLOBAL Pre  27.48 % QOL reviewed with pt.  health related anxiety from recent cardiac event.  pt offered emotional support and reassurance,.       PHQ-9: Recent Review Flowsheet Data    Depression screen [facility] 2/9 06/05/2017 06/23/2014   Decreased Interest 0 0   Down, Depressed, Hopeless 0 0   PHQ - 2 Score 0 0     Interpretation of Total Score  Total Score Depression Severity:  1-4 = Minimal depression, 5-9 = Mild depression, 10-14 = Moderate depression, 15-19 = Moderately severe depression, 20-27 = Severe depression   Psychosocial Evaluation and Intervention:   Psychosocial Re-Evaluation: Psychosocial Re-Evaluation    Row Name 07/04/17 1652 07/25/17 1509 08/22/17 1607          Psychosocial Re-Evaluation   Current issues with  Current Stress Concerns  Current Stress Concerns  Current Stress Concerns     Comments  pt with health related anxiety. from recent cardiac event.  pt with health related anxiety. from recent cardiac event.   pt with health related anxiety. from recent cardiac event.      Expected Outcomes  pt will demonstrate positive coping skills with good outlook   pt will demonstrate positive coping skills with good outlook   pt will demonstrate positive coping skills with good outlook      Interventions  Encouraged to attend Cardiac Rehabilitation for the exercise;Stress management education;Relaxation education  Encouraged to attend Cardiac Rehabilitation for the exercise;Stress management education;Relaxation education  Encouraged to attend Cardiac Rehabilitation for the exercise;Stress management education;Relaxation education     Continue Psychosocial Services   No Follow up required  No Follow up required  No Follow up required     Comments  Recently retired 2 months ago, health related anxiety due to the sudden onset of his cardiac event  Recently retired 2 months ago, health related anxiety due to the sudden onset of his cardiac event  -       Initial Review   Source of Stress Concerns  Chronic Illness  Chronic Illness  -        Psychosocial Discharge (Final Psychosocial Re-Evaluation): Psychosocial Re-Evaluation - 08/22/17 1607      Psychosocial Re-Evaluation   Current issues with  Current Stress Concerns    Comments  pt with health related anxiety. from recent cardiac event.     Expected Outcomes  pt will demonstrate positive coping skills with good outlook     Interventions  Encouraged to attend Cardiac Rehabilitation for the exercise;Stress management education;Relaxation education    Continue Psychosocial Services   No Follow up required       Vocational Rehabilitation: Provide vocational rehab assistance to qualifying candidates.    Vocational Rehab Evaluation & Intervention:   Education: Education Goals: Education classes will be provided on a weekly basis, covering required topics. Participant will state understanding/return demonstration of topics presented.  Learning Barriers/Preferences:   Education Topics: Count Your Pulse:  -Group instruction provided by verbal instruction, demonstration, patient participation and written materials to support subject.  Instructors address importance of being able to find your pulse and how to count your pulse when at home without a heart monitor.  Patients get hands on experience counting their pulse with staff help and individually.   Heart Attack, Angina, and Risk Factor Modification:  -Group instruction provided by verbal instruction, video, and written materials to support subject.  Instructors address signs and symptoms of angina and heart attacks.    Also discuss risk factors for heart disease and how to make changes to improve heart health risk factors.   Functional Fitness:  -Group instruction provided by verbal instruction, demonstration, patient participation, and written materials to support subject.  Instructors address safety measures for doing things around the house.  Discuss how to get up and down off the floor, how to pick things up properly, how to safely get out of a chair without assistance, and balance training.   CARDIAC REHAB PHASE II EXERCISE from 08/23/2017 in [place]  Date  06/16/17  Instruction Review Code  2- meets goals/outcomes      Meditation and Mindfulness:  -Group instruction provided by verbal instruction, patient participation, and written materials to support subject.  Instructor addresses importance of mindfulness and meditation practice to help reduce stress and improve awareness.  Instructor also leads participants through a meditation exercise.    Stretching for Flexibility and Mobility:  -Group  instruction provided by verbal  instruction, patient participation, and written materials to support subject.  Instructors lead participants through series of stretches that are designed to increase flexibility thus improving mobility.  These stretches are additional exercise for major muscle groups that are typically performed during regular warm up and cool down.   Hands Only CPR:  -Group verbal, video, and participation provides a basic overview of AHA guidelines for community CPR. Role-play of emergencies allow participants the opportunity to practice calling for help and chest compression technique with discussion of AED use.   Hypertension: -Group verbal and written instruction that provides a basic overview of hypertension including the most recent diagnostic guidelines, risk factor reduction with self-care instructions and medication management.   CARDIAC REHAB PHASE II EXERCISE from 08/23/2017 in [place]  Date  06/23/17  Instruction Review Code  2- meets goals/outcomes       Nutrition I class: Heart Healthy Eating:  -Group instruction provided by PowerPoint slides, verbal discussion, and written materials to support subject matter. The instructor gives an explanation and review of the Therapeutic Lifestyle Changes diet recommendations, which includes a discussion on lipid goals, dietary fat, sodium, fiber, plant stanol/sterol esters, sugar, and the components of a well-balanced, healthy diet.   CARDIAC REHAB PHASE II EXERCISE from 08/23/2017 in [place]  Date  06/12/17  Educator  RD  Instruction Review Code  Not applicable      Nutrition II class: Lifestyle Skills:  -Group instruction provided by PowerPoint slides, verbal discussion, and written materials to support subject matter. The instructor gives an explanation and review of label reading, grocery shopping for heart health, heart healthy recipe modifications,  and ways to make healthier choices when eating out.   Diabetes Question & Answer:  -Group instruction provided by PowerPoint slides, verbal discussion, and written materials to support subject matter. The instructor gives an explanation and review of diabetes co-morbidities, pre- and post-prandial blood glucose goals, pre-exercise blood glucose goals, signs, symptoms, and treatment of hypoglycemia and hyperglycemia, and foot care basics.   CARDIAC REHAB PHASE II EXERCISE from 08/23/2017 in [place]  Date  06/09/17  Educator  RD  Instruction Review Code  2- meets goals/outcomes      Diabetes Blitz:  -Group instruction provided by PowerPoint slides, verbal discussion, and written materials to support subject matter. The instructor gives an explanation and review of the physiology behind type 1 and type 2 diabetes, diabetes medications and rational behind using different medications, pre- and post-prandial blood glucose recommendations and Hemoglobin A1c goals, diabetes diet, and exercise including blood glucose guidelines for exercising safely.    Portion Distortion:  -Group instruction provided by PowerPoint slides, verbal discussion, written materials, and food models to support subject matter. The instructor gives an explanation of serving size versus portion size, changes in portions sizes over the last 20 years, and what consists of a serving from each food group.   Stress Management:  -Group instruction provided by verbal instruction, video, and written materials to support subject matter.  Instructors review role of stress in heart disease and how to cope with stress positively.     Exercising on Your Own:  -Group instruction provided by verbal instruction, power point, and written materials to support subject.  Instructors discuss benefits of exercise, components of exercise, frequency and intensity of exercise, and end points for exercise.  Also  discuss use of nitroglycerin and activating EMS.  Review options of places to exercise  outside of rehab.  Review guidelines for sex with heart disease.   Cardiac Drugs I:  -Group instruction provided by verbal instruction and written materials to support subject.  Instructor reviews cardiac drug classes: antiplatelets, anticoagulants, beta blockers, and statins.  Instructor discusses reasons, side effects, and lifestyle considerations for each drug class.   CARDIAC REHAB PHASE II EXERCISE from 08/23/2017 in [place]  Date  06/07/17  Instruction Review Code  2- meets goals/outcomes      Cardiac Drugs II:  -Group instruction provided by verbal instruction and written materials to support subject.  Instructor reviews cardiac drug classes: angiotensin converting enzyme inhibitors (ACE-I), angiotensin II receptor blockers (ARBs), nitrates, and calcium channel blockers.  Instructor discusses reasons, side effects, and lifestyle considerations for each drug class.   CARDIAC REHAB PHASE II EXERCISE from 08/23/2017 in [place]  Date  07/05/17  Instruction Review Code  2- meets goals/outcomes      Anatomy and Physiology of the Circulatory System:  Group verbal and written instruction and models provide basic cardiac anatomy and physiology, with the coronary electrical and arterial systems. Review of: AMI, Angina, Valve disease, Heart Failure, Peripheral Artery Disease, Cardiac Arrhythmia, Pacemakers, and the ICD.   CARDIAC REHAB PHASE II EXERCISE from 08/23/2017 in [place]  Date  08/23/17  Instruction Review Code  2- meets goals/outcomes      Other Education:  -Group or individual verbal, written, or video instructions that support the educational goals of the cardiac rehab program.   Knowledge Questionnaire Score:   Core Components/Risk Factors/Patient Goals at Admission:   Core  Components/Risk Factors/Patient Goals Review:  Goals and Risk Factor Review    Row Name 07/04/17 1652 07/25/17 1509 08/22/17 1607         Core Components/Risk Factors/Patient Goals Review   Personal Goals Review  Hypertension;Lipids;Stress  Hypertension;Lipids;Stress  Hypertension;Lipids;Stress     Review  pt with CAD RF demonstrates eagerness to participate in CR exercise, nutrition and education. pt BP WNL at rehab.   pt with CAD RF demonstrates eagerness to participate in CR exercise, nutrition and education. pt BP WNL at rehab.   pt with CAD RF demonstrates eagerness to participate in CR exercise, nutrition and education. pt BP WNL at rehab.      Expected Outcomes  pt will participate in CR exercise, nutrition and lifestyle modification to decrease overall CAD RF.   pt will participate in CR exercise, nutrition and lifestyle modification to decrease overall CAD RF.   pt will participate in CR exercise, nutrition and lifestyle modification to decrease overall CAD RF.         Core Components/Risk Factors/Patient Goals at Discharge (Final Review):  Goals and Risk Factor Review - 08/22/17 1607      Core Components/Risk Factors/Patient Goals Review   Personal Goals Review  Hypertension;Lipids;Stress    Review  pt with CAD RF demonstrates eagerness to participate in CR exercise, nutrition and education. pt BP WNL at rehab.     Expected Outcomes  pt will participate in CR exercise, nutrition and lifestyle modification to decrease overall CAD RF.        ITP Comments: ITP Comments    Row Name 07/04/17 1651 07/25/17 1509 08/22/17 1607       ITP Comments  30day ITP review.  pt with good attendance and participation. pt recent absences due to scheduled travel  30day ITP review.  pt with good attendance  and participation.   30day ITP review.  pt with good attendance and participation.         Comments:

## 2017-08-28 ENCOUNTER — Encounter (HOSPITAL_COMMUNITY): Payer: Medicare Other

## 2017-08-30 ENCOUNTER — Encounter (HOSPITAL_COMMUNITY)
Admission: RE | Admit: 2017-08-30 | Discharge: 2017-08-30 | Disposition: A | Discharge status: Home / Self Care | Payer: Medicare Other | Source: Ambulatory Visit | Attending: Cardiovascular Disease | Admitting: Cardiovascular Disease

## 2017-08-30 VITALS — Ht 70.0 in | Wt 190.9 lb

## 2017-08-30 DIAGNOSIS — I2119 ST elevation (STEMI) myocardial infarction involving other coronary artery of inferior wall: Secondary | ICD-10-CM | POA: Insufficient documentation

## 2017-08-30 DIAGNOSIS — I213 ST elevation (STEMI) myocardial infarction of unspecified site: Secondary | ICD-10-CM | POA: Diagnosis present

## 2017-08-30 DIAGNOSIS — Z955 Presence of coronary angioplasty implant and graft: Secondary | ICD-10-CM | POA: Diagnosis not present

## 2017-08-30 DIAGNOSIS — I2111 ST elevation (STEMI) myocardial infarction involving right coronary artery: Secondary | ICD-10-CM

## 2017-09-01 ENCOUNTER — Encounter (HOSPITAL_COMMUNITY)
Admission: RE | Admit: 2017-09-01 | Discharge: 2017-09-01 | Disposition: A | Discharge status: Home / Self Care | Payer: Medicare Other | Source: Ambulatory Visit | Attending: Cardiovascular Disease | Admitting: Cardiovascular Disease

## 2017-09-01 DIAGNOSIS — Z955 Presence of coronary angioplasty implant and graft: Secondary | ICD-10-CM

## 2017-09-01 DIAGNOSIS — I2111 ST elevation (STEMI) myocardial infarction involving right coronary artery: Secondary | ICD-10-CM

## 2017-09-01 DIAGNOSIS — I2119 ST elevation (STEMI) myocardial infarction involving other coronary artery of inferior wall: Secondary | ICD-10-CM | POA: Diagnosis not present

## 2017-09-04 ENCOUNTER — Encounter (HOSPITAL_COMMUNITY): Payer: Medicare Other

## 2017-09-06 ENCOUNTER — Telehealth (HOSPITAL_COMMUNITY): Payer: Self-pay

## 2017-09-06 ENCOUNTER — Encounter (HOSPITAL_COMMUNITY)
Admission: RE | Admit: 2017-09-06 | Discharge: 2017-09-06 | Disposition: A | Discharge status: Home / Self Care | Payer: Medicare Other | Source: Ambulatory Visit | Attending: Cardiovascular Disease | Admitting: Cardiovascular Disease

## 2017-09-06 DIAGNOSIS — I2111 ST elevation (STEMI) myocardial infarction involving right coronary artery: Secondary | ICD-10-CM

## 2017-09-06 DIAGNOSIS — I2119 ST elevation (STEMI) myocardial infarction involving other coronary artery of inferior wall: Secondary | ICD-10-CM | POA: Diagnosis not present

## 2017-09-06 DIAGNOSIS — Z955 Presence of coronary angioplasty implant and graft: Secondary | ICD-10-CM | POA: Diagnosis not present

## 2017-09-06 NOTE — Telephone Encounter (Signed)
-----   Message from [name], MD sent at 08/01/2017  1:17 PM EST ----- Regarding: RE: THR increase and request to start HIIT I think he is ok for HIIT. Thanks, Gerald Stabs  ----- Message ----- From: Dorna Bloom D Sent: 08/01/2017  12:12 PM To: [name], MD Subject: THR increase and request to start HIIT         Your patient Gregory Hale is interested in doing high intensity interval training (HIIT) in Cardiac Rehab.  They have been in program for 2 months and have been doing great.  We would like to change their exercise prescription to include HIIT.  Their current THR is 62-124  (40-80%) and we would like to increase it to 147 max (95 %) for HIIT.  Their RPE levels for the HIIT would reach up to 16-17 and active rest would be 11-13.  They would start at 2 min of active rest and 30 sec of high intensity and progress as tolerated. If you are agreeable to this change in exercise prescription please let us know.   Thanks so much for your help!  [employer], ACSM RCEP

## 2017-09-08 ENCOUNTER — Encounter (HOSPITAL_COMMUNITY)
Admission: RE | Admit: 2017-09-08 | Discharge: 2017-09-08 | Disposition: A | Discharge status: Home / Self Care | Payer: Medicare Other | Source: Ambulatory Visit | Attending: Cardiovascular Disease | Admitting: Cardiovascular Disease

## 2017-09-08 DIAGNOSIS — Z955 Presence of coronary angioplasty implant and graft: Secondary | ICD-10-CM | POA: Diagnosis not present

## 2017-09-08 DIAGNOSIS — I2111 ST elevation (STEMI) myocardial infarction involving right coronary artery: Secondary | ICD-10-CM

## 2017-09-08 DIAGNOSIS — I2119 ST elevation (STEMI) myocardial infarction involving other coronary artery of inferior wall: Secondary | ICD-10-CM | POA: Diagnosis not present

## 2017-09-09 ENCOUNTER — Other Ambulatory Visit: Payer: Self-pay | Admitting: Cardiology

## 2017-09-11 ENCOUNTER — Encounter (HOSPITAL_COMMUNITY)
Admission: RE | Admit: 2017-09-11 | Discharge: 2017-09-11 | Disposition: A | Discharge status: Home / Self Care | Payer: Medicare Other | Source: Ambulatory Visit | Attending: Cardiovascular Disease | Admitting: Cardiovascular Disease

## 2017-09-11 DIAGNOSIS — Z955 Presence of coronary angioplasty implant and graft: Secondary | ICD-10-CM | POA: Diagnosis not present

## 2017-09-11 DIAGNOSIS — I2119 ST elevation (STEMI) myocardial infarction involving other coronary artery of inferior wall: Secondary | ICD-10-CM | POA: Diagnosis not present

## 2017-09-11 DIAGNOSIS — I2111 ST elevation (STEMI) myocardial infarction involving right coronary artery: Secondary | ICD-10-CM

## 2017-09-12 ENCOUNTER — Encounter (HOSPITAL_COMMUNITY): Payer: Self-pay

## 2017-09-13 ENCOUNTER — Encounter (HOSPITAL_COMMUNITY)
Admission: RE | Admit: 2017-09-13 | Discharge: 2017-09-13 | Disposition: A | Discharge status: Home / Self Care | Payer: Medicare Other | Source: Ambulatory Visit | Attending: Cardiovascular Disease | Admitting: Cardiovascular Disease

## 2017-09-13 DIAGNOSIS — Z955 Presence of coronary angioplasty implant and graft: Secondary | ICD-10-CM | POA: Diagnosis not present

## 2017-09-13 DIAGNOSIS — I2111 ST elevation (STEMI) myocardial infarction involving right coronary artery: Secondary | ICD-10-CM

## 2017-09-13 DIAGNOSIS — I2119 ST elevation (STEMI) myocardial infarction involving other coronary artery of inferior wall: Secondary | ICD-10-CM | POA: Diagnosis not present

## 2017-09-15 ENCOUNTER — Encounter (HOSPITAL_COMMUNITY)
Admission: RE | Admit: 2017-09-15 | Discharge: 2017-09-15 | Disposition: A | Discharge status: Home / Self Care | Payer: Medicare Other | Source: Ambulatory Visit | Attending: Cardiovascular Disease | Admitting: Cardiovascular Disease

## 2017-09-15 DIAGNOSIS — I2119 ST elevation (STEMI) myocardial infarction involving other coronary artery of inferior wall: Secondary | ICD-10-CM | POA: Diagnosis not present

## 2017-09-15 DIAGNOSIS — I2111 ST elevation (STEMI) myocardial infarction involving right coronary artery: Secondary | ICD-10-CM

## 2017-09-15 DIAGNOSIS — Z955 Presence of coronary angioplasty implant and graft: Secondary | ICD-10-CM | POA: Diagnosis not present

## 2017-09-27 NOTE — Progress Notes (Signed)
Discharge Progress Report  Patient Details  Name: Gregory Hale MRN: [id number] Date of Birth: 24-Sep-1951 Referring Provider:     CARDIAC REHAB PHASE II ORIENTATION from 06/01/2017 in [place]  Referring Provider  Lauree Chandler MD       Number of Visits: 35   Reason for Discharge:  Patient has met program and personal goals.  Smoking History:  Social History   Tobacco Use  Smoking Status Former Smoker  Smokeless Tobacco Never Used    Diagnosis:  ST elevation myocardial infarction involving right coronary artery (HCC)  Stented coronary artery  ADL UCSD:   Initial Exercise Prescription: Initial Exercise Prescription - 06/01/17 1100      Date of Initial Exercise RX and Referring Provider   Date  06/01/17    Referring Provider  Lauree Chandler MD      Treadmill   MPH  3.2    Grade  1    Minutes  10    METs  3.89      Bike   Level  1    Minutes  10    METs  3.1      NuStep   Level  4    SPM  90    Minutes  10    METs  3      Prescription Details   Frequency (times per week)  3    Duration  Progress to 30 minutes of continuous aerobic without signs/symptoms of physical distress      Intensity   THRR 40-80% of Max Heartrate  62-124    Ratings of Perceived Exertion  11-13    Perceived Dyspnea  0-4      Progression   Progression  Continue to progress workloads to maintain intensity without signs/symptoms of physical distress.      Resistance Training   Training Prescription  Yes    Weight  5lbs    Reps  10-15       Discharge Exercise Prescription (Final Exercise Prescription Changes): Exercise Prescription Changes - 09/15/17 1000      Response to Exercise   Blood Pressure (Admit)  130/72    Blood Pressure (Exercise)  146/80    Blood Pressure (Exit)  110/72    Heart Rate (Admit)  69 bpm    Heart Rate (Exercise)  105 bpm    Heart Rate (Exit)  70 bpm    Rating of Perceived Exertion (Exercise)  14     Symptoms  none    Duration  Continue with 30 min of aerobic exercise without signs/symptoms of physical distress.    Intensity  THRR unchanged      Progression   Progression  Continue to progress workloads to maintain intensity without signs/symptoms of physical distress.    Average METs  6.2      Resistance Training   Training Prescription  Yes    Weight  9lbs    Reps  10-15    Time  10 Minutes      Interval Training   Interval Training  Yes    Equipment  Treadmill;Bike;NuStep    Comments  pt does HIIT for 30 seconds and moderate intensity for 2 minutes      Treadmill   MPH  3.8    Grade  3 HITT at level 10 for 2 minutes, Mod at level 3 for 8 minutes    Minutes  30    METs  6.2      Home  Exercise Plan   Plans to continue exercise at  Home (comment) Walking, [employer], Golfing     Frequency  Add 3 additional days to program exercise sessions.    Initial Home Exercises Provided  06/12/17       Functional Capacity: 6 Minute Walk    Row Name 06/01/17 1118 08/30/17 1024       6 Minute Walk   Phase  Initial  Discharge    Distance  1978 feet  2254 feet    Distance % Change  -  13.95 %    Distance Feet Change  -  276 ft    Walk Time  6 minutes  6 minutes    # of Rest Breaks  0  0    MPH  3.7  4.3    METS  4.18  5    RPE  9  12    VO2 Peak  14.66  -    Symptoms  No  No    Resting HR  59 bpm  63 bpm    Resting BP  110/62  118/62    Resting Oxygen Saturation   100 %  -    Exercise Oxygen Saturation  during 6 min walk  100 %  -    Max Ex. HR  78 bpm  91 bpm    Max Ex. BP  130/80  160/60    2 Minute Post BP  112/70  120/76       Psychological, QOL, Others - Outcomes: PHQ 2/9: Depression screen [facility] 2/9 09/20/2017 06/05/2017 06/23/2014  Decreased Interest 0 0 0  Down, Depressed, Hopeless 0 0 0  PHQ - 2 Score 0 0 0    Quality of Life: Quality of Life - 09/11/17 1039      Quality of Life Scores   Health/Function Pre  26 %    Health/Function Post   22.8 %    Health/Function % Change  -12.31 %    Socioeconomic Pre  29.17 %    Socioeconomic Post  26.43 %    Socioeconomic % Change   -9.39 %    Psych/Spiritual Pre  27.43 %    Psych/Spiritual Post  27.86 %    Psych/Spiritual % Change  1.57 %    Family Pre  30 %    Family Post  27.6 %    Family % Change  -8 %    GLOBAL Pre  27.48 %    GLOBAL Post  25.29 %    GLOBAL % Change  -7.97 %       Personal Goals: Goals established at orientation with interventions provided to work toward goal. Personal Goals and Risk Factors at Admission - 06/01/17 1124      Core Components/Risk Factors/Patient Goals on Admission   Hypertension  Yes    Intervention  Provide education on lifestyle modifcations including regular physical activity/exercise, weight management, moderate sodium restriction and increased consumption of fresh fruit, vegetables, and low fat dairy, alcohol moderation, and smoking cessation.;Monitor prescription use compliance.    Expected Outcomes  Short Term: Continued assessment and intervention until BP is < 140/51m HG in hypertensive participants. < 130/874mHG in hypertensive participants with diabetes, heart failure or chronic kidney disease.;Long Term: Maintenance of blood pressure at goal levels.    Lipids  Yes    Intervention  Provide education and support for participant on nutrition & aerobic/resistive exercise along with prescribed medications to achieve LDL <7091mHDL >76m31m  Expected Outcomes  Long Term: Cholesterol controlled with medications as prescribed, with individualized exercise RX and with personalized nutrition plan. Value goals: LDL < 40m, HDL > 40 mg.;Short Term: Participant states understanding of desired cholesterol values and is compliant with medications prescribed. Participant is following exercise prescription and nutrition guidelines.    Stress  Yes    Intervention  Offer individual and/or small group education and counseling on adjustment to heart  disease, stress management and health-related lifestyle change. Teach and support self-help strategies.;Refer participants experiencing significant psychosocial distress to appropriate mental health specialists for further evaluation and treatment. When possible, include family members and significant others in education/counseling sessions.    Expected Outcomes  Short Term: Participant demonstrates changes in health-related behavior, relaxation and other stress management skills, ability to obtain effective social support, and compliance with psychotropic medications if prescribed.;Long Term: Emotional wellbeing is indicated by absence of clinically significant psychosocial distress or social isolation.        Personal Goals Discharge: Goals and Risk Factor Review    Row Name 06/07/17 1457 07/04/17 1652 07/25/17 1509 08/22/17 1607 09/12/17 1520     Core Components/Risk Factors/Patient Goals Review   Personal Goals Review  Hypertension;Lipids;Stress  Hypertension;Lipids;Stress  Hypertension;Lipids;Stress  Hypertension;Lipids;Stress  Hypertension;Lipids;Stress   Review  pt with CAD RF demonstrates eagerness to participate in CR exercise, nutrition and education opportunities.   pt with CAD RF demonstrates eagerness to participate in CR exercise, nutrition and education. pt BP WNL at rehab.   pt with CAD RF demonstrates eagerness to participate in CR exercise, nutrition and education. pt BP WNL at rehab.   pt with CAD RF demonstrates eagerness to participate in CR exercise, nutrition and education. pt BP WNL at rehab.   pt with CAD RF demonstrates eagerness to participate in CR exercise, nutrition and education. pt BP WNL at rehab.    Expected Outcomes  pt will participate in CR exercise, nutrition and lifestyle modification to decrease overall CAD RF.   pt will participate in CR exercise, nutrition and lifestyle modification to decrease overall CAD RF.   pt will participate in CR exercise, nutrition and  lifestyle modification to decrease overall CAD RF.   pt will participate in CR exercise, nutrition and lifestyle modification to decrease overall CAD RF.   pt will participate in CR exercise, nutrition and lifestyle modification to decrease overall CAD RF.    [place] 09/15/17 0911             Core Components/Risk Factors/Patient Goals Review   Personal Goals Review  Hypertension;Lipids;Stress       Review  pt with CAD RF demonstrated eagerness to participate. pt plans to continue exercising on his own at local gym. pt given exercise guidelines instructions including weather precautions and utilizing RPE scale for maintaining appropriate exertion level.         Expected Outcomes  pt will continue exercise, nutrition and lifestyle modifications to decrease overall CAD RF.           Exercise Goals and Review: Exercise Goals    Row Name 06/01/17 1123 06/01/17 1124           Exercise Goals   Increase Physical Activity  Yes  -      Intervention  Provide advice, education, support and counseling about physical activity/exercise needs.;Develop an individualized exercise prescription for aerobic and resistive training based on initial evaluation findings, risk stratification, comorbidities and participant's personal goals.  -      Expected Outcomes  Achievement of increased cardiorespiratory fitness and enhanced flexibility, muscular endurance and strength shown through measurements of functional capacity and personal statement of participant.  -      Increase Strength and Stamina  Yes  - return to golf      Intervention  Provide advice, education, support and counseling about physical activity/exercise needs.;Develop an individualized exercise prescription for aerobic and resistive training based on initial evaluation findings, risk stratification, comorbidities and participant's personal goals.  -      Expected Outcomes  Achievement of increased cardiorespiratory fitness and enhanced  flexibility, muscular endurance and strength shown through measurements of functional capacity and personal statement of participant.  -      Able to understand and use rate of perceived exertion (RPE) scale  Yes  -      Intervention  Provide education and explanation on how to use RPE scale  -      Expected Outcomes  Short Term: Able to use RPE daily in rehab to express subjective intensity level;Long Term:  Able to use RPE to guide intensity level when exercising independently  -      Knowledge and understanding of Target Heart Rate Range (THRR)  Yes  -      Intervention  Provide education and explanation of THRR including how the numbers were predicted and where they are located for reference  -      Expected Outcomes  Short Term: Able to state/look up THRR;Short Term: Able to use daily as guideline for intensity in rehab;Long Term: Able to use THRR to govern intensity when exercising independently  -      Able to check pulse independently  Yes  -      Intervention  Provide education and demonstration on how to check pulse in carotid and radial arteries.;Review the importance of being able to check your own pulse for safety during independent exercise  -      Expected Outcomes  Short Term: Able to explain why pulse checking is important during independent exercise;Long Term: Able to check pulse independently and accurately  -      Understanding of Exercise Prescription  Yes  -      Intervention  Provide education, explanation, and written materials on patient's individual exercise prescription  -      Expected Outcomes  Short Term: Able to explain program exercise prescription;Long Term: Able to explain home exercise prescription to exercise independently  -         Nutrition & Weight - Outcomes: Pre Biometrics - 06/01/17 1123      Pre Biometrics   Waist Circumference  37 inches    Hip Circumference  41 inches    Waist to Hip Ratio  0.9 %    Triceps Skinfold  15 mm    % Body Fat  25.7 %     Grip Strength  48 kg    Flexibility  16 in    Single Leg Stand  20 seconds      Post Biometrics - 08/30/17 1026       Post  Biometrics   Height  '5\' 10"'  (1.778 m)    Weight  190 lb 14.7 oz (86.6 kg)    Waist Circumference  36 inches    Hip Circumference  41 inches    Waist to Hip Ratio  0.88 %    BMI (Calculated)  27.39    Triceps Skinfold  11 mm    % Body Fat  24 %  Grip Strength  50 kg    Flexibility  16.75 in    Single Leg Stand  30 seconds       Nutrition: Nutrition Therapy & Goals - 06/01/17 1041      Nutrition Therapy   Diet  Therapeutic Lifestyle Changes      Personal Nutrition Goals   Nutrition Goal  Pt to identify and limit food sources of saturated fat, trans fat, and sodium    Personal Goal #2  Pt to identify food quantities necessary to achieve weight loss of 6-15 lb at graduation from cardiac rehab. Goal wt of 175 lb desired.       Intervention Plan   Intervention  Prescribe, educate and counsel regarding individualized specific dietary modifications aiming towards targeted core components such as weight, hypertension, lipid management, diabetes, heart failure and other comorbidities.    Expected Outcomes  Short Term Goal: Understand basic principles of dietary content, such as calories, fat, sodium, cholesterol and nutrients.;Long Term Goal: Adherence to prescribed nutrition plan.       Nutrition Discharge: Nutrition Assessments - 09/25/17 0958      MEDFICTS Scores   Pre Score  52    Post Score  30    Score Difference  -22       Education Questionnaire Score: Knowledge Questionnaire Score - 09/22/17 0936      Knowledge Questionnaire Score   Pre Score  19/24    Post Score  23/24       Goals reviewed with patient; copy given to patient.

## 2018-01-25 NOTE — Progress Notes (Signed)
Chief Complaint  Patient presents with  . Follow-up    CAD   History of Present Illness: 67 yo male with history of CAD, former tobacco abuse, OA and thoracic aortic aneurysm here today for cardiac follow up. He was admitted to [facility] 04/22/17 with an acute inferior STEMI. His RCA was occluded and was treated with a drug eluting stent. There was mild disease noted in the LAD and moderate disease in the Diagonal branch. LVEF=65%. Echo 04/23/17 with normal LV systolic function, VQMG=86-76%, no valve disease, mild dilation of aortic root. He did well following his MI. Chest CTA November 2018 with 4.3 cm ascending aortic aneurysm.  He is here today for follow up. The patient denies any chest pain, dyspnea, palpitations, lower extremity edema, orthopnea, PND, dizziness, near syncope or syncope.   Primary Care Physician: Marletta Lor, MD  Past Medical History:  Diagnosis Date  . Arthritis   . Ascending aorta dilatation (HCC)   . Bradycardia    a. HR 40 in setting of acute inferior STEMI 03/2017.  Marland Kitchen CAD in native artery    a. inf STEMI 03/2017 a/w hypotension and bradycardia -> s/p DES to RCA, otherwise mild disease in LAD, moderate disease in ostium of the moderate caliber diagonal branch, normal LVEF >65%  . Dilated aortic root ([place])   . STEMI involving right coronary artery ([place])    04/22/17 PCI/DES to mRCA, normal EF  . Tobacco abuse    Quit in 1998    Past Surgical History:  Procedure Laterality Date  . CORONARY STENT INTERVENTION N/A 04/22/2017   Procedure: Coronary Stent Intervention;  Surgeon: [name], MD;  Location: [facility];  Service: Cardiovascular;  Laterality: N/A;  . LEFT HEART CATH AND CORONARY ANGIOGRAPHY N/A 04/22/2017   Procedure: Left Heart Cath and Coronary Angiography;  Surgeon: [name], MD;  Location: [facility];  Service: Cardiovascular;  Laterality: N/A;  . parotid Left 1980  . TONSILLECTOMY     as a child     Current Outpatient Medications  Medication Sig Dispense Refill  . aspirin 81 MG chewable tablet Chew 1 tablet (81 mg total) by mouth daily. 30 tablet   . atorvastatin (LIPITOR) 80 MG tablet TAKE 1 TABLET (80 MG TOTAL) BY MOUTH DAILY AT 6 PM. 90 tablet 3  . metoprolol tartrate (LOPRESSOR) 25 MG tablet TAKE 0.5 TABLETS (12.5 MG TOTAL) BY MOUTH 2 (TWO) TIMES DAILY. 90 tablet 3  . nitroGLYCERIN (NITROSTAT) 0.4 MG SL tablet Place 1 tablet (0.4 mg total) under the tongue every 5 (five) minutes as needed. 25 tablet 3  . ticagrelor (BRILINTA) 90 MG TABS tablet Take 1 tablet (90 mg total) by mouth 2 (two) times daily. 60 tablet 10  . losartan (COZAAR) 25 MG tablet Take 1 tablet (25 mg total) by mouth daily. 90 tablet 3   No current facility-administered medications for this visit.     No Known Allergies  Social History   Socioeconomic History  . Marital status: Married    Spouse name: Not on file  . Number of children: Not on file  . Years of education: Not on file  . Highest education level: Not on file  Occupational History  . Not on file  Social Needs  . Financial resource strain: Not on file  . Food insecurity:    Worry: Not on file    Inability: Not on file  . Transportation needs:    Medical: Not on file  Non-medical: Not on file  Tobacco Use  . Smoking status: Former Research scientist (life sciences)  . Smokeless tobacco: Never Used  Substance and Sexual Activity  . Alcohol use: Yes    Alcohol/week: 1.2 - 1.8 oz    Types: 2 - 3 Cans of beer per week  . Drug use: No  . Sexual activity: Not on file  Lifestyle  . Physical activity:    Days per week: Not on file    Minutes per session: Not on file  . Stress: Not on file  Relationships  . Social connections:    Talks on phone: Not on file    Gets together: Not on file    Attends religious service: Not on file    Active member of club or organization: Not on file    Attends meetings of clubs or organizations: Not on file    Relationship  status: Not on file  . Intimate partner violence:    Fear of current or ex partner: Not on file    Emotionally abused: Not on file    Physically abused: Not on file    Forced sexual activity: Not on file  Other Topics Concern  . Not on file  Social History Narrative  . Not on file    Family History  Problem Relation Age of Onset  . CAD Brother     Review of Systems:  As stated in the HPI and otherwise negative.   BP 122/76   Pulse (!) 53   Ht 5\' 10"  (1.778 m)   Wt 191 lb (86.6 kg)   SpO2 98%   BMI 27.41 kg/m   Physical Examination:  General: Well developed, well nourished, NAD  HEENT: OP clear, mucus membranes moist  SKIN: warm, dry. No rashes. Neuro: No focal deficits  Musculoskeletal: Muscle strength 5/5 all ext  Psychiatric: Mood and affect normal  Neck: No JVD, no carotid bruits, no thyromegaly, no lymphadenopathy.  Lungs:Clear bilaterally, no wheezes, rhonci, crackles Cardiovascular: Regular rate and rhythm. No murmurs, gallops or rubs. Abdomen:Soft. Bowel sounds present. Non-tender.  Extremities: No lower extremity edema. Pulses are 2 + in the bilateral DP/PT.  Echo 04/23/17: - Left ventricle: The cavity size was normal. Wall thickness was   increased in a pattern of mild LVH. Systolic function was normal.   The estimated ejection fraction was in the range of 60% to 65%.   Wall motion was normal; there were no regional wall motion   abnormalities. Doppler parameters are consistent with abnormal   left ventricular relaxation (grade 1 diastolic dysfunction). - Aortic root: The aortic root was mildly dilated. - Ascending aorta: The ascending aorta was mildly dilated. - Mitral valve: There was trivial regurgitation. - Right atrium: Central venous pressure (est): 3 mm Hg. - Atrial septum: No defect or patent foramen ovale was identified. - Tricuspid valve: There was mild regurgitation. - Pulmonary arteries: PA peak pressure: 24 mm Hg (S). - Pericardium,  extracardiac: There was no pericardial effusion.  Impressions:  - Mild LVH with LVEF 60-65% and grade 1 diastolic dysfunction.   Trivial mitral regurgitation. Mildly dilated aortic root and   ascending aorta. Mild tricuspid regurgitation with PASP 24 mmHg.  Cardiac cath 04/22/17:  Mid LAD lesion, 30 %stenosed.  Ost 2nd Diag lesion, 70 %stenosed.  A STENT RESOLUTE ONYX 5.0X18 drug eluting stent was successfully placed.  Prox RCA lesion, 100 %stenosed.  Post intervention, there is a 0% residual stenosis.  The left ventricular systolic function is normal.  LV end  diastolic pressure is normal.  The left ventricular ejection fraction is greater than 65% by visual estimate.  There is no mitral valve regurgitation.  Diagnostic Diagram       Post-Intervention Diagram          EKG:  EKG is ordered today. The ekg ordered today demonstrates Sinus brady, rate 53 bpm.  Recent Labs: 04/24/2017: Hemoglobin 14.8; Platelets 145 05/22/2017: ALT 18 08/04/2017: BUN 15; Creatinine, Ser 0.84; Potassium 4.5; Sodium 143   Lipid Panel    Component Value Date/Time   CHOL 106 05/22/2017 0814   CHOL 179 06/24/2011   TRIG 40 05/22/2017 0814   TRIG 63 06/24/2011   HDL 53 05/22/2017 0814   CHOLHDL 2.0 05/22/2017 0814   CHOLHDL 3.2 04/23/2017 0310   VLDL 12 04/23/2017 0310   LDLCALC 45 05/22/2017 0814     Wt Readings from Last 3 Encounters:  01/26/18 191 lb (86.6 kg)  08/30/17 190 lb 14.7 oz (86.6 kg)  08/04/17 193 lb 9.6 oz (87.8 kg)     Other studies Reviewed: Additional studies/ records that were reviewed today include: . Review of the above records demonstrates:    Assessment and Plan:   1. CAD without angina: He has no chest pain. Will continue ASA and Brilinta, beta blocker and statin. I will stop Brilinta in July and will then convert to ASA and Plavix. He will call back when he runs out of Brilinta.   2. Thoracic aortic aneurysm: 4.3 cm aortic root aneurysm by CTA  November 2018. Will arrange Chest MRA November 2019 to follow.   Current medicines are reviewed at length with the patient today.  The patient does not have concerns regarding medicines.  The following changes have been made:  no change  Labs/ tests ordered today include:   Orders Placed This Encounter  Procedures  . MR Angiogram Chest W Wo Contrast  . Basic metabolic panel  . EKG 12-Lead     Disposition:   FU with me in 12 months   Signed, Lauree Chandler, MD 01/26/2018 8:35 AM    [facility] [address], [place], [place]  [address] Phone: [phone]; Fax: [phone]

## 2018-01-26 ENCOUNTER — Encounter: Payer: Self-pay | Admitting: Cardiovascular Disease

## 2018-01-26 ENCOUNTER — Ambulatory Visit (INDEPENDENT_AMBULATORY_CARE_PROVIDER_SITE_OTHER): Payer: Medicare Other | Admitting: Cardiovascular Disease

## 2018-01-26 VITALS — BP 122/76 | HR 53 | Ht 70.0 in | Wt 191.0 lb

## 2018-01-26 DIAGNOSIS — I2510 Atherosclerotic heart disease of native coronary artery without angina pectoris: Secondary | ICD-10-CM

## 2018-01-26 DIAGNOSIS — I77810 Thoracic aortic ectasia: Secondary | ICD-10-CM | POA: Diagnosis not present

## 2018-01-26 DIAGNOSIS — I712 Thoracic aortic aneurysm, without rupture, unspecified: Secondary | ICD-10-CM

## 2018-01-26 NOTE — Patient Instructions (Signed)
Medication Instructions:  Your provider recommends that you continue on your current medications as directed. Please refer to the Current Medication list given to you today.     Labwork: You will have labs a week prior to your scan in November.  Testing/Procedures: You will have an MRA of your chest in November.  Follow-Up: Your provider wants you to follow-up in: 1 year with Dr. Angelena Form. You will receive a reminder letter in the mail two months in advance. If you don't receive a letter, please call our office to schedule the follow-up appointment.    Any Other Special Instructions Will Be Listed Below (If Applicable).     If you need a refill on your cardiac medications before your next appointment, please call your pharmacy.

## 2018-01-29 ENCOUNTER — Ambulatory Visit (INDEPENDENT_AMBULATORY_CARE_PROVIDER_SITE_OTHER): Payer: Medicare Other | Admitting: Internal Medicine

## 2018-01-29 ENCOUNTER — Ambulatory Visit: Payer: Medicare Other | Admitting: Cardiovascular Disease

## 2018-01-29 ENCOUNTER — Encounter: Payer: Self-pay | Admitting: Internal Medicine

## 2018-01-29 VITALS — BP 100/60 | HR 66 | Temp 98.5°F | Ht 70.0 in | Wt 191.0 lb

## 2018-01-29 DIAGNOSIS — I10 Essential (primary) hypertension: Secondary | ICD-10-CM | POA: Diagnosis not present

## 2018-01-29 DIAGNOSIS — Z23 Encounter for immunization: Secondary | ICD-10-CM | POA: Diagnosis not present

## 2018-01-29 DIAGNOSIS — Z0000 Encounter for general adult medical examination without abnormal findings: Secondary | ICD-10-CM | POA: Diagnosis not present

## 2018-01-29 DIAGNOSIS — E039 Hypothyroidism, unspecified: Secondary | ICD-10-CM

## 2018-01-29 DIAGNOSIS — I2510 Atherosclerotic heart disease of native coronary artery without angina pectoris: Secondary | ICD-10-CM | POA: Diagnosis not present

## 2018-01-29 NOTE — Patient Instructions (Addendum)
Limit your sodium (Salt) intake    It is important that you exercise regularly, at least 20 minutes 3 to 4 times per week.  If you develop chest pain or shortness of breath seek  medical attention.  Cardiology follow-up as scheduled   Health Maintenance, Male A healthy lifestyle and preventive care is important for your health and wellness. Ask your health care provider about what schedule of regular examinations is right for you. What should I know about weight and diet? Eat a Healthy Diet  Eat plenty of vegetables, fruits, whole grains, low-fat dairy products, and lean protein.  Do not eat a lot of foods high in solid fats, added sugars, or salt.  Maintain a Healthy Weight Regular exercise can help you achieve or maintain a healthy weight. You should:  Do at least 150 minutes of exercise each week. The exercise should increase your heart rate and make you sweat (moderate-intensity exercise).  Do strength-training exercises at least twice a week.  Watch Your Levels of Cholesterol and Blood Lipids  Have your blood tested for lipids and cholesterol every 5 years starting at 66 years of age. If you are at high risk for heart disease, you should start having your blood tested when you are 66 years old. You may need to have your cholesterol levels checked more often if: ? Your lipid or cholesterol levels are high. ? You are older than 66 years of age. ? You are at high risk for heart disease.  What should I know about cancer screening? Many types of cancers can be detected early and may often be prevented. Lung Cancer  You should be screened every year for lung cancer if: ? You are a current smoker who has smoked for at least 30 years. ? You are a former smoker who has quit within the past 15 years.  Talk to your health care provider about your screening options, when you should start screening, and how often you should be screened.  Colorectal Cancer  Routine colorectal cancer  screening usually begins at 66 years of age and should be repeated every 5-10 years until you are 66 years old. You may need to be screened more often if early forms of precancerous polyps or small growths are found. Your health care provider may recommend screening at an earlier age if you have risk factors for colon cancer.  Your health care provider may recommend using home test kits to check for hidden blood in the stool.  A small camera at the end of a tube can be used to examine your colon (sigmoidoscopy or colonoscopy). This checks for the earliest forms of colorectal cancer.  Prostate and Testicular Cancer  Depending on your age and overall health, your health care provider may do certain tests to screen for prostate and testicular cancer.  Talk to your health care provider about any symptoms or concerns you have about testicular or prostate cancer.  Skin Cancer  Check your skin from head to toe regularly.  Tell your health care provider about any new moles or changes in moles, especially if: ? There is a change in a mole's size, shape, or color. ? You have a mole that is larger than a pencil eraser.  Always use sunscreen. Apply sunscreen liberally and repeat throughout the day.  Protect yourself by wearing long sleeves, pants, a wide-brimmed hat, and sunglasses when outside.  What should I know about heart disease, diabetes, and high blood pressure?  If you are 18-39  years of age, have your blood pressure checked every 3-5 years. If you are 57 years of age or older, have your blood pressure checked every year. You should have your blood pressure measured twice-once when you are at a hospital or clinic, and once when you are not at a hospital or clinic. Record the average of the two measurements. To check your blood pressure when you are not at a hospital or clinic, you can use: ? An automated blood pressure machine at a pharmacy. ? A home blood pressure monitor.  Talk to your  health care provider about your target blood pressure.  If you are between 18-78 years old, ask your health care provider if you should take aspirin to prevent heart disease.  Have regular diabetes screenings by checking your fasting blood sugar level. ? If you are at a normal weight and have a low risk for diabetes, have this test once every three years after the age of 40. ? If you are overweight and have a high risk for diabetes, consider being tested at a younger age or more often.  A one-time screening for abdominal aortic aneurysm (AAA) by ultrasound is recommended for men aged 75-75 years who are current or former smokers. What should I know about preventing infection? Hepatitis B If you have a higher risk for hepatitis B, you should be screened for this virus. Talk with your health care provider to find out if you are at risk for hepatitis B infection. Hepatitis C Blood testing is recommended for:  Everyone born from [birth year] through 1964.  Anyone with known risk factors for hepatitis C.  Sexually Transmitted Diseases (STDs)  You should be screened each year for STDs including gonorrhea and chlamydia if: ? You are sexually active and are younger than 66 years of age. ? You are older than 66 years of age and your health care provider tells you that you are at risk for this type of infection. ? Your sexual activity has changed since you were last screened and you are at an increased risk for chlamydia or gonorrhea. Ask your health care provider if you are at risk.  Talk with your health care provider about whether you are at high risk of being infected with HIV. Your health care provider may recommend a prescription medicine to help prevent HIV infection.  What else can I do?  Schedule regular health, dental, and eye exams.  Stay current with your vaccines (immunizations).  Do not use any tobacco products, such as cigarettes, chewing tobacco, and e-cigarettes. If you need help  quitting, ask your health care provider.  Limit alcohol intake to no more than 2 drinks per day. One drink equals 12 ounces of beer, 5 ounces of wine, or 1 ounces of hard liquor.  Do not use street drugs.  Do not share needles.  Ask your health care provider for help if you need support or information about quitting drugs.  Tell your health care provider if you often feel depressed.  Tell your health care provider if you have ever been abused or do not feel safe at home. This information is not intended to replace advice given to you by your health care provider. Make sure you discuss any questions you have with your health care provider. Document Released: 03/10/2008 Document Revised: 05/11/2016 Document Reviewed: 06/16/2015 Elsevier Interactive Patient Education  [employer].

## 2018-01-29 NOTE — Progress Notes (Signed)
Subjective:    Patient ID: Gregory Hale, male    DOB: 1950-11-16, 67 y.o.   MRN: [id number]  HPI 67 year old patient who is followed closely by cardiology with a history of coronary artery disease.  He is status post STEMI July 2018 with DES to RCA with moderate LAD disease with 30% lesion past the first diagonal branch and a 70% D1 lesion.  He has a normal ejection fraction.  He is seen today for a welcome to Medicare visit.  Clinically he is doing quite well.  He remains on high intensity statin  Social history retired married  Colonoscopy 2011  Past Medical History:  Diagnosis Date  . Arthritis   . Ascending aorta dilatation (HCC)   . Bradycardia    a. HR 40 in setting of acute inferior STEMI 03/2017.  Mar[place] Kitchen CAD in native artery    a. inf STEMI 03/2017 a/w hypotension and bradycardia -> s/p DES to RCA, otherwise mild disease in LAD, moderate disease in ostium of the moderate caliber diagonal branch, normal LVEF >65%  . Dilated aortic root ([facility])   . STEMI involving right coronary artery ([facility])    04/22/17 PCI/DES to mRCA, normal EF  . Tobacco abuse    Quit in 1998     Social History   Socioeconomic History  . Marital status: Married    Spouse name: Not on file  . Number of children: Not on file  . Years of education: Not on file  . Highest education level: Not on file  Occupational History  . Not on file  Social Needs  . Financial resource strain: Not on file  . Food insecurity:    Worry: Not on file    Inability: Not on file  . Transportation needs:    Medical: Not on file    Non-medical: Not on file  Tobacco Use  . Smoking status: Former Research scientist (life sciences)  . Smokeless tobacco: Never Used  Substance and Sexual Activity  . Alcohol use: Yes    Alcohol/week: 1.2 - 1.8 oz    Types: 2 - 3 Cans of beer per week  . Drug use: No  . Sexual activity: Not on file  Lifestyle  . Physical activity:    Days per week: Not on file    Minutes per session: Not on file  . Stress: Not on file    Relationships  . Social connections:    Talks on phone: Not on file    Gets together: Not on file    Attends religious service: Not on file    Active member of club or organization: Not on file    Attends meetings of clubs or organizations: Not on file    Relationship status: Not on file  . Intimate partner violence:    Fear of current or ex partner: Not on file    Emotionally abused: Not on file    Physically abused: Not on file    Forced sexual activity: Not on file  Other Topics Concern  . Not on file  Social History Narrative  . Not on file    Past Surgical History:  Procedure Laterality Date  . CORONARY STENT INTERVENTION N/A 04/22/2017   Procedure: Coronary Stent Intervention;  Surgeon: [name], MD;  Location: [facility];  Service: Cardiovascular;  Laterality: N/A;  . LEFT HEART CATH AND CORONARY ANGIOGRAPHY N/A 04/22/2017   Procedure: Left Heart Cath and Coronary Angiography;  Surgeon: [name], MD;  Location: [facility];  Service: Cardiovascular;  Laterality: N/A;  . parotid Left 1980  . TONSILLECTOMY     as a child    Family History  Problem Relation Age of Onset  . CAD Brother     No Known Allergies  Current Outpatient Medications on File Prior to Visit  Medication Sig Dispense Refill  . aspirin 81 MG chewable tablet Chew 1 tablet (81 mg total) by mouth daily. 30 tablet   . atorvastatin (LIPITOR) 80 MG tablet TAKE 1 TABLET (80 MG TOTAL) BY MOUTH DAILY AT 6 PM. 90 tablet 3  . metoprolol tartrate (LOPRESSOR) 25 MG tablet TAKE 0.5 TABLETS (12.5 MG TOTAL) BY MOUTH 2 (TWO) TIMES DAILY. 90 tablet 3  . nitroGLYCERIN (NITROSTAT) 0.4 MG SL tablet Place 1 tablet (0.4 mg total) under the tongue every 5 (five) minutes as needed. 25 tablet 3  . ticagrelor (BRILINTA) 90 MG TABS tablet Take 1 tablet (90 mg total) by mouth 2 (two) times daily. 60 tablet 10  . losartan (COZAAR) 25 MG tablet Take 1 tablet (25 mg total) by mouth daily.  90 tablet 3   No current facility-administered medications on file prior to visit.     BP 100/60 (BP Location: Right Arm, Patient Position: Sitting, Cuff Size: Large)   Pulse 66   Temp 98.5 F (36.9 C) (Oral)   Ht 5\' 10"  (1.778 m)   Wt 191 lb (86.6 kg)   SpO2 100%   BMI 27.41 kg/m    Welcome to Medicare visit  1. Risk factors, based on past  M,S,F history.  Patient has known coronary artery disease he is status post PCI/DES to RCA  2.  Physical activities: No activity restrictions; remains quite active with household activities and travel  3.  Depression/mood: No history of major depression or mood disorder.  Very much enjoying recent retirement  4.  Hearing: No deficits  5.  ADL's: Independent  6.  Fall risk: Low  7.  Home safety: No problems identified  8.  Height weight, and visual acuity; height and weight stable no change in visual acuity  9.  Counseling: Heart healthy diet regular exercise encouraged  10. Lab orders based on risk factors: Recent laboratory studies were reviewed  11. Referral :  Follow-up cardiology 12. Care plan: Continue efforts at aggressive risk factor modification  13. Cognitive assessment: Alert and oriented with normal affect.  No cognitive dysfunction  14. Screening: Patient provided with a written and personalized 5-10 year screening schedule in the AVS.    15. Provider List Update: Cardiology primary care GI ophthalmology  16.  Advanced directives: Patient has a living will and healthcare power of attorney.  Patient has known coronary artery disease.    Review of Systems  Constitutional: Negative for appetite change, chills, fatigue and fever.  HENT: Negative for congestion, dental problem, ear pain, hearing loss, sore throat, tinnitus, trouble swallowing and voice change.   Eyes: Negative for pain, discharge and visual disturbance.  Respiratory: Negative for cough, chest tightness, wheezing and stridor.   Cardiovascular:  Negative for chest pain, palpitations and leg swelling.  Gastrointestinal: Negative for abdominal distention, abdominal pain, blood in stool, constipation, diarrhea, nausea and vomiting.  Genitourinary: Negative for difficulty urinating, discharge, flank pain, genital sores, hematuria and urgency.  Musculoskeletal: Negative for arthralgias, back pain, gait problem, joint swelling, myalgias and neck stiffness.  Skin: Negative for rash.  Neurological: Negative for dizziness, syncope, speech difficulty, weakness, numbness and headaches.  Hematological: Negative for adenopathy.  Does not bruise/bleed easily.  Psychiatric/Behavioral: Negative for behavioral problems and dysphoric mood. The patient is not nervous/anxious.        Objective:   Physical Exam  Constitutional: He is oriented to person, place, and time. He appears well-developed.  Blood pressure low normal  HENT:  Head: Normocephalic.  Right Ear: External ear normal.  Left Ear: External ear normal.  Eyes: Conjunctivae and EOM are normal.  Neck: Normal range of motion.  Cardiovascular: Normal rate and normal heart sounds.  Pulmonary/Chest: Breath sounds normal.  Abdominal: Bowel sounds are normal.  Genitourinary:  Genitourinary Comments: External hemorrhoids noted making the examination difficult  Musculoskeletal: Normal range of motion. He exhibits no edema or tenderness.  Neurological: He is alert and oriented to person, place, and time.  Skin:  A few scattered ecchymoses  Psychiatric: He has a normal mood and affect. His behavior is normal.          Assessment & Plan:   Welcome to Medicare visit Coronary artery disease.  Status post PCI/DES to RCA.  Normal ejection fraction.  Follow cardiology.  Continue efforts at aggressive risk factor modification and high intensity statin therapy  Cardiology follow-up as scheduled Return in 1 year for follow-up Patient will establish with a new primary care  provider  Nyoka Cowden

## 2018-02-20 ENCOUNTER — Telehealth: Payer: Self-pay | Admitting: *Deleted

## 2018-02-20 NOTE — Telephone Encounter (Signed)
Yes okay to administer

## 2018-02-20 NOTE — Telephone Encounter (Signed)
Patient would like to receive Shingrix vaccine. Ok to administer?

## 2018-02-21 NOTE — Telephone Encounter (Signed)
Left message instructing patient to get Shingrix at pharmacy due to having Medicare as his primary insurance.

## 2018-03-04 ENCOUNTER — Other Ambulatory Visit: Payer: Self-pay | Admitting: Cardiology

## 2018-03-05 ENCOUNTER — Telehealth: Payer: Self-pay | Admitting: Cardiovascular Disease

## 2018-03-05 MED ORDER — CLOPIDOGREL BISULFATE 75 MG PO TABS: 75.0000 mg | ORAL_TABLET | Freq: Every day | ORAL | 3 refills | Status: DC

## 2018-03-05 NOTE — Telephone Encounter (Signed)
New message    Pt c/o medication issue:  1. Name of Medication: ticagrelor (BRILINTA) 90 MG TABS tablet  2. How are you currently taking this medication (dosage and times per day)? Take 1 tablet (90 mg total) by mouth 2 (two) times daily.  3. Are you having a reaction (difficulty breathing--STAT)? No   4. What is your medication issue? Was told prescription would be switch over to plavix   From office notes patient stated Dr. Angelena Form was going to switch over medication .    [facility] [address]

## 2018-03-05 NOTE — Telephone Encounter (Signed)
Spoke with the pt and he reports that per his last visit with Dr. Angelena Form 5/19..he was to call in re: switching over his Brilinta to Plavix and ASA in July. He has 10 days left and wants to be sure that is the plan.

## 2018-03-05 NOTE — Telephone Encounter (Signed)
Pt verbalized understanding to continue Brilinta for at least another month and will send in an RX for Plavix for the pharmacy to profile for him to fill after stopping Brilinta closer to the end of July. Pt is on and will continue ASA 81mg  every day.

## 2018-04-02 ENCOUNTER — Telehealth: Payer: Self-pay

## 2018-04-02 MED ORDER — CLOPIDOGREL BISULFATE 75 MG PO TABS: 75.0000 mg | ORAL_TABLET | Freq: Every day | ORAL | 3 refills | Status: DC

## 2018-04-02 NOTE — Telephone Encounter (Signed)
I spoke with pt and told him he should continue Brilinta until July 28,2019.  After this he should change to Clopidogrel 75 mg daily. Pt aware to continue aspirin. [facility]

## 2018-04-11 ENCOUNTER — Other Ambulatory Visit: Payer: Self-pay | Admitting: Physician Assistant

## 2018-06-05 DIAGNOSIS — H353131 Nonexudative age-related macular degeneration, bilateral, early dry stage: Secondary | ICD-10-CM | POA: Diagnosis not present

## 2018-06-05 DIAGNOSIS — H43811 Vitreous degeneration, right eye: Secondary | ICD-10-CM | POA: Diagnosis not present

## 2018-06-05 DIAGNOSIS — H40023 Open angle with borderline findings, high risk, bilateral: Secondary | ICD-10-CM | POA: Diagnosis not present

## 2018-06-05 DIAGNOSIS — H5203 Hypermetropia, bilateral: Secondary | ICD-10-CM | POA: Diagnosis not present

## 2018-06-05 DIAGNOSIS — H2513 Age-related nuclear cataract, bilateral: Secondary | ICD-10-CM | POA: Diagnosis not present

## 2018-06-21 DIAGNOSIS — M7702 Medial epicondylitis, left elbow: Secondary | ICD-10-CM | POA: Diagnosis not present

## 2018-06-21 DIAGNOSIS — M25522 Pain in left elbow: Secondary | ICD-10-CM | POA: Diagnosis not present

## 2018-07-03 DIAGNOSIS — Z23 Encounter for immunization: Secondary | ICD-10-CM | POA: Diagnosis not present

## 2018-07-13 DIAGNOSIS — M7702 Medial epicondylitis, left elbow: Secondary | ICD-10-CM | POA: Diagnosis not present

## 2018-07-24 DIAGNOSIS — N5201 Erectile dysfunction due to arterial insufficiency: Secondary | ICD-10-CM | POA: Diagnosis not present

## 2018-07-30 ENCOUNTER — Other Ambulatory Visit: Payer: Medicare Other | Admitting: *Deleted

## 2018-07-30 DIAGNOSIS — I712 Thoracic aortic aneurysm, without rupture, unspecified: Secondary | ICD-10-CM

## 2018-07-30 DIAGNOSIS — H903 Sensorineural hearing loss, bilateral: Secondary | ICD-10-CM | POA: Diagnosis not present

## 2018-07-30 DIAGNOSIS — Z822 Family history of deafness and hearing loss: Secondary | ICD-10-CM | POA: Diagnosis not present

## 2018-07-30 DIAGNOSIS — Z570 Occupational exposure to noise: Secondary | ICD-10-CM | POA: Diagnosis not present

## 2018-07-30 LAB — BASIC METABOLIC PANEL
BUN / CREAT RATIO: 14 (ref 10–24)
BUN: 14 mg/dL (ref 8–27)
CO2: 26 mmol/L (ref 20–29)
CREATININE: 0.98 mg/dL (ref 0.76–1.27)
Calcium: 9.1 mg/dL (ref 8.6–10.2)
Chloride: 103 mmol/L (ref 96–106)
GFR, EST AFRICAN AMERICAN: 92 mL/min/{1.73_m2} (ref >59)
GFR, EST NON AFRICAN AMERICAN: 80 mL/min/{1.73_m2} (ref >59)
Glucose: 116 mg/dL — ABNORMAL HIGH (ref 65–99)
Potassium: 3.6 mmol/L (ref 3.5–5.2)
Sodium: 144 mmol/L (ref 134–144)

## 2018-08-06 ENCOUNTER — Ambulatory Visit (HOSPITAL_COMMUNITY)
Admission: RE | Admit: 2018-08-06 | Discharge: 2018-08-06 | Disposition: A | Discharge status: Home / Self Care | Payer: Medicare Other | Source: Ambulatory Visit | Attending: Cardiovascular Disease | Admitting: Cardiovascular Disease

## 2018-08-06 ENCOUNTER — Telehealth: Payer: Self-pay | Admitting: *Deleted

## 2018-08-06 DIAGNOSIS — I712 Thoracic aortic aneurysm, without rupture, unspecified: Secondary | ICD-10-CM

## 2018-08-06 MED ORDER — GADOBUTROL 1 MMOL/ML IV SOLN
10.0000 mL | Freq: Once | INTRAVENOUS | Status: AC | PRN
  Administered 2018-08-06: 10 mL via INTRAVENOUS

## 2018-08-06 NOTE — Telephone Encounter (Signed)
-----   Message from [name], MD sent at 08/06/2018  1:51 PM EST ----- Ascending aorta is still mildly dilated. No change since last study. Will need yearly follow up. cdm

## 2018-08-06 NOTE — Telephone Encounter (Signed)
Will need BMP prior to MRA

## 2018-08-06 NOTE — Telephone Encounter (Signed)
Pt notified.  He will see Dr. Angelena Form in May/June 2020.  I told him we could arrange study at that time for November 2020

## 2018-08-06 NOTE — Addendum Note (Signed)
Addended by: Thompson Grayer on: 08/06/2018 02:10 PM   Modules accepted: Orders

## 2018-08-21 DIAGNOSIS — M7702 Medial epicondylitis, left elbow: Secondary | ICD-10-CM | POA: Diagnosis not present

## 2018-09-21 ENCOUNTER — Other Ambulatory Visit: Payer: Self-pay | Admitting: *Deleted

## 2018-09-21 MED ORDER — ATORVASTATIN CALCIUM 80 MG PO TABS: 80.0000 mg | ORAL_TABLET | Freq: Every day | ORAL | 0 refills | Status: DC

## 2018-09-27 ENCOUNTER — Other Ambulatory Visit: Payer: Self-pay

## 2018-09-27 MED ORDER — ATORVASTATIN CALCIUM 80 MG PO TABS: 80.0000 mg | ORAL_TABLET | Freq: Every day | ORAL | 0 refills | Status: DC

## 2018-09-28 ENCOUNTER — Other Ambulatory Visit: Payer: Self-pay

## 2018-09-28 MED ORDER — METOPROLOL TARTRATE 25 MG PO TABS: 12.5000 mg | ORAL_TABLET | Freq: Two times a day (BID) | ORAL | 1 refills | Status: DC

## 2018-10-14 IMAGING — CT CT ANGIO CHEST
2 of 7 series · 19 of 46 positions shown · IV contrast (iopamidol)
Comparison: None.

CLINICAL DATA: Evaluate ascending aortic aneurysm seen on ECHO

EXAM:
CT ANGIOGRAPHY CHEST WITH CONTRAST
TECHNIQUE: Multidetector CT imaging of the chest was performed using the
standard protocol during bolus administration of intravenous
contrast. Multiplanar CT image reconstructions and MIPs were
obtained to evaluate the vascular anatomy.
CONTRAST:  100mL N2YTBZ-9KC IOPAMIDOL (N2YTBZ-9KC) INJECTION 76%

[Series 4: aorta 3.0 i31f 2 · axial · 0.81mm/px · z∈[-344,-38]mm · 16 of 112 slices shown]
[im 5/112  lung]
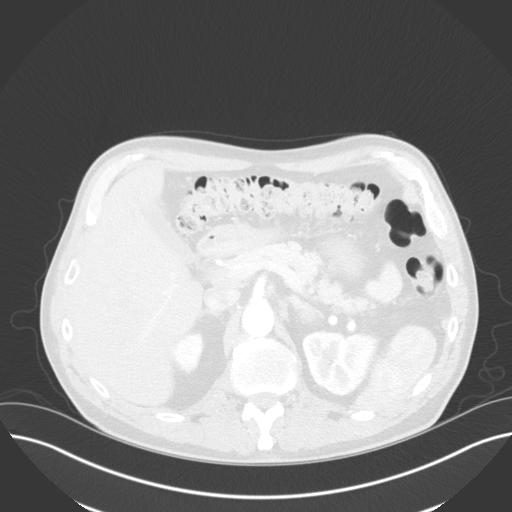
[im 13/112  soft-tissue]
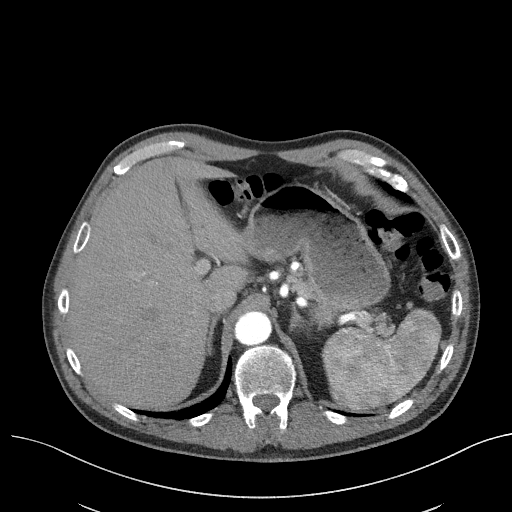
[im 21/112  lung]
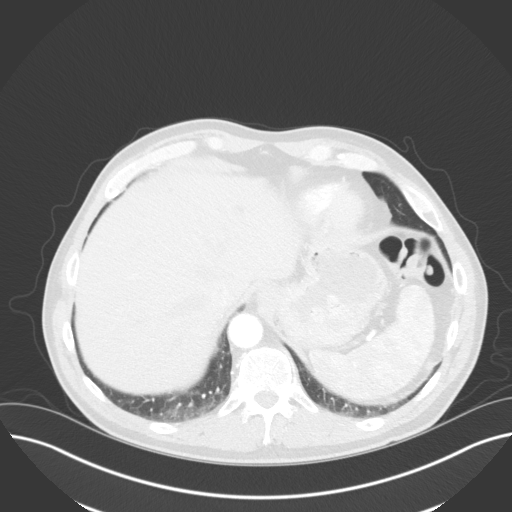
[im 25/112  soft-tissue]
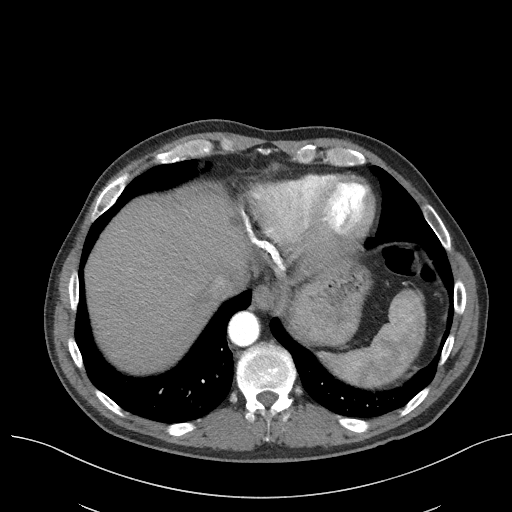
[im 33/112  lung]
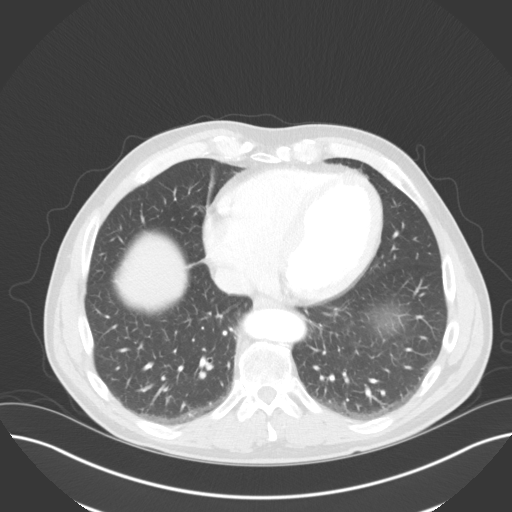
[im 38/112  soft-tissue]
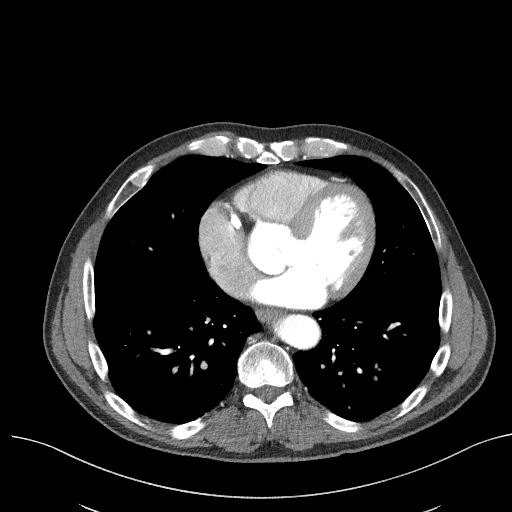
[im 46/112  lung]
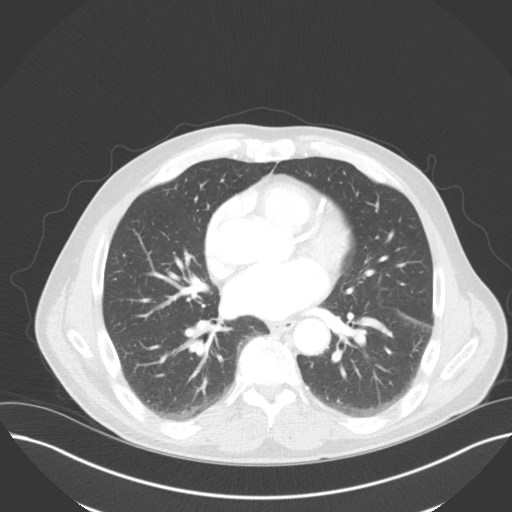
[im 54/112  soft-tissue]
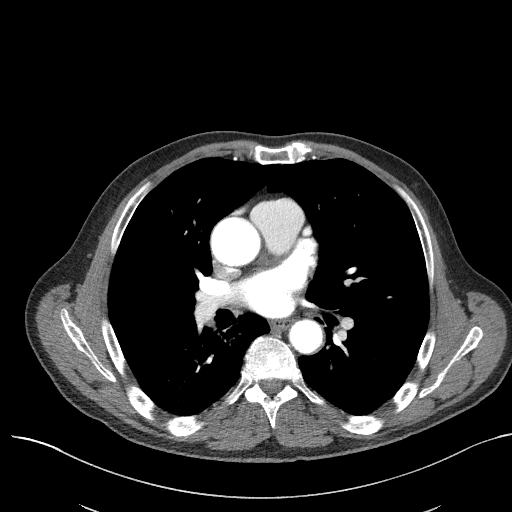
[im 58/112  lung]
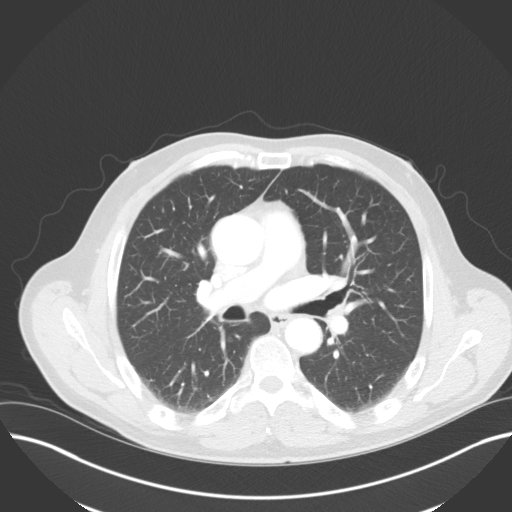
[im 66/112  soft-tissue]
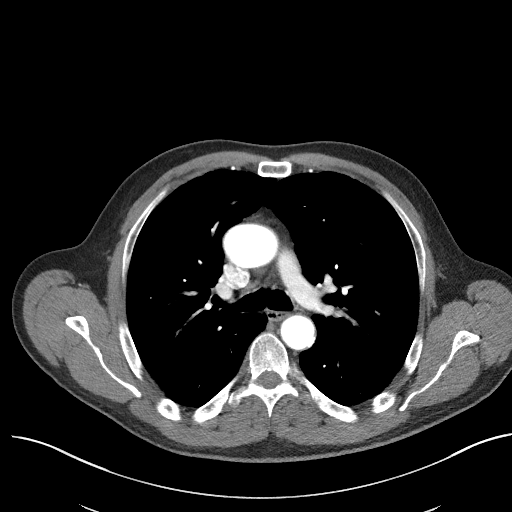
[im 75/112  lung]
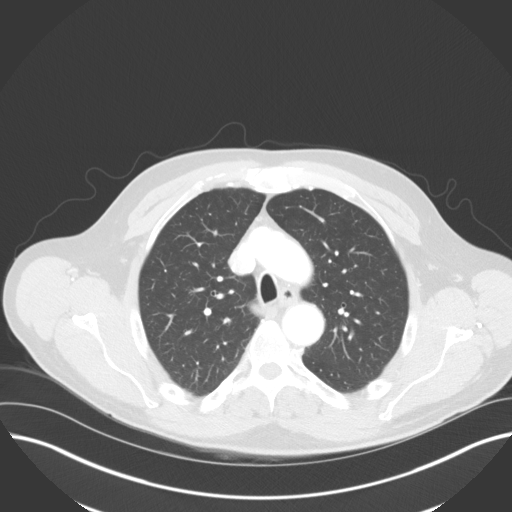
[im 79/112  soft-tissue]
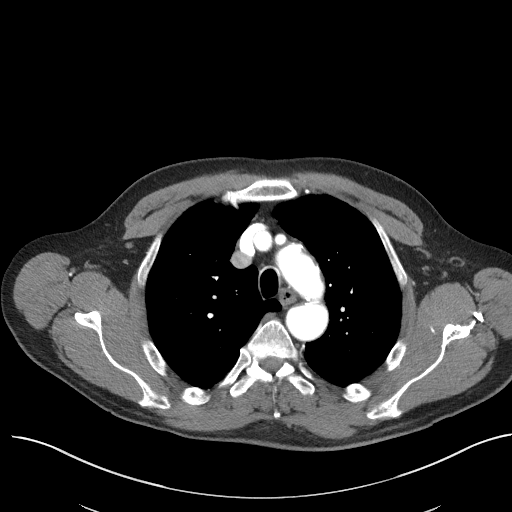
[im 87/112  lung]
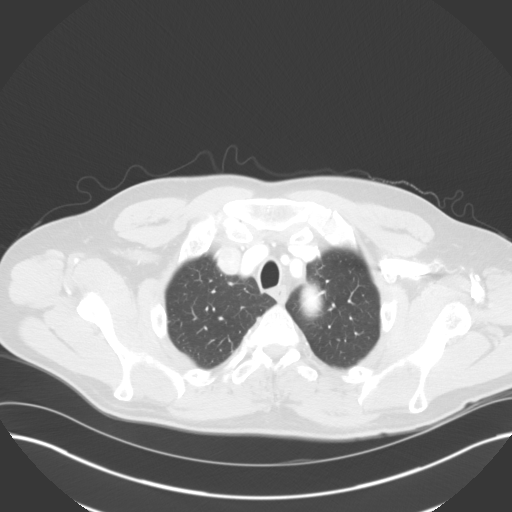
[im 91/112  soft-tissue]
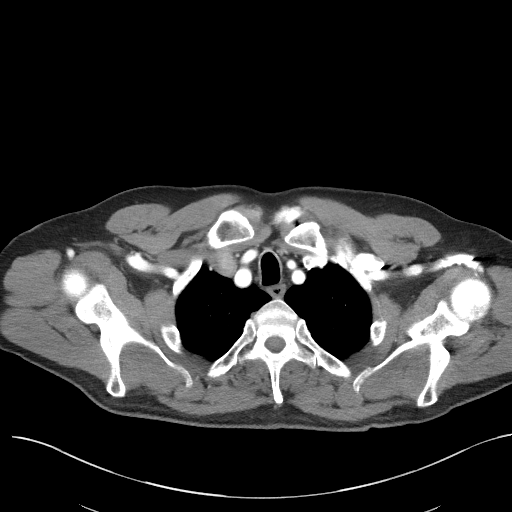
[im 99/112  lung]
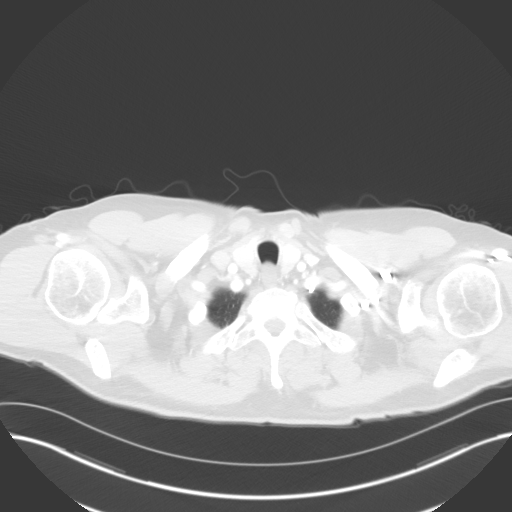
[im 107/112  soft-tissue]
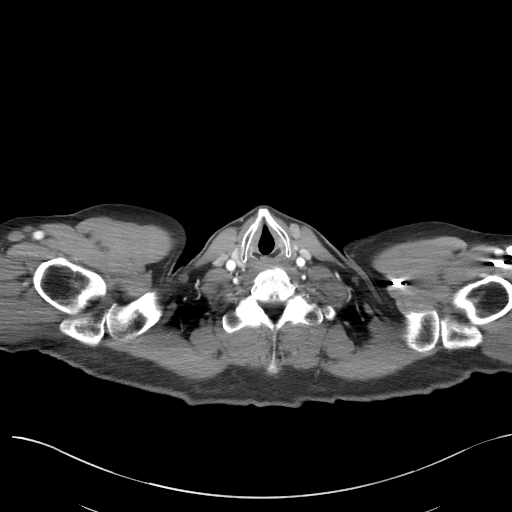

[Series 7: coronals · coronal · 0.67mm/px · 3 of 129 slices shown]
[im 33/129  soft-tissue]
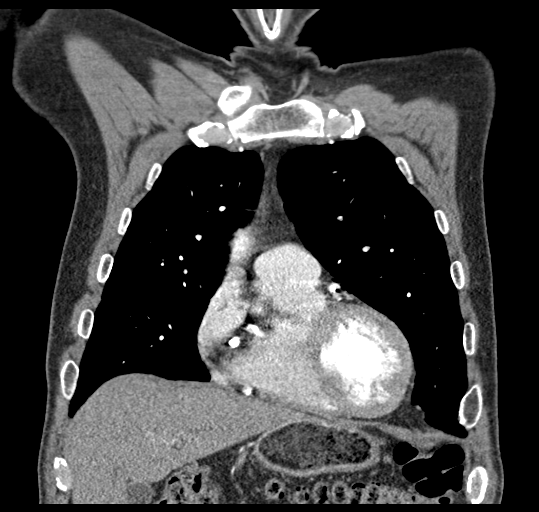
[im 65/129  soft-tissue]
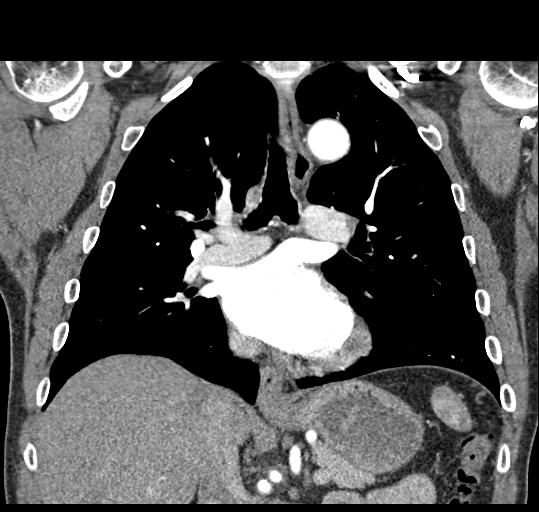
[im 97/129  soft-tissue]
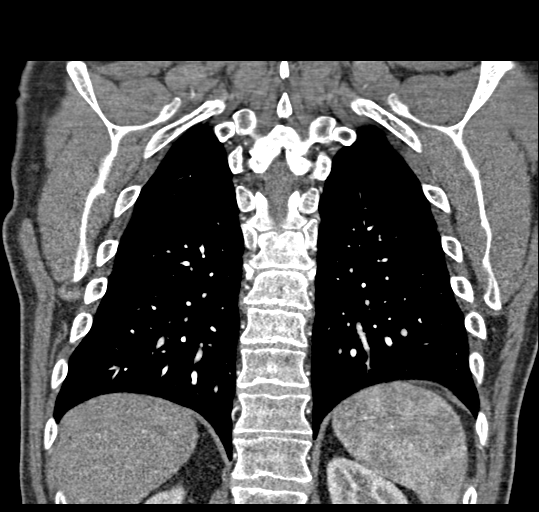

[19 of 46 positions shown; findings below may reference images not displayed]

FINDINGS: Cardiovascular: Mild aneurysmal dilatation of the aortic root,
measuring 4.3 cm at the sinuses of Valsalva. 4 cm maximum diameter
of the ascending thoracic aorta. Proximal descending thoracic aorta
3.4 cm maximally. Mild tortuosity of the thoracic aorta with
scattered calcifications. Extensive calcification in the coronary
arteries. Heart is normal size.

Mediastinum/Nodes: No mediastinal, hilar, or axillary adenopathy.
Trachea and esophagus are unremarkable.

Lungs/Pleura: Lungs are clear. No focal airspace opacities or
suspicious nodules. No effusions.

Upper Abdomen: Imaging into the upper abdomen shows no acute
findings.

Musculoskeletal: Chest wall soft tissues are unremarkable. No acute
bony abnormality.

Review of the MIP images confirms the above findings.
IMPRESSION: Ascending aortic aneurysm with maximum diameter 4.3 cm at the
sinuses of Valsalva. Recommend annual imaging followup by CTA or
MRA. This recommendation follows 4686
ACCF/AHA/AATS/ACR/ASA/SCA/MARES/POLIN/YOOKKO/ENANG Guidelines for the
Diagnosis and Management of Patients with Thoracic Aortic Disease.
Circulation. 4686; 121: [id number]

Extensive coronary artery disease.

Aortic Atherosclerosis (RALBO-HGL.L).

## 2018-10-23 DIAGNOSIS — M25521 Pain in right elbow: Secondary | ICD-10-CM | POA: Diagnosis not present

## 2018-10-23 DIAGNOSIS — M7702 Medial epicondylitis, left elbow: Secondary | ICD-10-CM | POA: Diagnosis not present

## 2018-10-30 DIAGNOSIS — M25521 Pain in right elbow: Secondary | ICD-10-CM | POA: Diagnosis not present

## 2018-11-06 DIAGNOSIS — M25521 Pain in right elbow: Secondary | ICD-10-CM | POA: Diagnosis not present

## 2018-12-04 ENCOUNTER — Other Ambulatory Visit: Payer: Self-pay | Admitting: Internal Medicine

## 2018-12-04 ENCOUNTER — Telehealth: Payer: Self-pay | Admitting: General Practice

## 2018-12-04 NOTE — Telephone Encounter (Signed)
Yes. I will see him.  TJ

## 2018-12-04 NOTE — Telephone Encounter (Signed)
Copied from [place] [phone]. Topic: General - Other >> Dec 03, 2018  5:00 PM Wynetta Emery, [place] C wrote: Reason for CRM: pt's spouse Blanch Media called in to ask if either Dr. Jenny Reichmann or Dr. Ronnald Ramp would take pt on as a np.  Pt is transitioning from Dr. Raliegh Ip.   Please advise.    CB: [phone]

## 2018-12-05 NOTE — Telephone Encounter (Signed)
Patient made appointment at [facility]

## 2018-12-11 ENCOUNTER — Encounter: Payer: Self-pay | Admitting: Internal Medicine

## 2018-12-11 ENCOUNTER — Other Ambulatory Visit (INDEPENDENT_AMBULATORY_CARE_PROVIDER_SITE_OTHER): Payer: Medicare Other

## 2018-12-11 ENCOUNTER — Other Ambulatory Visit: Payer: Self-pay

## 2018-12-11 ENCOUNTER — Ambulatory Visit (INDEPENDENT_AMBULATORY_CARE_PROVIDER_SITE_OTHER): Payer: Medicare Other | Admitting: Internal Medicine

## 2018-12-11 VITALS — BP 154/84 | HR 56 | Temp 98.2°F | Resp 16 | Ht 70.0 in | Wt 191.5 lb

## 2018-12-11 DIAGNOSIS — R739 Hyperglycemia, unspecified: Secondary | ICD-10-CM

## 2018-12-11 DIAGNOSIS — I2510 Atherosclerotic heart disease of native coronary artery without angina pectoris: Secondary | ICD-10-CM

## 2018-12-11 DIAGNOSIS — E039 Hypothyroidism, unspecified: Secondary | ICD-10-CM | POA: Diagnosis not present

## 2018-12-11 DIAGNOSIS — R0989 Other specified symptoms and signs involving the circulatory and respiratory systems: Secondary | ICD-10-CM | POA: Diagnosis not present

## 2018-12-11 DIAGNOSIS — I712 Thoracic aortic aneurysm, without rupture, unspecified: Secondary | ICD-10-CM

## 2018-12-11 DIAGNOSIS — M170 Bilateral primary osteoarthritis of knee: Secondary | ICD-10-CM | POA: Diagnosis not present

## 2018-12-11 DIAGNOSIS — I77810 Thoracic aortic ectasia: Secondary | ICD-10-CM

## 2018-12-11 DIAGNOSIS — N400 Enlarged prostate without lower urinary tract symptoms: Secondary | ICD-10-CM | POA: Diagnosis not present

## 2018-12-11 DIAGNOSIS — Z23 Encounter for immunization: Secondary | ICD-10-CM

## 2018-12-11 DIAGNOSIS — E785 Hyperlipidemia, unspecified: Secondary | ICD-10-CM

## 2018-12-11 DIAGNOSIS — Z0000 Encounter for general adult medical examination without abnormal findings: Secondary | ICD-10-CM

## 2018-12-11 DIAGNOSIS — Z1159 Encounter for screening for other viral diseases: Secondary | ICD-10-CM | POA: Diagnosis not present

## 2018-12-11 DIAGNOSIS — I10 Essential (primary) hypertension: Secondary | ICD-10-CM | POA: Diagnosis not present

## 2018-12-11 LAB — CBC WITH DIFFERENTIAL/PLATELET
Basophils Absolute: 0 10*3/uL (ref 0.0–0.1)
Basophils Relative: 0.7 % (ref 0.0–3.0)
Eosinophils Absolute: 0.2 10*3/uL (ref 0.0–0.7)
Eosinophils Relative: 2.1 % (ref 0.0–5.0)
HEMATOCRIT: 46.3 % (ref 39.0–52.0)
Hemoglobin: 15.7 g/dL (ref 13.0–17.0)
LYMPHS PCT: 26 % (ref 12.0–46.0)
Lymphs Abs: 1.9 10*3/uL (ref 0.7–4.0)
MCHC: 33.9 g/dL (ref 30.0–36.0)
MCV: 92.8 fl (ref 78.0–100.0)
Monocytes Absolute: 0.8 10*3/uL (ref 0.1–1.0)
Monocytes Relative: 10.5 % (ref 3.0–12.0)
NEUTROS ABS: 4.5 10*3/uL (ref 1.4–7.7)
Neutrophils Relative %: 60.7 % (ref 43.0–77.0)
Platelets: 156 10*3/uL (ref 150.0–400.0)
RBC: 4.98 Mil/uL (ref 4.22–5.81)
RDW: 13.8 % (ref 11.5–15.5)
WBC: 7.4 10*3/uL (ref 4.0–10.5)

## 2018-12-11 LAB — LIPID PANEL
CHOLESTEROL: 128 mg/dL (ref 0–200)
HDL: 62.5 mg/dL (ref >39.00)
LDL Cholesterol: 59 mg/dL (ref 0–99)
NonHDL: 65.37
TRIGLYCERIDES: 31 mg/dL (ref 0.0–149.0)
Total CHOL/HDL Ratio: 2
VLDL: 6.2 mg/dL (ref 0.0–40.0)

## 2018-12-11 LAB — COMPREHENSIVE METABOLIC PANEL
ALT: 17 U/L (ref 0–53)
AST: 18 U/L (ref 0–37)
Albumin: 4.3 g/dL (ref 3.5–5.2)
Alkaline Phosphatase: 70 U/L (ref 39–117)
BUN: 19 mg/dL (ref 6–23)
CALCIUM: 9.3 mg/dL (ref 8.4–10.5)
CO2: 31 mEq/L (ref 19–32)
Chloride: 104 mEq/L (ref 96–112)
Creatinine, Ser: 0.98 mg/dL (ref 0.40–1.50)
GFR: 76.38 mL/min (ref >60.00)
Glucose, Bld: 111 mg/dL — ABNORMAL HIGH (ref 70–99)
Potassium: 4.7 mEq/L (ref 3.5–5.1)
Sodium: 141 mEq/L (ref 135–145)
Total Bilirubin: 1.1 mg/dL (ref 0.2–1.2)
Total Protein: 6.9 g/dL (ref 6.0–8.3)

## 2018-12-11 LAB — HEMOGLOBIN A1C: Hgb A1c MFr Bld: 5.6 % (ref 4.6–6.5)

## 2018-12-11 LAB — TSH: TSH: 3.78 u[IU]/mL (ref 0.35–4.50)

## 2018-12-11 LAB — PSA: PSA: 0.6 ng/mL (ref 0.10–4.00)

## 2018-12-11 MED ORDER — CELECOXIB 100 MG PO CAPS: 100.0000 mg | ORAL_CAPSULE | Freq: Every day | ORAL | 1 refills | Status: DC

## 2018-12-11 MED ORDER — AZILSARTAN MEDOXOMIL 80 MG PO TABS: 1.0000 | ORAL_TABLET | Freq: Every day | ORAL | 1 refills | Status: DC

## 2018-12-11 NOTE — Progress Notes (Signed)
Subjective:  Patient ID: Gregory Hale, male    DOB: [birth year]  Age: 68 y.o. MRN: [id number]  CC: Annual Exam; Hypertension; Coronary Artery Disease; and Hyperlipidemia  NEW TO ME  HPI Gregory Hale presents for a CPX.  He complains of chronic worsening joint pain, mostly in his knees. He has pain in other joints as well.  He cannot take traditional NSAIDs because they induce bleeding.  [place] and denies any recent episodes of CP, DOE, palpitations, edema, or fatigue.  He does not monitor his blood pressure but he tells me he has been compliant with losartan and metoprolol.  He has a history of hypothyroidism but is not taking a thyroid supplement.  History Gregory Hale has a past medical history of Arthritis, Ascending aorta dilatation (HCC), Bradycardia, CAD in native artery, Dilated aortic root ([place]), STEMI involving right coronary artery ([place]), and Tobacco abuse.   He has a past surgical history that includes parotid (Left, 1980); Tonsillectomy; LEFT HEART CATH AND CORONARY ANGIOGRAPHY (N/A, 04/22/2017); and CORONARY STENT INTERVENTION (N/A, 04/22/2017).   His family history includes CAD in his brother.He reports that he has quit smoking. He has never used smokeless tobacco. He reports current alcohol use of about 10.0 standard drinks of alcohol per week. He reports that he does not use drugs.  Outpatient Medications Prior to Visit  Medication Sig Dispense Refill  . aspirin 81 MG chewable tablet Chew 1 tablet (81 mg total) by mouth daily. 30 tablet   . atorvastatin (LIPITOR) 80 MG tablet Take 1 tablet (80 mg total) by mouth daily at 6 PM. 90 tablet 0  . clopidogrel (PLAVIX) 75 MG tablet Take 1 tablet (75 mg total) by mouth daily. 90 tablet 3  . metoprolol tartrate (LOPRESSOR) 25 MG tablet Take 0.5 tablets (12.5 mg total) by mouth 2 (two) times daily. 90 tablet 1  . nitroGLYCERIN (NITROSTAT) 0.4 MG SL tablet Place 1 tablet (0.4 mg total) under the tongue every  5 (five) minutes as needed. 25 tablet 3  . losartan (COZAAR) 25 MG tablet TAKE 1 TABLET BY MOUTH EVERY DAY 90 tablet 3   No facility-administered medications prior to visit.     ROS Review of Systems  Constitutional: Negative.  Negative for diaphoresis, fatigue and unexpected weight change.  HENT: Negative.  Negative for trouble swallowing.   Eyes: Negative for visual disturbance.  Respiratory: Negative for cough, chest tightness, shortness of breath and wheezing.   Cardiovascular: Negative for chest pain, palpitations and leg swelling.  Gastrointestinal: Negative for abdominal pain, constipation, diarrhea, nausea and vomiting.  Endocrine: Negative.  Negative for cold intolerance and heat intolerance.  Genitourinary: Negative.  Negative for difficulty urinating, dysuria, penile swelling, scrotal swelling, testicular pain and urgency.  Musculoskeletal: Positive for arthralgias. Negative for myalgias and neck pain.  Skin: Negative.  Negative for color change.  Neurological: Negative for dizziness, weakness and light-headedness.  Hematological: Negative for adenopathy. Does not bruise/bleed easily.  Psychiatric/Behavioral: Negative.     Objective:  BP (!) 154/84 (BP Location: Left Arm, Patient Position: Sitting, Cuff Size: Normal)   Pulse (!) 56   Temp 98.2 F (36.8 C) (Oral)   Resp 16   Ht 5\' 10"  (1.778 m)   Wt 191 lb 8 oz (86.9 kg)   SpO2 93%   BMI 27.48 kg/m   Physical Exam Vitals signs reviewed.  Constitutional:      Appearance: He is not ill-appearing or diaphoretic.  HENT:  Nose: Nose normal. No congestion or rhinorrhea.     Mouth/Throat:     Mouth: Mucous membranes are moist.     Pharynx: Oropharynx is clear. No oropharyngeal exudate or posterior oropharyngeal erythema.  Eyes:     General: No scleral icterus.    Conjunctiva/sclera: Conjunctivae normal.  Neck:     Musculoskeletal: Normal range of motion and neck supple. No muscular tenderness.  Cardiovascular:      Rate and Rhythm: Regular rhythm. Bradycardia present.     Pulses:          Carotid pulses are 1+ on the right side and 1+ on the left side.      Radial pulses are 1+ on the right side and 1+ on the left side.       Femoral pulses are 1+ on the right side and 1+ on the left side.      Popliteal pulses are 1+ on the right side and 1+ on the left side.       Dorsalis pedis pulses are 1+ on the right side and 1+ on the left side.       Posterior tibial pulses are 1+ on the right side and 1+ on the left side.     Heart sounds: Normal heart sounds, S1 normal and S2 normal. No systolic murmur. No diastolic murmur. No gallop.   Pulmonary:     Effort: Pulmonary effort is normal.     Breath sounds: No stridor. No wheezing, rhonchi or rales.  Abdominal:     General: Abdomen is flat. Bowel sounds are normal. There is abdominal bruit.     Palpations: Abdomen is soft. There is no hepatomegaly or splenomegaly.     Tenderness: There is no abdominal tenderness.     Hernia: No hernia is present.  Genitourinary:    Pubic Area: No rash.      Penis: Normal and circumcised. No discharge, swelling or lesions.      Scrotum/Testes: Normal.  Musculoskeletal: Normal range of motion.        General: No swelling or tenderness.     Right lower leg: No edema.     Left lower leg: No edema.  Skin:    General: Skin is warm.     Findings: No rash.  Neurological:     General: No focal deficit present.     Mental Status: He is oriented to person, place, and time. Mental status is at baseline.  Psychiatric:        Mood and Affect: Mood normal.        Behavior: Behavior normal.        Thought Content: Thought content normal.     Lab Results  Component Value Date   WBC 7.4 12/11/2018   HGB 15.7 12/11/2018   HCT 46.3 12/11/2018   PLT 156.0 12/11/2018   GLUCOSE 111 (H) 12/11/2018   CHOL 128 12/11/2018   TRIG 31.0 12/11/2018   HDL 62.50 12/11/2018   LDLCALC 59 12/11/2018   ALT 17 12/11/2018   AST 18  12/11/2018   NA 141 12/11/2018   K 4.7 12/11/2018   CL 104 12/11/2018   CREATININE 0.98 12/11/2018   BUN 19 12/11/2018   CO2 31 12/11/2018   TSH 3.78 12/11/2018   PSA 0.60 12/11/2018   HGBA1C 5.6 12/11/2018    Assessment & Plan:   Gregory was seen today for annual exam, hypertension, coronary artery disease and hyperlipidemia.  Diagnoses and all orders for this visit:  Dilated  aortic root (HCC)  CAD in native artery- He is free from angina.  Will work on risk factor modifications. -     Lipid panel; Future -     Azilsartan Medoxomil (EDARBI) 80 MG TABS; Take 1 tablet (80 mg total) by mouth daily.  Acquired hypothyroidism- His TSH is in the normal range.  Thyroid replacement therapy is not indicated. -     CBC with Differential/Platelet; Future -     TSH; Future  Routine general medical examination at a health care facility  Need for hepatitis C screening test -     Hepatitis C antibody; Future  Ascending aorta dilatation (HCC)- An MRI done of this 4 months ago showed that it was stable and does not require surgical treatment. -     Cancel: CT ANGIO CHEST AORTA W/CM &/OR WO/CM; Future  Hyperglycemia- His blood sugars are very mildly elevated.  He will work on his lifestyle modifications. -     Comprehensive metabolic panel; Future -     Hemoglobin A1c; Future  Hyperlipidemia LDL goal <70- He has achieved his LDL goal and is doing well on the statin. -     Lipid panel; Future -     TSH; Future  Benign prostatic hyperplasia without lower urinary tract symptoms- His PSA is low which is reassuring that he has dose not have prostate cancer. There are no sx's that need to be treated. -     PSA; Future  Thoracic aortic aneurysm without rupture (HCC) -     Cancel: CT ANGIO CHEST AORTA W/CM &/OR WO/CM; Future -     Azilsartan Medoxomil (EDARBI) 80 MG TABS; Take 1 tablet (80 mg total) by mouth daily.  Primary osteoarthritis of both knees -     celecoxib (CELEBREX) 100 MG  capsule; Take 1 capsule (100 mg total) by mouth daily.  Essential hypertension- His BP is not adequately well controlled. I have asked him to upgrade to a more potent ARB. -     Azilsartan Medoxomil (EDARBI) 80 MG TABS; Take 1 tablet (80 mg total) by mouth daily.  Abdominal bruit- Will screen for AAA with an U/S. -     VAS [place] AAA DUPLEX; Future  Need for Tdap vaccination -     Tdap vaccine greater than or equal to 7yo IM   I have discontinued Broadus John Durbin's losartan. I am also having him start on celecoxib and Azilsartan Medoxomil. Additionally, I am having him maintain his aspirin, nitroGLYCERIN, clopidogrel, atorvastatin, and metoprolol tartrate.  Meds ordered this encounter  Medications  . celecoxib (CELEBREX) 100 MG capsule    Sig: Take 1 capsule (100 mg total) by mouth daily.    Dispense:  90 capsule    Refill:  1  . Azilsartan Medoxomil (EDARBI) 80 MG TABS    Sig: Take 1 tablet (80 mg total) by mouth daily.    Dispense:  90 tablet    Refill:  1     Follow-up: Return in about 3 months (around 03/13/2019).  Scarlette Calico, MD

## 2018-12-11 NOTE — Patient Instructions (Signed)

## 2018-12-12 ENCOUNTER — Encounter: Payer: Self-pay | Admitting: Internal Medicine

## 2018-12-12 LAB — HEPATITIS C ANTIBODY
Hepatitis C Ab: NONREACTIVE
SIGNAL TO CUT-OFF: 0.01

## 2018-12-12 NOTE — Assessment & Plan Note (Signed)

## 2018-12-13 ENCOUNTER — Other Ambulatory Visit: Payer: Self-pay | Admitting: Cardiology

## 2018-12-16 ENCOUNTER — Encounter: Payer: Self-pay | Admitting: Internal Medicine

## 2018-12-17 ENCOUNTER — Encounter: Payer: Medicare Other | Admitting: Family Medicine

## 2018-12-17 ENCOUNTER — Other Ambulatory Visit: Payer: Self-pay | Admitting: Internal Medicine

## 2018-12-17 DIAGNOSIS — I10 Essential (primary) hypertension: Secondary | ICD-10-CM

## 2018-12-17 MED ORDER — IRBESARTAN 150 MG PO TABS: 150.0000 mg | ORAL_TABLET | Freq: Every day | ORAL | 1 refills | Status: DC

## 2019-01-10 ENCOUNTER — Other Ambulatory Visit (HOSPITAL_COMMUNITY): Payer: Medicare Other

## 2019-01-28 ENCOUNTER — Inpatient Hospital Stay (HOSPITAL_COMMUNITY): Admission: RE | Admit: 2019-01-28 | Payer: Medicare Other | Source: Ambulatory Visit

## 2019-02-04 ENCOUNTER — Ambulatory Visit: Payer: Medicare Other | Admitting: Cardiovascular Disease

## 2019-02-05 ENCOUNTER — Encounter: Payer: Self-pay | Admitting: Internal Medicine

## 2019-02-05 NOTE — Telephone Encounter (Signed)
Pt has been rescheduled and he is aware

## 2019-03-14 ENCOUNTER — Other Ambulatory Visit: Payer: Self-pay | Admitting: Cardiovascular Disease

## 2019-03-18 ENCOUNTER — Other Ambulatory Visit: Payer: Self-pay

## 2019-03-18 ENCOUNTER — Encounter: Payer: Self-pay | Admitting: Internal Medicine

## 2019-03-18 ENCOUNTER — Ambulatory Visit (INDEPENDENT_AMBULATORY_CARE_PROVIDER_SITE_OTHER): Payer: Medicare Other | Admitting: Internal Medicine

## 2019-03-18 VITALS — BP 138/84 | HR 64 | Temp 98.1°F | Resp 16 | Ht 70.0 in | Wt 190.0 lb

## 2019-03-18 DIAGNOSIS — E039 Hypothyroidism, unspecified: Secondary | ICD-10-CM

## 2019-03-18 DIAGNOSIS — I10 Essential (primary) hypertension: Secondary | ICD-10-CM

## 2019-03-18 DIAGNOSIS — Z23 Encounter for immunization: Secondary | ICD-10-CM

## 2019-03-18 DIAGNOSIS — I2510 Atherosclerotic heart disease of native coronary artery without angina pectoris: Secondary | ICD-10-CM | POA: Diagnosis not present

## 2019-03-18 DIAGNOSIS — I77810 Thoracic aortic ectasia: Secondary | ICD-10-CM

## 2019-03-18 NOTE — Progress Notes (Signed)
Subjective:  Patient ID: Gregory Hale, male    DOB: March 18, 1951  Age: 68 y.o. MRN: [id number]  CC: Hypertension   HPI Gregory Hale presents for f/up - He tells me his blood pressure has been well controlled.  He continues to be active on the golf course and denies any recent episodes of CP, DOE, palpitations, edema, fatigue, dizziness, or lightheadedness.  Outpatient Medications Prior to Visit  Medication Sig Dispense Refill  . aspirin 81 MG chewable tablet Chew 1 tablet (81 mg total) by mouth daily. 30 tablet   . atorvastatin (LIPITOR) 80 MG tablet TAKE 1 TABLET (80 MG TOTAL) BY MOUTH DAILY AT 6 PM. 90 tablet 0  . celecoxib (CELEBREX) 100 MG capsule Take 1 capsule (100 mg total) by mouth daily. 90 capsule 1  . clopidogrel (PLAVIX) 75 MG tablet Take 1 tablet (75 mg total) by mouth daily. 90 tablet 3  . irbesartan (AVAPRO) 150 MG tablet Take 1 tablet (150 mg total) by mouth daily. 90 tablet 1  . metoprolol tartrate (LOPRESSOR) 25 MG tablet TAKE 1/2 TABLET BY MOUTH TWO TIMES A DAY 90 tablet 0  . nitroGLYCERIN (NITROSTAT) 0.4 MG SL tablet Place 1 tablet (0.4 mg total) under the tongue every 5 (five) minutes as needed. 25 tablet 3   No facility-administered medications prior to visit.     ROS Review of Systems  Constitutional: Negative for diaphoresis, fatigue and unexpected weight change.  HENT: Negative.   Eyes: Negative for visual disturbance.  Respiratory: Negative for cough, chest tightness, shortness of breath and wheezing.   Cardiovascular: Negative for chest pain, palpitations and leg swelling.  Gastrointestinal: Negative for abdominal pain, diarrhea, nausea and vomiting.  Endocrine: Negative for cold intolerance and heat intolerance.  Genitourinary: Negative.  Negative for difficulty urinating and dysuria.  Musculoskeletal: Positive for arthralgias. Negative for back pain and myalgias.  Skin: Negative.  Negative for color change.  Neurological: Negative.  Negative for  dizziness, weakness and light-headedness.  Hematological: Negative for adenopathy. Does not bruise/bleed easily.  Psychiatric/Behavioral: Negative.     Objective:  BP 138/84 (BP Location: Right Arm, Cuff Size: Normal)   Pulse 64   Temp 98.1 F (36.7 C) (Oral)   Resp 16   Ht 5\' 10"  (1.778 m)   Wt 190 lb (86.2 kg)   SpO2 98%   BMI 27.26 kg/m   BP Readings from Last 3 Encounters:  03/18/19 138/84  12/11/18 (!) 154/84  01/29/18 100/60    Wt Readings from Last 3 Encounters:  03/18/19 190 lb (86.2 kg)  12/11/18 191 lb 8 oz (86.9 kg)  01/29/18 191 lb (86.6 kg)    Physical Exam Vitals signs reviewed.  Constitutional:      Appearance: He is not ill-appearing or diaphoretic.  HENT:     Nose: Nose normal.     Mouth/Throat:     Mouth: Mucous membranes are moist.     Pharynx: No oropharyngeal exudate or posterior oropharyngeal erythema.  Eyes:     Conjunctiva/sclera: Conjunctivae normal.  Neck:     Musculoskeletal: No neck rigidity.  Cardiovascular:     Rate and Rhythm: Normal rate and regular rhythm.     Heart sounds: No murmur. No gallop.   Pulmonary:     Effort: Pulmonary effort is normal.     Breath sounds: No stridor. No wheezing, rhonchi or rales.  Abdominal:     General: Bowel sounds are normal.     Palpations: There is no hepatomegaly, splenomegaly or mass.  Tenderness: There is no abdominal tenderness.  Musculoskeletal:     Right lower leg: No edema.  Lymphadenopathy:     Cervical: No cervical adenopathy.  Skin:    General: Skin is warm and dry.  Neurological:     General: No focal deficit present.  Psychiatric:        Mood and Affect: Mood normal.        Behavior: Behavior normal.     Lab Results  Component Value Date   WBC 7.4 12/11/2018   HGB 15.7 12/11/2018   HCT 46.3 12/11/2018   PLT 156.0 12/11/2018   GLUCOSE 111 (H) 12/11/2018   CHOL 128 12/11/2018   TRIG 31.0 12/11/2018   HDL 62.50 12/11/2018   LDLCALC 59 12/11/2018   ALT 17  12/11/2018   AST 18 12/11/2018   NA 141 12/11/2018   K 4.7 12/11/2018   CL 104 12/11/2018   CREATININE 0.98 12/11/2018   BUN 19 12/11/2018   CO2 31 12/11/2018   TSH 3.78 12/11/2018   PSA 0.60 12/11/2018   HGBA1C 5.6 12/11/2018    Mr Angiogram Chest W Wo Contrast  Result Date: 08/06/2018 CLINICAL DATA:  Aortic aneurysm EXAM: MRA CHEST WITH CONTRAST TECHNIQUE: Multiplanar, multiecho pulse sequences of the chest were obtained with intravenous contrast. Angiographic images of chest were obtained using MRA technique with intravenous contrast. CONTRAST:  10 cc Gadavist COMPARISON:  08/10/2017 FINDINGS: Maximal diameter of the ascending aorta at the sinus of Valsalva, sino-tubular junction, and ascending aorta are 4.3 cm, 3.1 cm, and 4.1 cm. Previously, maximal diameter of the ascending aorta was 4.0 cm. There is no evidence of aortic dissection or acute intramural hematoma. Great vessels are patent. Vertebral arteries are diminutive but grossly patent. There is no obvious pulmonary thromboembolism. There is no evidence of mediastinal mass effect or pericardial effusion. Lungs are grossly clear other than minimal dependent atelectasis. Small cysts in the liver are not significantly changed. IMPRESSION: Maximal diameter of the aorta is not significantly changed at 4.1 cm. Ascending thoracic aortic aneurysm. Recommend semi-annual imaging followup by CTA or MRA and referral to cardiothoracic surgery if not already obtained. This recommendation follows 2010 ACCF/AHA/AATS/ACR/ASA/SCA/SCAI/SIR/STS/SVM Guidelines for the Diagnosis and Management of Patients With Thoracic Aortic Disease. Circulation. 2010; 121: B939-Q300 Electronically Signed   By: Marybelle Killings M.D.   On: 08/06/2018 12:35    Assessment & Plan:   Gregory Hale was seen today for hypertension.  Diagnoses and all orders for this visit:  Need for pneumococcal vaccination -     Pneumococcal polysaccharide vaccine 23-valent greater than or equal to  2yo subcutaneous/IM  Essential hypertension- His blood pressure is adequately well controlled.  Will continue the combination of metoprolol and irbesartan.  Acquired hypothyroidism- His recent TSH was in the normal range and clinically he appears euthyroid.  TRT is not indicated.   I am having Gregory Hale maintain his aspirin, nitroGLYCERIN, clopidogrel, celecoxib, atorvastatin, irbesartan, and metoprolol tartrate.  No orders of the defined types were placed in this encounter.    Follow-up: Return in about 6 months (around 09/17/2019).  Scarlette Calico, MD

## 2019-03-18 NOTE — Patient Instructions (Signed)

## 2019-03-27 ENCOUNTER — Telehealth: Payer: Self-pay | Admitting: Cardiovascular Disease

## 2019-03-27 NOTE — Telephone Encounter (Signed)
New Message     Left voicemail to confirm appt and answer COVID Questions

## 2019-03-27 NOTE — Telephone Encounter (Signed)

## 2019-03-28 ENCOUNTER — Other Ambulatory Visit: Payer: Self-pay

## 2019-03-28 ENCOUNTER — Encounter: Payer: Self-pay | Admitting: Cardiovascular Disease

## 2019-03-28 ENCOUNTER — Ambulatory Visit (INDEPENDENT_AMBULATORY_CARE_PROVIDER_SITE_OTHER): Payer: Medicare Other | Admitting: Cardiovascular Disease

## 2019-03-28 VITALS — BP 128/70 | HR 67 | Ht 70.0 in | Wt 190.0 lb

## 2019-03-28 DIAGNOSIS — I2510 Atherosclerotic heart disease of native coronary artery without angina pectoris: Secondary | ICD-10-CM

## 2019-03-28 DIAGNOSIS — I77810 Thoracic aortic ectasia: Secondary | ICD-10-CM | POA: Diagnosis not present

## 2019-03-28 NOTE — Progress Notes (Signed)
Chief Complaint  Patient presents with  . Follow-up    CAD   History of Present Illness: 68 yo male with history of CAD, former tobacco abuse, OA and thoracic aortic aneurysm here today for cardiac follow up. He was admitted to [facility] 04/22/17 with an acute inferior STEMI. His RCA was occluded and was treated with a drug eluting stent. There was mild disease noted in the LAD and moderate disease in the Diagonal branch. LVEF=65%. Echo 04/23/17 with normal LV systolic function, WUXL=24-40%, no valve disease, mild dilation of aortic root. He did well following his MI. Chest CTA November 2018 with 4.3 cm ascending aortic aneurysm. Chest MRA November 2019 with 4.1 cm ascending aortic aneurysm.   He is here today for follow up. The patient denies any chest pain, dyspnea, palpitations, lower extremity edema, orthopnea, PND, dizziness, near syncope or syncope.   Primary Care Physician: Janith Lima, MD  Past Medical History:  Diagnosis Date  . Arthritis   . Ascending aorta dilatation (HCC)   . Bradycardia    a. HR 40 in setting of acute inferior STEMI 03/2017.  Marland Kitchen CAD in native artery    a. inf STEMI 03/2017 a/w hypotension and bradycardia -> s/p DES to RCA, otherwise mild disease in LAD, moderate disease in ostium of the moderate caliber diagonal branch, normal LVEF >65%  . Dilated aortic root ([place])   . STEMI involving right coronary artery ([place])    04/22/17 PCI/DES to mRCA, normal EF  . Tobacco abuse    Quit in 1998    Past Surgical History:  Procedure Laterality Date  . CORONARY STENT INTERVENTION N/A 04/22/2017   Procedure: Coronary Stent Intervention;  Surgeon: [name], MD;  Location: [place] [facility];  Service: Cardiovascular;  Laterality: N/A;  . LEFT HEART CATH AND CORONARY ANGIOGRAPHY N/A 04/22/2017   Procedure: Left Heart Cath and Coronary Angiography;  Surgeon: [name], MD;  Location: [place] [facility];  Service: Cardiovascular;  Laterality: N/A;  .  parotid Left 1980  . TONSILLECTOMY     as a child    Current Outpatient Medications  Medication Sig Dispense Refill  . atorvastatin (LIPITOR) 80 MG tablet TAKE 1 TABLET (80 MG TOTAL) BY MOUTH DAILY AT 6 PM. 90 tablet 0  . celecoxib (CELEBREX) 100 MG capsule Take 1 capsule (100 mg total) by mouth daily. 90 capsule 1  . clopidogrel (PLAVIX) 75 MG tablet Take 1 tablet (75 mg total) by mouth daily. 90 tablet 3  . irbesartan (AVAPRO) 150 MG tablet Take 1 tablet (150 mg total) by mouth daily. 90 tablet 1  . metoprolol tartrate (LOPRESSOR) 25 MG tablet TAKE 1/2 TABLET BY MOUTH TWO TIMES A DAY 90 tablet 0  . nitroGLYCERIN (NITROSTAT) 0.4 MG SL tablet Place 1 tablet (0.4 mg total) under the tongue every 5 (five) minutes as needed. 25 tablet 3   No current facility-administered medications for this visit.     Allergies  Allergen Reactions  . Advil [Ibuprofen] Other (See Comments)    bleeding    Social History   Socioeconomic History  . Marital status: Married    Spouse name: Not on file  . Number of children: Not on file  . Years of education: Not on file  . Highest education level: Not on file  Occupational History  . Not on file  Social Needs  . Financial resource strain: Not on file  . Food insecurity    Worry: Not on file  Inability: Not on file  . Transportation needs    Medical: Not on file    Non-medical: Not on file  Tobacco Use  . Smoking status: Former Research scientist (life sciences)  . Smokeless tobacco: Never Used  Substance and Sexual Activity  . Alcohol use: Yes    Alcohol/week: 10.0 standard drinks    Types: 10 Cans of beer per week  . Drug use: No  . Sexual activity: Yes    Partners: Female  Lifestyle  . Physical activity    Days per week: Not on file    Minutes per session: Not on file  . Stress: Not on file  Relationships  . Social Herbalist on phone: Not on file    Gets together: Not on file    Attends religious service: Not on file    Active member of club  or organization: Not on file    Attends meetings of clubs or organizations: Not on file    Relationship status: Not on file  . Intimate partner violence    Fear of current or ex partner: Not on file    Emotionally abused: Not on file    Physically abused: Not on file    Forced sexual activity: Not on file  Other Topics Concern  . Not on file  Social History Narrative  . Not on file    Family History  Problem Relation Age of Onset  . CAD Brother     Review of Systems:  As stated in the HPI and otherwise negative.   BP 128/70   Pulse 67   Ht 5\' 10"  (1.778 m)   Wt 190 lb (86.2 kg)   SpO2 99%   BMI 27.26 kg/m   Physical Examination:  General: Well developed, well nourished, NAD  HEENT: OP clear, mucus membranes moist  SKIN: warm, dry. No rashes. Neuro: No focal deficits  Musculoskeletal: Muscle strength 5/5 all ext  Psychiatric: Mood and affect normal  Neck: No JVD, no carotid bruits, no thyromegaly, no lymphadenopathy.  Lungs:Clear bilaterally, no wheezes, rhonci, crackles Cardiovascular: Regular rate and rhythm. No murmurs, gallops or rubs. Abdomen:Soft. Bowel sounds present. Non-tender.  Extremities: No lower extremity edema. Pulses are 2 + in the bilateral DP/PT.  Echo 04/23/17: - Left ventricle: The cavity size was normal. Wall thickness was   increased in a pattern of mild LVH. Systolic function was normal.   The estimated ejection fraction was in the range of 60% to 65%.   Wall motion was normal; there were no regional wall motion   abnormalities. Doppler parameters are consistent with abnormal   left ventricular relaxation (grade 1 diastolic dysfunction). - Aortic root: The aortic root was mildly dilated. - Ascending aorta: The ascending aorta was mildly dilated. - Mitral valve: There was trivial regurgitation. - Right atrium: Central venous pressure (est): 3 mm Hg. - Atrial septum: No defect or patent foramen ovale was identified. - Tricuspid valve: There  was mild regurgitation. - Pulmonary arteries: PA peak pressure: 24 mm Hg (S). - Pericardium, extracardiac: There was no pericardial effusion.  Impressions:  - Mild LVH with LVEF 60-65% and grade 1 diastolic dysfunction.   Trivial mitral regurgitation. Mildly dilated aortic root and   ascending aorta. Mild tricuspid regurgitation with PASP 24 mmHg.  Cardiac cath 04/22/17:  Mid LAD lesion, 30 %stenosed.  Ost 2nd Diag lesion, 70 %stenosed.  A STENT RESOLUTE ONYX 5.0X18 drug eluting stent was successfully placed.  Prox RCA lesion, 100 %stenosed.  Post intervention,  there is a 0% residual stenosis.  The left ventricular systolic function is normal.  LV end diastolic pressure is normal.  The left ventricular ejection fraction is greater than 65% by visual estimate.  There is no mitral valve regurgitation.  Diagnostic Diagram       Post-Intervention Diagram          EKG:  EKG is  ordered today. The ekg ordered today demonstrates . NSR  Recent Labs: 12/11/2018: ALT 17; BUN 19; Creatinine, Ser 0.98; Hemoglobin 15.7; Platelets 156.0; Potassium 4.7; Sodium 141; TSH 3.78   Lipid Panel    Component Value Date/Time   CHOL 128 12/11/2018 0854   CHOL 106 05/22/2017 0814   CHOL 179 06/24/2011   TRIG 31.0 12/11/2018 0854   TRIG 63 06/24/2011   HDL 62.50 12/11/2018 0854   HDL 53 05/22/2017 0814   CHOLHDL 2 12/11/2018 0854   VLDL 6.2 12/11/2018 0854   LDLCALC 59 12/11/2018 0854   LDLCALC 45 05/22/2017 0814     Wt Readings from Last 3 Encounters:  03/28/19 190 lb (86.2 kg)  03/18/19 190 lb (86.2 kg)  12/11/18 191 lb 8 oz (86.9 kg)     Other studies Reviewed: Additional studies/ records that were reviewed today include: . Review of the above records demonstrates:    Assessment and Plan:   1. CAD without angina: No chest pain. Continue Plavix, statin and beta blocker.  Will stop ASA due to easy bruising.   2. Thoracic aortic aneurysm: Chest MRA November 2019  stable 4.1 cm ascending aortic aneurysm.  Will plan MRA of the chest/abdomen/pelvis in November 2020 to evaluate his entire aorta.   Current medicines are reviewed at length with the patient today.  The patient does not have concerns regarding medicines.  The following changes have been made:  no change  Labs/ tests ordered today include:   Orders Placed This Encounter  Procedures  . EKG 12-Lead     Disposition:   FU with me in 12 months   Signed, Lauree Chandler, MD 03/28/2019 12:35 PM    [facility] [place], [place], [place]  [address] Phone: [phone]; Fax: [phone]

## 2019-03-28 NOTE — Patient Instructions (Signed)
Medication Instructions:  1) STOP ASPIRIN  Testing/Procedures: Dr. Angelena Form recommends you have a CHEST MRA in November.  Follow-Up: Your provider wants you to follow-up in: 1 year with Dr. Angelena Form. You will receive a reminder letter in the mail two months in advance. If you don't receive a letter, please call our office to schedule the follow-up appointment.

## 2019-04-01 ENCOUNTER — Other Ambulatory Visit: Payer: Self-pay | Admitting: Cardiovascular Disease

## 2019-04-01 ENCOUNTER — Other Ambulatory Visit: Payer: Self-pay | Admitting: Cardiology

## 2019-04-01 ENCOUNTER — Ambulatory Visit (HOSPITAL_COMMUNITY)
Admission: RE | Admit: 2019-04-01 | Payer: Medicare Other | Source: Ambulatory Visit | Attending: Internal Medicine | Admitting: Internal Medicine

## 2019-05-30 ENCOUNTER — Other Ambulatory Visit: Payer: Self-pay | Admitting: Internal Medicine

## 2019-05-30 DIAGNOSIS — I10 Essential (primary) hypertension: Secondary | ICD-10-CM

## 2019-05-31 ENCOUNTER — Other Ambulatory Visit: Payer: Self-pay | Admitting: Internal Medicine

## 2019-05-31 DIAGNOSIS — M170 Bilateral primary osteoarthritis of knee: Secondary | ICD-10-CM

## 2019-05-31 MED ORDER — CELECOXIB 100 MG PO CAPS: 100.0000 mg | ORAL_CAPSULE | Freq: Every day | ORAL | 1 refills | Status: DC

## 2019-06-06 DIAGNOSIS — Z23 Encounter for immunization: Secondary | ICD-10-CM | POA: Diagnosis not present

## 2019-06-25 ENCOUNTER — Other Ambulatory Visit: Payer: Self-pay | Admitting: Cardiovascular Disease

## 2019-07-09 DIAGNOSIS — H52223 Regular astigmatism, bilateral: Secondary | ICD-10-CM | POA: Diagnosis not present

## 2019-07-09 DIAGNOSIS — H353131 Nonexudative age-related macular degeneration, bilateral, early dry stage: Secondary | ICD-10-CM | POA: Diagnosis not present

## 2019-07-09 DIAGNOSIS — H40023 Open angle with borderline findings, high risk, bilateral: Secondary | ICD-10-CM | POA: Diagnosis not present

## 2019-07-09 DIAGNOSIS — H2513 Age-related nuclear cataract, bilateral: Secondary | ICD-10-CM | POA: Diagnosis not present

## 2019-07-09 DIAGNOSIS — H43811 Vitreous degeneration, right eye: Secondary | ICD-10-CM | POA: Diagnosis not present

## 2019-07-09 DIAGNOSIS — H5203 Hypermetropia, bilateral: Secondary | ICD-10-CM | POA: Diagnosis not present

## 2019-07-09 DIAGNOSIS — H524 Presbyopia: Secondary | ICD-10-CM | POA: Diagnosis not present

## 2019-08-06 DIAGNOSIS — N5201 Erectile dysfunction due to arterial insufficiency: Secondary | ICD-10-CM | POA: Diagnosis not present

## 2019-08-07 ENCOUNTER — Ambulatory Visit (HOSPITAL_COMMUNITY)
Admission: RE | Admit: 2019-08-07 | Discharge: 2019-08-07 | Disposition: A | Discharge status: Home / Self Care | Payer: Medicare Other | Source: Ambulatory Visit | Attending: Cardiovascular Disease | Admitting: Cardiovascular Disease

## 2019-08-07 ENCOUNTER — Other Ambulatory Visit: Payer: Self-pay

## 2019-08-07 DIAGNOSIS — I712 Thoracic aortic aneurysm, without rupture, unspecified: Secondary | ICD-10-CM

## 2019-08-07 LAB — CREATININE, SERUM
Creatinine, Ser: 1.02 mg/dL (ref 0.61–1.24)
GFR calc Af Amer: >60 mL/min (ref >60)
GFR calc non Af Amer: >60 mL/min (ref >60)

## 2019-08-07 MED ORDER — GADOBUTROL 1 MMOL/ML IV SOLN
10.0000 mL | Freq: Once | INTRAVENOUS | Status: AC | PRN
  Administered 2019-08-07: 10 mL via INTRAVENOUS

## 2019-08-08 ENCOUNTER — Telehealth: Payer: Self-pay

## 2019-08-08 DIAGNOSIS — I712 Thoracic aortic aneurysm, without rupture, unspecified: Secondary | ICD-10-CM

## 2019-08-08 DIAGNOSIS — I2510 Atherosclerotic heart disease of native coronary artery without angina pectoris: Secondary | ICD-10-CM

## 2019-08-08 NOTE — Telephone Encounter (Signed)
Pt verbalized understanding of his MRA results... pt has asked of the study looked at chest/ abdomen/ pelvis to view his entire aorta that was suggested by his PCP Dr. Ronnald Ramp... pt was not sure exactly what Dr. Ronnald Ramp was looking/ asking for but was wanting to ask Dr. Angelena Form had been able to see what they had dicussed at his last OV 03/2019.   Will forward to Dr. Angelena Form for review.

## 2019-08-08 NOTE — Telephone Encounter (Signed)
-----   Message from [name], MD sent at 08/07/2019  4:04 PM EST ----- Stable thoracic aortic aneurysm. Can we let him know? Thanks, chris

## 2019-08-09 NOTE — Telephone Encounter (Signed)
This study did not look at the abdominal aorta. This was only looking at the aorta above the diaphragm. Gerald Stabs

## 2019-08-09 NOTE — Telephone Encounter (Signed)
Called and informed pt that the MRA that was done was only of the chest. He cancelled  AAA duplex ordered by Dr. Ronnald Ramp (PCP) in July because he thought the MRA was going to be of chest/abd/pelvis.  According to last ov note from Dr. Angelena Form:  2. Thoracic aortic aneurysm: Chest MRA November 2019 stable 4.1 cm ascending aortic aneurysm.  Will plan MRA of the chest/abdomen/pelvis in November 2020 to evaluate his entire aorta.   Will route back to Dr. Angelena Form for recommendations.

## 2019-08-09 NOTE — Telephone Encounter (Signed)
It looks like the MRA was ordered wrong. Can we let him know that it was our mistake? We can arrange the abdominal aortic u/s for him to assess the distal aorta. I did not look back at my note just now when responding. Thanks, chris

## 2019-08-12 NOTE — Telephone Encounter (Signed)
Spoke with pt.  Informed that MRA was only ordered of chest mistakenly instead of chest/abd/pelvis as was discussed with Dr. Angelena Form at last ov.  Pt aware Dr. Angelena Form is ordering AAA duplex to assess the distal aorta. Pt would like for Dr. Ronnald Ramp to stay updated on this so I will forward this information to him.  Pt aware he will be called to schedule ultrasound.

## 2019-08-14 ENCOUNTER — Encounter: Payer: Self-pay | Admitting: Internal Medicine

## 2019-08-20 ENCOUNTER — Other Ambulatory Visit: Payer: Self-pay

## 2019-08-20 ENCOUNTER — Ambulatory Visit (HOSPITAL_COMMUNITY)
Admission: RE | Admit: 2019-08-20 | Discharge: 2019-08-20 | Disposition: A | Discharge status: Home / Self Care | Payer: Medicare Other | Source: Ambulatory Visit | Attending: Cardiology | Admitting: Cardiology

## 2019-08-20 ENCOUNTER — Other Ambulatory Visit (HOSPITAL_COMMUNITY): Payer: Self-pay | Admitting: Cardiovascular Disease

## 2019-08-20 DIAGNOSIS — I712 Thoracic aortic aneurysm, without rupture, unspecified: Secondary | ICD-10-CM

## 2019-08-20 DIAGNOSIS — I714 Abdominal aortic aneurysm, without rupture, unspecified: Secondary | ICD-10-CM

## 2019-08-20 DIAGNOSIS — I2510 Atherosclerotic heart disease of native coronary artery without angina pectoris: Secondary | ICD-10-CM | POA: Insufficient documentation

## 2019-09-03 ENCOUNTER — Telehealth: Payer: Self-pay | Admitting: *Deleted

## 2019-09-03 DIAGNOSIS — I712 Thoracic aortic aneurysm, without rupture, unspecified: Secondary | ICD-10-CM

## 2019-09-03 DIAGNOSIS — I779 Disorder of arteries and arterioles, unspecified: Secondary | ICD-10-CM

## 2019-09-03 NOTE — Telephone Encounter (Signed)
Notified patient previously of results.  Results sent to MyChart. Forwarded to Dr. Ronnald Ramp as well.  Order placed MRA chest/abd/pelvis for next November 2021 to re examin thoracic and abd aorta.

## 2019-09-03 NOTE — Telephone Encounter (Signed)
-----   Message from [name], MD sent at 08/20/2019 11:07 AM EST ----- Mild dilation of the abdominal aorta. Will follow with another imaging study next year. We will plan to scan this at the same time as his thoracic aortic aneurysm next year. Thanks, Gregory Hale

## 2019-09-04 ENCOUNTER — Other Ambulatory Visit: Payer: Self-pay | Admitting: Internal Medicine

## 2019-09-04 DIAGNOSIS — I10 Essential (primary) hypertension: Secondary | ICD-10-CM

## 2019-10-14 DIAGNOSIS — Z23 Encounter for immunization: Secondary | ICD-10-CM | POA: Diagnosis not present

## 2019-11-06 ENCOUNTER — Telehealth: Payer: Self-pay | Admitting: Internal Medicine

## 2019-11-06 DIAGNOSIS — E039 Hypothyroidism, unspecified: Secondary | ICD-10-CM

## 2019-11-06 NOTE — Addendum Note (Signed)
Addended by: Karle Barr on: 11/06/2019 04:11 PM   Modules accepted: Orders

## 2019-11-06 NOTE — Chronic Care Management (AMB) (Signed)
  Chronic Care Management   Note  11/06/2019 Name: Moksh Loomer MRN: [id number] DOB: Sep 28, 1950  [name] is a 69 y.o. year old male who is a primary care patient of Janith Lima, MD. I reached out to [name] by phone today in response to a referral sent by Mr. Arshia Rondon PCP, Janith Lima, MD.   Mr. Brightwell was given information about Chronic Care Management services today including:  1. CCM service includes personalized support from designated clinical staff supervised by his physician, including individualized plan of care and coordination with other care providers 2. 24/7 contact phone numbers for assistance for urgent and routine care needs. 3. Service will only be billed when office clinical staff spend 20 minutes or more in a month to coordinate care. 4. Only one practitioner may furnish and bill the service in a calendar month. 5. The patient may stop CCM services at any time (effective at the end of the month) by phone call to the office staff. 6. The patient will be responsible for cost sharing (co-pay) of up to 20% of the service fee (after annual deductible is met).  Patient agreed to services and verbal consent obtained.   Follow up plan:   Raynicia Dukes [employer] Scheduler

## 2019-11-08 ENCOUNTER — Ambulatory Visit: Payer: Medicare Other | Admitting: Pharmacist

## 2019-11-08 DIAGNOSIS — M170 Bilateral primary osteoarthritis of knee: Secondary | ICD-10-CM

## 2019-11-08 DIAGNOSIS — E785 Hyperlipidemia, unspecified: Secondary | ICD-10-CM

## 2019-11-08 DIAGNOSIS — I10 Essential (primary) hypertension: Secondary | ICD-10-CM

## 2019-11-08 DIAGNOSIS — I2510 Atherosclerotic heart disease of native coronary artery without angina pectoris: Secondary | ICD-10-CM

## 2019-11-08 DIAGNOSIS — Z23 Encounter for immunization: Secondary | ICD-10-CM | POA: Diagnosis not present

## 2019-11-08 NOTE — Chronic Care Management (AMB) (Signed)
Chronic Care Management Pharmacy  Name: Gregory Hale  MRN: [id number] DOB: March 03, 1951   Chief Complaint/ HPI  Gregory Hale,  69 y.o. , male presents for their Initial CCM visit with the clinical pharmacist via telephone.  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 He endorses good sleep habits and denies complaints today.  Office Visits: 03/18/19 Dr Ronnald Ramp OV: BP well controlled, TSH normal range, continue meds. Gave PPSV-23.   Consult Visit: 03/28/19 Dr Angelena Form (cardiology): STEMI w/ DES 03/2017. Continued Plavix but stopped aspirin due to easy bruising.   08/20/19: Annual MRA for thoracic ascending aorta dilation. No intervention needed.  Medications: Outpatient Encounter Medications as of 11/08/2019  Medication Sig  . atorvastatin (LIPITOR) 80 MG tablet TAKE ONE TABLET BY MOUTH DAILY AT 6PM  . celecoxib (CELEBREX) 100 MG capsule Take 1 capsule (100 mg total) by mouth daily.  . clopidogrel (PLAVIX) 75 MG tablet TAKE ONE TABLET BY MOUTH DAILY  . irbesartan (AVAPRO) 150 MG tablet TAKE ONE TABLET BY MOUTH DAILY  . metoprolol tartrate (LOPRESSOR) 25 MG tablet TAKE 1/2 TABLET BY MOUTH TWO TIMES A DAY  . nitroGLYCERIN (NITROSTAT) 0.4 MG SL tablet Place 1 tablet (0.4 mg total) under the tongue every 5 (five) minutes as needed.   No facility-administered encounter medications on file as of 11/08/2019.     Current Diagnosis/Assessment:  Goals Addressed            This Visit's Progress   . Pharmacy Care Plan       Current Barriers:  . Chronic Disease Management support, education, and care coordination needs related to CAD, HTN, and arthritis  Pharmacist Clinical Goal(s):  Marland Kitchen Maintain BP < 140/80 . Maintain compliance with medications  Interventions: . Comprehensive medication review performed. Marland Kitchen Refill nitroglycerin at least once a  year to keep in date for as needed use  Patient Self Care Activities:  . Patient verbalizes understanding of plan to continue current medications, Self administers medications as prescribed, Calls pharmacy for medication refills, and Calls provider office for new concerns or questions  Initial goal documentation       Hypertension/CAD   Office blood pressures are  BP Readings from Last 3 Encounters:  03/28/19 128/70  03/18/19 138/84  12/11/18 (!) 154/84   Patient has failed these meds in the past: losartan  Patient is currently controlled on the following medications: irbesartan 150 mg daily, metoprolol tartrate 25 mg BID, clopidogrel 75 mg daily, nitroglycerin 0.4 mg SL PRN (never used)  Patient checks BP at home infrequently due to BP always at goal  Patient home BP readings are ranging: 120s/80s-130s/80s  We discussed importance of adherence to medications to prevent a second cardiovascular event. Pt reports 100% adherence. 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  Pt has never had to use nitroglycerin before. He reports his MI presented atypically, he did not have any chest pain and was only dizzy. Discussed mechanism of NTG and when to take it if he feels similar symptoms again. Discussed the need to replace NTG every 6-12 months to ensure it is in date if he needs it. He knows his prescription is very old so he would need a new rx.  Plan  Continue current medications and control with diet and exercise  Refill NTG for prn use  Hyperlipidemia  Lipid Panel     Component Value Date/Time   CHOL 128 12/11/2018 0854   TRIG 31.0 12/11/2018 0854   HDL 62.50 12/11/2018 0854   CHOLHDL 2 12/11/2018 0854   VLDL 6.2 12/11/2018 0854   LDLCALC 59 12/11/2018 0854     Patient has failed these meds in past: n/a Patient is currently controlled on the following medications: atorvastatin 80 mg daily  We discussed: importance of compliance for  secondary prevention. Pt denies side effects.   Plan  Continue current medications    Osteoarthritis   Patient is currently controlled on the following medications: celecoxib 100 mg daily  We discussed: arthritis slows him down but doesn't stop him, he reports celecoxib works well despite some breakthrough pain. He denies OTC medication use. He used to take Advil but doesn't anymore due to increased bleeding risk.  Plan  Continue current medications   Medication Management   Pt uses [facility] for all medications Pt uses weekly pill box Pt uses pill cutter for metoprolol Pt endorses 100% compliance  We discussed: pt recently signed up for auto refills at his pharmacy and he has never had any issues obtaining his meds when he needs them.   Plan  Continue current medications at current pharmacy  Follow up: 6 month phone call   Charlene Brooke, PharmD Clinical Pharmacist [facility] at [facility] [phone]

## 2019-11-08 NOTE — Patient Instructions (Addendum)
Visit Information  Thank you for meeting with me to discuss your medications! I look forward to working with you to achieve your health care goals. Below is a summary of what we talked about during the visit:  Goals Addressed            This Visit's Progress   . Pharmacy Care Plan       Current Barriers:  . Chronic Disease Management support, education, and care coordination needs related to CAD, HTN, and arthritis  Pharmacist Clinical Goal(s):  Marland Kitchen Maintain BP < 140/80 . Maintain compliance with medications  Interventions: . Comprehensive medication review performed. Marland Kitchen Refill nitroglycerin at least once a year to keep in date for as needed use  Patient Self Care Activities:  . Patient verbalizes understanding of plan to continue current medications, Self administers medications as prescribed, Calls pharmacy for medication refills, and Calls provider office for new concerns or questions  Initial goal documentation       Gregory Hale was given information about Chronic Care Management services today including:  1. CCM service includes personalized support from designated clinical staff supervised by his physician, including individualized plan of care and coordination with other care providers 2. 24/7 contact phone numbers for assistance for urgent and routine care needs. 3. Service will only be billed when office clinical staff spend 20 minutes or more in a month to coordinate care. 4. Only one practitioner may furnish and bill the service in a calendar month. 5. The patient may stop CCM services at any time (effective at the end of the month) by phone call to the office staff. 6. The patient will be responsible for cost sharing (co-pay) of up to 20% of the service fee (after annual deductible is met).  Patient agreed to services and verbal consent obtained.   Print copy of patient instructions provided.  Telephone follow up appointment with pharmacy team member scheduled for:  05/11/20 @ 11:30am   Charlene Brooke, PharmD Clinical Pharmacist [facility] at [facility] [phone]  Coronary Artery Disease, Male Coronary artery disease (CAD) is a condition in which the arteries that lead to the heart (coronary arteries) become narrow or blocked. The narrowing or blockage can lead to decreased blood flow to the heart. Prolonged reduced blood flow can cause a heart attack (myocardial infarction or MI). This condition may also be called coronary heart disease. Because CAD is the leading cause of death in men, it is important to understand what causes this condition and how it is treated. What are the causes? CAD is most often caused by atherosclerosis. This is the buildup of fat and cholesterol (plaque) on the inside of the arteries. Over time, the plaque may narrow or block the artery, reducing blood flow to the heart. Plaque can also become weak and break off within a coronary artery and cause a sudden blockage. Other less common causes of CAD include:  A blood clot or a piece of a blood clot or other substance that blocks the flow of blood in a coronary artery (embolism).  A tearing of the artery (spontaneous coronary artery dissection).  An enlargement of an artery (aneurysm).  Inflammation (vasculitis) in the artery wall. What increases the risk? The following factors may make you more likely to develop this condition:  Age. Men over age 60 are at a greater risk of CAD.  Family history of CAD.  Gender. Men often develop CAD earlier in life than women.  High blood pressure (hypertension).  Diabetes.  High cholesterol levels.  Tobacco use.  Excessive alcohol use.  Lack of exercise.  A diet high in saturated and trans fats, such as fried food and processed meat. Other possible risk factors include:  High stress levels.  Depression.  Obesity.  Sleep apnea. What are the signs or symptoms? Many people do not have any symptoms  during the early stages of CAD. As the condition progresses, symptoms may include:  Chest pain (angina). The pain can: ? Feel like crushing or squeezing, or like a tightness, pressure, fullness, or heaviness in the chest. ? Last more than a few minutes or can stop and recur. The pain tends to get worse with exercise or stress and to fade with rest.  Pain in the arms, neck, jaw, ear, or back.  Unexplained heartburn or indigestion.  Shortness of breath.  Nausea or vomiting.  Sudden light-headedness.  Sudden cold sweats.  Fluttering or fast heartbeat (palpitations). How is this diagnosed? This condition is diagnosed based on:  Your family and medical history.  A physical exam.  Tests, including: ? A test to check the electrical signals in your heart (electrocardiogram). ? Exercise stress test. This looks for signs of blockage when the heart is stressed with exercise, such as running on a treadmill. ? Pharmacologic stress test. This test looks for signs of blockage when the heart is being stressed with a medicine. ? Blood tests. ? Coronary angiogram. This is a procedure to look at the coronary arteries to see if there is any blockage. During this test, a dye is injected into your arteries so they appear on an X-ray. ? Coronary artery CT scan. This CT scan helps detect calcium deposits in your coronary arteries. Calcium deposits are an indicator of CAD. ? A test that uses sound waves to take a picture of your heart (echocardiogram). ? Chest X-ray. How is this treated? This condition may be treated by:  Healthy lifestyle changes to reduce risk factors.  Medicines such as: ? Antiplatelet medicines and blood-thinning medicines, such as aspirin. These help to prevent blood clots. ? Nitroglycerin. ? Blood pressure medicines. ? Cholesterol-lowering medicine.  Coronary angioplasty and stenting. During this procedure, a thin, flexible tube is inserted through a blood vessel and into  a blocked artery. A balloon or similar device on the end of the tube is inflated to open up the artery. In some cases, a small, mesh tube (stent) is inserted into the artery to keep it open.  Coronary artery bypass surgery. During this surgery, veins or arteries from other parts of the body are used to create a bypass around the blockage and allow blood to reach your heart. Follow these instructions at home: Medicines  Take over-the-counter and prescription medicines only as told by your health care provider.  Do not take the following medicines unless your health care provider approves: ? NSAIDs, such as ibuprofen, naproxen, or celecoxib. ? Vitamin supplements that contain vitamin A, vitamin E, or both. Lifestyle  Follow an exercise program approved by your health care provider. Aim for 150 minutes of moderate exercise or 75 minutes of vigorous exercise each week.  Maintain a healthy weight or lose weight as approved by your health care provider.  Learn to manage stress or try to limit your stress. Ask your health care provider for suggestions if you need help.  Get screened for depression and seek treatment, if needed.  Do not use any products that contain nicotine or tobacco, such as cigarettes, e-cigarettes, and  chewing tobacco. If you need help quitting, ask your health care provider.  Do not use illegal drugs. Eating and drinking   Follow a heart-healthy diet. A dietitian can help educate you about healthy food options and changes. In general, eat plenty of fruits and vegetables, lean meats, and whole grains.  Avoid foods high in: ? Sugar. ? Salt (sodium). ? Saturated fat, such as processed or fatty meat. ? Trans fat, such as fried foods.  Use healthy cooking methods such as roasting, grilling, broiling, baking, poaching, steaming, or stir-frying.  Do not drink alcohol if your health care provider tells you not to drink.  If you drink alcohol: ? Limit how much you have  to 0-2 drinks per day. ? Be aware of how much alcohol is in your drink. In the [place] one drink equals one 12 oz bottle of beer (355 mL), one 5 oz glass of wine (148 mL), or one 1 oz glass of hard liquor (44 mL). General instructions  Manage any other health conditions, such as hypertension and diabetes. These conditions affect your heart.  Your health care provider may ask you to monitor your blood pressure. Ideally, your blood pressure should be below 130/80.  Keep all follow-up visits as told by your health care provider. This is important. Get help right away if:  You have pain in your chest, neck, ear, arm, jaw, stomach, or back that: ? Lasts more than a few minutes. ? Is recurring. ? Is not relieved by taking medicine under your tongue (sublingual nitroglycerin).  You have profuse sweating without cause.  You have unexplained: ? Heartburn or indigestion. ? Shortness of breath or difficulty breathing. ? Fluttering or fast heartbeat (palpitations). ? Nausea or vomiting. ? Fatigue. ? Feelings of nervousness or anxiety. ? Weakness. ? Diarrhea.  You have sudden light-headedness or dizziness.  You faint.  You feel like hurting yourself or think about taking your own life. These symptoms may represent a serious problem that is an emergency. Do not wait to see if the symptoms will go away. Get medical help right away. Call your local emergency services (911 in the [place] Do not drive yourself to the hospital. Summary  Coronary artery disease (CAD) is a condition in which the arteries that lead to the heart (coronary arteries) become narrow or blocked. The narrowing or blockage can lead to a heart attack.  Many people do not have any symptoms during the early stages of CAD.  CAD can be treated with lifestyle changes, medicines, surgery, or a combination of these treatments. This information is not intended to replace advice given to you by your health care provider. Make sure  you discuss any questions you have with your health care provider. Document Revised: 06/01/2018 Document Reviewed: 05/22/2018 Elsevier Patient Education  2020 [employer].

## 2019-11-11 LAB — HM COLONOSCOPY

## 2019-11-21 ENCOUNTER — Other Ambulatory Visit: Payer: Self-pay | Admitting: Internal Medicine

## 2019-11-21 DIAGNOSIS — M170 Bilateral primary osteoarthritis of knee: Secondary | ICD-10-CM

## 2019-11-22 ENCOUNTER — Other Ambulatory Visit: Payer: Self-pay

## 2019-11-22 MED ORDER — NITROGLYCERIN 0.4 MG SL SUBL: 0.4000 mg | SUBLINGUAL_TABLET | SUBLINGUAL | 3 refills | Status: DC | PRN

## 2019-12-01 ENCOUNTER — Other Ambulatory Visit: Payer: Self-pay | Admitting: Internal Medicine

## 2019-12-01 DIAGNOSIS — I10 Essential (primary) hypertension: Secondary | ICD-10-CM

## 2020-02-15 ENCOUNTER — Other Ambulatory Visit: Payer: Self-pay | Admitting: Internal Medicine

## 2020-02-15 DIAGNOSIS — M170 Bilateral primary osteoarthritis of knee: Secondary | ICD-10-CM

## 2020-02-29 ENCOUNTER — Other Ambulatory Visit: Payer: Self-pay | Admitting: Internal Medicine

## 2020-02-29 DIAGNOSIS — I10 Essential (primary) hypertension: Secondary | ICD-10-CM

## 2020-03-02 ENCOUNTER — Other Ambulatory Visit: Payer: Self-pay

## 2020-03-02 ENCOUNTER — Ambulatory Visit (INDEPENDENT_AMBULATORY_CARE_PROVIDER_SITE_OTHER): Payer: Medicare Other | Admitting: Internal Medicine

## 2020-03-02 ENCOUNTER — Encounter: Payer: Self-pay | Admitting: Internal Medicine

## 2020-03-02 VITALS — BP 138/72 | HR 56 | Temp 98.2°F | Ht 70.0 in | Wt 185.1 lb

## 2020-03-02 DIAGNOSIS — I10 Essential (primary) hypertension: Secondary | ICD-10-CM

## 2020-03-02 DIAGNOSIS — R3912 Poor urinary stream: Secondary | ICD-10-CM

## 2020-03-02 DIAGNOSIS — I2510 Atherosclerotic heart disease of native coronary artery without angina pectoris: Secondary | ICD-10-CM | POA: Diagnosis not present

## 2020-03-02 DIAGNOSIS — R001 Bradycardia, unspecified: Secondary | ICD-10-CM | POA: Diagnosis not present

## 2020-03-02 DIAGNOSIS — E039 Hypothyroidism, unspecified: Secondary | ICD-10-CM

## 2020-03-02 DIAGNOSIS — B360 Pityriasis versicolor: Secondary | ICD-10-CM | POA: Insufficient documentation

## 2020-03-02 DIAGNOSIS — D128 Benign neoplasm of rectum: Secondary | ICD-10-CM | POA: Insufficient documentation

## 2020-03-02 DIAGNOSIS — N401 Enlarged prostate with lower urinary tract symptoms: Secondary | ICD-10-CM | POA: Insufficient documentation

## 2020-03-02 DIAGNOSIS — Z0000 Encounter for general adult medical examination without abnormal findings: Secondary | ICD-10-CM

## 2020-03-02 DIAGNOSIS — T50905A Adverse effect of unspecified drugs, medicaments and biological substances, initial encounter: Secondary | ICD-10-CM

## 2020-03-02 DIAGNOSIS — M8949 Other hypertrophic osteoarthropathy, multiple sites: Secondary | ICD-10-CM | POA: Diagnosis not present

## 2020-03-02 DIAGNOSIS — M159 Polyosteoarthritis, unspecified: Secondary | ICD-10-CM

## 2020-03-02 LAB — BASIC METABOLIC PANEL
BUN: 19 mg/dL (ref 6–23)
CO2: 30 mEq/L (ref 19–32)
Calcium: 9 mg/dL (ref 8.4–10.5)
Chloride: 102 mEq/L (ref 96–112)
Creatinine, Ser: 1.02 mg/dL (ref 0.40–1.50)
GFR: 72.66 mL/min (ref >60.00)
Glucose, Bld: 119 mg/dL — ABNORMAL HIGH (ref 70–99)
Potassium: 5 mEq/L (ref 3.5–5.1)
Sodium: 137 mEq/L (ref 135–145)

## 2020-03-02 LAB — LIPID PANEL
Cholesterol: 117 mg/dL (ref 0–200)
HDL: 58.1 mg/dL (ref >39.00)
LDL Cholesterol: 52 mg/dL (ref 0–99)
NonHDL: 59.39
Total CHOL/HDL Ratio: 2
Triglycerides: 38 mg/dL (ref 0.0–149.0)
VLDL: 7.6 mg/dL (ref 0.0–40.0)

## 2020-03-02 LAB — CBC WITH DIFFERENTIAL/PLATELET
Basophils Absolute: 0 10*3/uL (ref 0.0–0.1)
Basophils Relative: 0.4 % (ref 0.0–3.0)
Eosinophils Absolute: 0.2 10*3/uL (ref 0.0–0.7)
Eosinophils Relative: 2.2 % (ref 0.0–5.0)
HCT: 45.5 % (ref 39.0–52.0)
Hemoglobin: 15.2 g/dL (ref 13.0–17.0)
Lymphocytes Relative: 20.9 % (ref 12.0–46.0)
Lymphs Abs: 1.7 10*3/uL (ref 0.7–4.0)
MCHC: 33.3 g/dL (ref 30.0–36.0)
MCV: 94.2 fl (ref 78.0–100.0)
Monocytes Absolute: 0.7 10*3/uL (ref 0.1–1.0)
Monocytes Relative: 8.1 % (ref 3.0–12.0)
Neutro Abs: 5.6 10*3/uL (ref 1.4–7.7)
Neutrophils Relative %: 68.4 % (ref 43.0–77.0)
Platelets: 155 10*3/uL (ref 150.0–400.0)
RBC: 4.83 Mil/uL (ref 4.22–5.81)
RDW: 14.2 % (ref 11.5–15.5)
WBC: 8.2 10*3/uL (ref 4.0–10.5)

## 2020-03-02 LAB — URINALYSIS, ROUTINE W REFLEX MICROSCOPIC
Bilirubin Urine: NEGATIVE
Hgb urine dipstick: NEGATIVE
Ketones, ur: NEGATIVE
Leukocytes,Ua: NEGATIVE
Nitrite: NEGATIVE
Specific Gravity, Urine: 1.015 (ref 1.000–1.030)
Total Protein, Urine: NEGATIVE
Urine Glucose: NEGATIVE
Urobilinogen, UA: 1 (ref 0.0–1.0)
pH: 7.5 (ref 5.0–8.0)

## 2020-03-02 LAB — HEPATIC FUNCTION PANEL
ALT: 18 U/L (ref 0–53)
AST: 21 U/L (ref 0–37)
Albumin: 4.2 g/dL (ref 3.5–5.2)
Alkaline Phosphatase: 75 U/L (ref 39–117)
Bilirubin, Direct: 0.3 mg/dL (ref 0.0–0.3)
Total Bilirubin: 1.4 mg/dL — ABNORMAL HIGH (ref 0.2–1.2)
Total Protein: 6.3 g/dL (ref 6.0–8.3)

## 2020-03-02 LAB — PSA: PSA: 0.49 ng/mL (ref 0.10–4.00)

## 2020-03-02 LAB — TSH: TSH: 3.02 u[IU]/mL (ref 0.35–4.50)

## 2020-03-02 MED ORDER — FLUCONAZOLE 100 MG PO TABS: 400.0000 mg | ORAL_TABLET | Freq: Once | ORAL | 0 refills | Status: AC

## 2020-03-02 MED ORDER — IRBESARTAN 150 MG PO TABS: 150.0000 mg | ORAL_TABLET | Freq: Every day | ORAL | 1 refills | Status: DC

## 2020-03-02 MED ORDER — CELECOXIB 200 MG PO CAPS: 200.0000 mg | ORAL_CAPSULE | Freq: Every day | ORAL | 1 refills | Status: DC

## 2020-03-02 MED ORDER — METOPROLOL SUCCINATE ER 25 MG PO TB24: 12.5000 mg | ORAL_TABLET | Freq: Every day | ORAL | 1 refills | Status: DC

## 2020-03-02 NOTE — Progress Notes (Signed)
Subjective:  Patient ID: Gregory Hale, male    DOB: 1950-10-04  Age: 69 y.o. MRN: [id number]  CC: Annual Exam, Hypertension, Hyperlipidemia, Coronary Artery Disease, and Rash  This visit occurred during the SARS-CoV-2 public health emergency.  Safety protocols were in place, including screening questions prior to the visit, additional usage of staff PPE, and extensive cleaning of exam room while observing appropriate contact time as indicated for disinfecting solutions.    HPI Lamount Bankson presents for a CPX.  1. Asx rash on his back.  2.  He is very active.  He takes long walks and plays golf 4-5 times a week.  He denies any recent episodes of dizziness, lightheadedness, palpitations, near syncope, CP, DOE, edema, or fatigue.  3.  He continues to complain of pain in his large joints and wants to know if he can increase the dose of celecoxib.  4.  He is tolerating the statin well with no muscle aches.  Outpatient Medications Prior to Visit  Medication Sig Dispense Refill  . atorvastatin (LIPITOR) 80 MG tablet TAKE ONE TABLET BY MOUTH DAILY AT 6PM 90 tablet 3  . clopidogrel (PLAVIX) 75 MG tablet TAKE ONE TABLET BY MOUTH DAILY 90 tablet 3  . celecoxib (CELEBREX) 100 MG capsule TAKE ONE CAPSULE BY MOUTH DAILY 90 capsule 0  . irbesartan (AVAPRO) 150 MG tablet TAKE ONE TABLET BY MOUTH DAILY 90 tablet 0  . metoprolol tartrate (LOPRESSOR) 25 MG tablet TAKE 1/2 TABLET BY MOUTH TWO TIMES A DAY 90 tablet 3  . nitroGLYCERIN (NITROSTAT) 0.4 MG SL tablet Place 1 tablet (0.4 mg total) under the tongue every 5 (five) minutes as needed. (Patient not taking: Reported on 03/02/2020) 25 tablet 3   No facility-administered medications prior to visit.    ROS Review of Systems  Constitutional: Negative.  Negative for appetite change, chills, diaphoresis, fatigue and fever.  HENT: Negative.   Eyes: Negative for visual disturbance.  Respiratory: Negative for cough, chest tightness, shortness of  breath and wheezing.   Cardiovascular: Negative for chest pain, palpitations and leg swelling.  Gastrointestinal: Negative for abdominal pain, blood in stool, constipation, diarrhea, nausea and vomiting.  Endocrine: Negative.   Genitourinary: Negative.  Negative for difficulty urinating, scrotal swelling and testicular pain.  Musculoskeletal: Negative.  Negative for arthralgias and myalgias.  Skin: Positive for color change and rash.    Objective:  BP 138/72 (BP Location: Left Arm, Patient Position: Sitting, Cuff Size: Normal)   Pulse (!) 56   Temp 98.2 F (36.8 C) (Oral)   Ht 5\' 10"  (1.778 m)   Wt 185 lb 2 oz (84 kg)   SpO2 99%   BMI 26.56 kg/m   BP Readings from Last 3 Encounters:  03/02/20 138/72  03/28/19 128/70  03/18/19 138/84    Wt Readings from Last 3 Encounters:  03/02/20 185 lb 2 oz (84 kg)  03/28/19 190 lb (86.2 kg)  03/18/19 190 lb (86.2 kg)    Physical Exam Vitals reviewed.  Constitutional:      Appearance: Normal appearance.  HENT:     Nose: Nose normal.     Mouth/Throat:     Mouth: Mucous membranes are moist.  Eyes:     General: No scleral icterus.    Conjunctiva/sclera: Conjunctivae normal.  Cardiovascular:     Rate and Rhythm: Regular rhythm. Bradycardia present.     Heart sounds: No murmur. No gallop.      Comments: EKG - Sinus bradycardia, 49 bpm Low voltage  Otherwise normal EKG Pulmonary:     Effort: Pulmonary effort is normal.     Breath sounds: No stridor. No wheezing, rhonchi or rales.  Abdominal:     General: Abdomen is flat. Bowel sounds are normal. There is no distension.     Palpations: Abdomen is soft. There is no hepatomegaly, splenomegaly or mass.     Tenderness: There is no abdominal tenderness.     Hernia: No hernia is present. There is no hernia in the left inguinal area or right inguinal area.  Genitourinary:    Pubic Area: No rash.      Penis: Normal and circumcised. No discharge, swelling or lesions.      Testes: Normal.         Right: Mass, tenderness or swelling not present.        Left: Tenderness or swelling not present.     Epididymis:     Right: Normal. Not inflamed or enlarged. No mass.     Left: Normal. Not inflamed or enlarged. No mass.     Prostate: Enlarged (1+ smooth symm BPH). Not tender and no nodules present.     Rectum: Guaiac result negative. External hemorrhoid and internal hemorrhoid present. No mass, tenderness or anal fissure. Normal anal tone.  Musculoskeletal:        General: No swelling. Normal range of motion.     Cervical back: Neck supple.     Right lower leg: No edema.     Left lower leg: No edema.  Lymphadenopathy:     Cervical: No cervical adenopathy.     Lower Body: No right inguinal adenopathy. No left inguinal adenopathy.  Skin:    Findings: Bruising and rash present. No ecchymosis.     Comments: Scattered across his back there are fawn colored, slightly deep pigmented, coalescing irregularly shaped macules/patches.  There is faint scale. See photo.  Scattered, benign-appearing ecchymoses of various ages are noted on his torso and extremities.  Neurological:     General: No focal deficit present.     Mental Status: He is alert and oriented to person, place, and time. Mental status is at baseline.  Psychiatric:        Mood and Affect: Mood normal.        Behavior: Behavior normal.     Lab Results  Component Value Date   WBC 8.2 03/02/2020   HGB 15.2 03/02/2020   HCT 45.5 03/02/2020   PLT 155.0 03/02/2020   GLUCOSE 119 (H) 03/02/2020   CHOL 117 03/02/2020   TRIG 38.0 03/02/2020   HDL 58.10 03/02/2020   LDLCALC 52 03/02/2020   ALT 18 03/02/2020   AST 21 03/02/2020   NA 137 03/02/2020   K 5.0 03/02/2020   CL 102 03/02/2020   CREATININE 1.02 03/02/2020   BUN 19 03/02/2020   CO2 30 03/02/2020   TSH 3.02 03/02/2020   PSA 0.49 03/02/2020   HGBA1C 5.6 12/11/2018    AAA Duplex  Result Date: 08/21/2019 ABDOMINAL AORTA STUDY Indications: History of a  thoracic aortic aneurysm without rupture. Patient              denies any abdominal pain. Risk Factors: Hypertension, past history of smoking, coronary artery disease. Limitations: Air/bowel gas.  Comparison Study: No prior ultrasound imaging. Recent MRI on 08/07/2019 showed a                   stable aneurysmal dilatation of the ascending thoracic aorta  measuring up to 4.1 cm. Performing Technologist: Sharlett Iles RVT  Examination Guidelines: A complete evaluation includes B-mode imaging, spectral Doppler, color Doppler, and power Doppler as needed of all accessible portions of each vessel. Bilateral testing is considered an integral part of a complete examination. Limited examinations for reoccurring indications may be performed as noted.  Abdominal Aorta Findings: +-------------+-------+----------+----------+-----------+--------+---------+ Location     AP (cm)Trans (cm)PSV (cm/s)Waveform   ThrombusComments  +-------------+-------+----------+----------+-----------+--------+---------+ Proximal     3.10   3.10      96        multiphasic        fusiform  +-------------+-------+----------+----------+-----------+--------+---------+ Mid          2.50   2.60      51        biphasic                     +-------------+-------+----------+----------+-----------+--------+---------+ Distal       2.00   2.00      77        biphasic                     +-------------+-------+----------+----------+-----------+--------+---------+ RT CIA Prox  1.5    1.5       83        biphasic                     +-------------+-------+----------+----------+-----------+--------+---------+ RT CIA Mid   1.6    1.6       71        biphasic                     +-------------+-------+----------+----------+-----------+--------+---------+ RT CIA Distal1.8    1.7       37        biphasic           turbulent  +-------------+-------+----------+----------+-----------+--------+---------+ RT EIA Prox  1.2    1.2       57        biphasic                     +-------------+-------+----------+----------+-----------+--------+---------+ RT EIA Mid                    80        biphasic                     +-------------+-------+----------+----------+-----------+--------+---------+ RT EIA Distal                 79        biphasic                     +-------------+-------+----------+----------+-----------+--------+---------+ LT CIA Prox  1.5    1.5       62        biphasic                     +-------------+-------+----------+----------+-----------+--------+---------+ LT CIA Mid   1.6    1.5       81        biphasic                     +-------------+-------+----------+----------+-----------+--------+---------+ LT CIA Distal1.7    1.7       53        biphasic                     +-------------+-------+----------+----------+-----------+--------+---------+  LT EIA Prox  1.2    1.2       58        biphasic                     +-------------+-------+----------+----------+-----------+--------+---------+ LT EIA Mid                    63        biphasic                     +-------------+-------+----------+----------+-----------+--------+---------+ LT EIA Distal                 67        biphasic                     +-------------+-------+----------+----------+-----------+--------+---------+ Mild aortoiliac atherosclerosis without stenosis. Abnormal dilatation of the proximal abdominal aorta. Mild dilatation of the distal bilateral common iliac arteries. IVC/Iliac Findings: +--------+------+--------+--------+   IVC   PatentThrombusComments +--------+------+--------+--------+ IVC Proxpatent                 +--------+------+--------+--------+  Summary: Abdominal Aorta: There is evidence of abnormal dilatation of the proximal Abdominal aorta. The largest aortic  measurement is 3.1 cm. Mild dilatation of the distal bilateral common iliac arteries. No previous exam available for comparison. IVC/Iliac: Patent IVC.  *See table(s) above for measurements and observations. Suggest follow up study in 24 months.  Electronically signed by Kathlyn Sacramento MD on 08/21/2019 at 2:28:46 PM.    Final     Assessment & Plan:   Quindarius was seen today for annual exam, hypertension, hyperlipidemia, coronary artery disease and rash.  Diagnoses and all orders for this visit:  CAD in native artery- He has had no recent episodes of angina.  Will continue to address risk factor modifications. -     Lipid panel; Future -     EKG 12-Lead -     Lipid panel -     metoprolol succinate (TOPROL-XL) 25 MG 24 hr tablet; Take 0.5 tablets (12.5 mg total) by mouth daily.  Essential hypertension- His blood pressure is adequately well controlled. -     CBC with Differential/Platelet; Future -     Basic metabolic panel; Future -     Hepatic function panel; Future -     Urinalysis, Routine w reflex microscopic; Future -     Urinalysis, Routine w reflex microscopic -     Hepatic function panel -     Basic metabolic panel -     CBC with Differential/Platelet -     metoprolol succinate (TOPROL-XL) 25 MG 24 hr tablet; Take 0.5 tablets (12.5 mg total) by mouth daily.  Acquired hypothyroidism- His TSH is normal.  Thyroid replacement therapy is not indicated. -     TSH; Future -     TSH  Routine general medical examination at a health care facility- Exam completed, labs reviewed, vaccines reviewed, he is due for screening colonoscopy, patient education material was given.  Benign prostatic hyperplasia with weak urinary stream- His PSA is low which is a reassuring sign that he does not have prostate cancer.  He has no symptoms that need to be treated. -     PSA; Future -     PSA  Tinea versicolor -     fluconazole (DIFLUCAN) 100 MG tablet; Take 4 tablets (400 mg total) by mouth once for 1  dose.  Adenomatous rectal polyp -  Ambulatory referral to Gastroenterology  Bradycardia, drug induced- Evaluation for secondary causes is unremarkable.  Fortunately he is asymptomatic with respect to this.  I have asked him to decrease his metoprolol dose by 50%.   I have discontinued Eilan Jedlicka's metoprolol tartrate and celecoxib. I have also changed his irbesartan. Additionally, I am having him start on fluconazole, metoprolol succinate, and celecoxib. Lastly, I am having him maintain his clopidogrel, atorvastatin, and nitroGLYCERIN.  Meds ordered this encounter  Medications  . fluconazole (DIFLUCAN) 100 MG tablet    Sig: Take 4 tablets (400 mg total) by mouth once for 1 dose.    Dispense:  4 tablet    Refill:  0  . metoprolol succinate (TOPROL-XL) 25 MG 24 hr tablet    Sig: Take 0.5 tablets (12.5 mg total) by mouth daily.    Dispense:  45 tablet    Refill:  1  . irbesartan (AVAPRO) 150 MG tablet    Sig: Take 1 tablet (150 mg total) by mouth daily.    Dispense:  90 tablet    Refill:  1  . celecoxib (CELEBREX) 200 MG capsule    Sig: Take 1 capsule (200 mg total) by mouth daily.    Dispense:  90 capsule    Refill:  1   In addition to time spent on CPE, I spent 50 minutes in preparing to see the patient by review of recent labs, imaging and procedures, obtaining and reviewing separately obtained history, communicating with the patient and family or caregiver, ordering medications, tests or procedures, and documenting clinical information in the EHR including the differential Dx, treatment, and any further evaluation and other management of 1. CAD in native artery 2. Essential hypertension 3. Acquired hypothyroidism 4. Benign prostatic hyperplasia with weak urinary stream 5. Tinea versicolor 6. Bradycardia, drug induced 7. Primary osteoarthritis involving multiple joints    Follow-up: Return in about 6 months (around 09/01/2020).  Scarlette Calico, MD

## 2020-03-02 NOTE — Patient Instructions (Signed)

## 2020-03-02 NOTE — Assessment & Plan Note (Signed)
Will increase the dose of celecoxib to try to get better control of his pain.

## 2020-03-11 ENCOUNTER — Other Ambulatory Visit: Payer: Self-pay | Admitting: Cardiology

## 2020-03-14 ENCOUNTER — Other Ambulatory Visit: Payer: Self-pay | Admitting: Cardiovascular Disease

## 2020-03-18 ENCOUNTER — Encounter: Payer: Self-pay | Admitting: Internal Medicine

## 2020-04-22 ENCOUNTER — Telehealth: Payer: Self-pay

## 2020-04-22 NOTE — Telephone Encounter (Signed)
-----   Message from [facility], [place] sent at 03/02/2020  8:19 AM EDT ----- Regarding: COVID Vaccine Patient is suppose to be sending me a pic of his COVID Vaccine

## 2020-05-07 ENCOUNTER — Encounter: Payer: Self-pay | Admitting: Nurse Practitioner

## 2020-05-11 ENCOUNTER — Telehealth: Payer: Medicare Other

## 2020-05-11 NOTE — Chronic Care Management (AMB) (Deleted)
Chronic Care Management Pharmacy  Name: Gregory Hale  MRN: [id number] DOB: Sep 13, 1951   Chief Complaint/ HPI  Gregory Hale,  69 y.o. , male presents for their Follow-Up CCM visit with the clinical pharmacist via telephone.  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 He endorses good sleep habits and denies complaints today.  Office Visits: 03/02/20 Dr Ronnald Ramp OV: decreased metoprolol to 12.5 mg due to bradycardia. Increased celebrex to 200 mg at patient's request.  03/18/19 Dr Ronnald Ramp OV: BP well controlled, TSH normal range, continue meds. Gave PPSV-23.   Consult Visit: 03/28/19 Dr Angelena Form (cardiology): STEMI w/ DES 03/2017. Continued Plavix but stopped aspirin due to easy bruising.   08/20/19: Annual MRA for thoracic ascending aorta dilation. No intervention needed.  Allergies  Allergen Reactions  . Advil [Ibuprofen] Other (See Comments)    bleeding    Medications: Outpatient Encounter Medications as of 05/11/2020  Medication Sig  . atorvastatin (LIPITOR) 80 MG tablet Take 1 tablet (80 mg total) by mouth daily at 6 PM. Please keep upcoming appt in September with Dr.Mcalhany before anymore refills.Thank you  . celecoxib (CELEBREX) 200 MG capsule Take 1 capsule (200 mg total) by mouth daily.  . clopidogrel (PLAVIX) 75 MG tablet Take 1 tablet (75 mg total) by mouth daily. Please keep upcoming appt in September with Dr. Angelena Form before anymore refills. Thank you  . irbesartan (AVAPRO) 150 MG tablet Take 1 tablet (150 mg total) by mouth daily.  . metoprolol succinate (TOPROL-XL) 25 MG 24 hr tablet Take 0.5 tablets (12.5 mg total) by mouth daily.  . nitroGLYCERIN (NITROSTAT) 0.4 MG SL tablet Place 1 tablet (0.4 mg total) under the tongue every 5 (five) minutes as needed. (Patient not taking: Reported on 03/02/2020)  . [DISCONTINUED] celecoxib  (CELEBREX) 100 MG capsule Take 1 capsule (100 mg total) by mouth daily.   No facility-administered encounter medications on file as of 05/11/2020.     Current Diagnosis/Assessment:  Goals Addressed   None     Hypertension   BP goal < 130/80  Office blood pressures are  BP Readings from Last 3 Encounters:  03/02/20 138/72  03/28/19 128/70  03/18/19 138/84   Kidney Function Lab Results  Component Value Date/Time   CREATININE 1.02 03/02/2020 08:44 AM   CREATININE 1.02 08/07/2019 08:43 AM   CREATININE 1.08 06/24/2011 12:00 AM   GFR 72.66 03/02/2020 08:44 AM   GFRNONAA >60 08/07/2019 08:43 AM   GFRAA >60 08/07/2019 08:43 AM   K 5.0 03/02/2020 08:44 AM   K 4.7 12/11/2018 08:54 AM   K 5.2 06/24/2011 12:00 AM   Patient checks BP at home infrequently due to BP always at goal  Patient home BP readings are ranging: 120s/80s-130s/80s  Patient has failed these meds in the past: losartan Patient is currently controlled on the following medications:   irbesartan 150 mg daily,   metoprolol tartrate 25 mg BID  We discussed importance of adherence to medications to prevent a second cardiovascular event. Pt reports 100% adherence. 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  Plan  Continue current medications and control with diet and exercise  Refill NTG for prn use  Hyperlipidemia   LDL goal < 70 CAD: STEMI 03/2017 a/w hypotension and bradycardia -> s/p DES to RCA, otherwise mild disease in LAD, moderate disease in  ostium of the moderate caliber diagonal branch, normal LVEF >65%  Lipid Panel     Component Value Date/Time   CHOL 117 03/02/2020 0844   CHOL 106 05/22/2017 0814   CHOL 179 06/24/2011 0000   TRIG 38.0 03/02/2020 0844   TRIG 63 06/24/2011 0000   HDL 58.10 03/02/2020 0844   HDL 53 05/22/2017 0814   CHOLHDL 2 03/02/2020 0844   VLDL 7.6 03/02/2020 0844   LDLCALC 52 03/02/2020 0844   LDLCALC 45 05/22/2017 0814   LABVLDL 8  05/22/2017 0814   Patient has failed these meds in past: n/a Patient is currently controlled on the following medications:   atorvastatin 80 mg daily  clopidogrel 75 mg daily,   nitroglycerin 0.4 mg SL PRN (never used)  We discussed: importance of compliance for secondary prevention. Pt denies side effects.  Pt has never had to use nitroglycerin before. He reports his MI presented atypically, he did not have any chest pain and was only dizzy. Discussed mechanism of NTG and when to take it if he feels similar symptoms again. Discussed the need to replace NTG every 6-12 months to ensure it is in date if he needs it. He knows his prescription is very old so he would need a new rx.  Plan  Continue current medications   Osteoarthritis   Patient is currently controlled on the following medications:   celecoxib 200 mg daily  We discussed: arthritis slows him down but doesn't stop him, he reports celecoxib works well despite some breakthrough pain. He denies OTC medication use. He used to take Advil but doesn't anymore due to increased bleeding risk.  Plan  Continue current medications  Vaccines   Reviewed and discussed patient's vaccination history.    Immunization History  Administered Date(s) Administered  . Fluad Quad(high Dose 65+) 06/06/2019  . Influenza, High Dose Seasonal PF 06/28/2017, 07/03/2018  . Influenza,inj,Quad PF,6+ Mos 07/20/2015  . Pneumococcal Conjugate-13 01/29/2018  . Pneumococcal Polysaccharide-23 03/18/2019  . Td 09/26/2008  . Tdap 12/11/2018  . Zoster 07/30/2015  . Zoster Recombinat (Shingrix) 06/09/2018, 07/30/2018    Plan  Recommended patient receive *** vaccine in *** office.   Medication Management   Pt uses [facility] for all medications Pt uses weekly pill box Pt uses pill cutter for metoprolol Pt endorses 100% compliance  We discussed: pt recently signed up for auto refills at his pharmacy and he has never had any issues  obtaining his meds when he needs them.   Plan  Continue current medications at current pharmacy  Follow up: *** month phone call   Charlene Brooke, PharmD Clinical Pharmacist [facility] [phone]

## 2020-05-13 ENCOUNTER — Other Ambulatory Visit: Payer: Self-pay | Admitting: Internal Medicine

## 2020-05-13 DIAGNOSIS — M170 Bilateral primary osteoarthritis of knee: Secondary | ICD-10-CM

## 2020-05-14 NOTE — Telephone Encounter (Signed)
Patient has not responded. Closing note.

## 2020-05-21 ENCOUNTER — Telehealth: Payer: Self-pay | Admitting: Pharmacist

## 2020-05-21 NOTE — Progress Notes (Signed)
A user error has taken place: encounter opened in error, closed for administrative reasons.

## 2020-05-22 ENCOUNTER — Telehealth: Payer: Self-pay | Admitting: Pharmacist

## 2020-05-22 NOTE — Progress Notes (Signed)
    Chronic Care Management Pharmacy Assistant   Name: Gregory Hale  MRN: [id number] DOB: 09-03-1951  Reason for Encounter: General Adherence Call   PCP : Janith Lima, MD  Allergies:   Allergies  Allergen Reactions  . Advil [Ibuprofen] Other (See Comments)    bleeding    Medications: Outpatient Encounter Medications as of 05/22/2020  Medication Sig  . atorvastatin (LIPITOR) 80 MG tablet Take 1 tablet (80 mg total) by mouth daily at 6 PM. Please keep upcoming appt in September with Dr.Mcalhany before anymore refills.Thank you  . celecoxib (CELEBREX) 200 MG capsule Take 1 capsule (200 mg total) by mouth daily.  . clopidogrel (PLAVIX) 75 MG tablet Take 1 tablet (75 mg total) by mouth daily. Please keep upcoming appt in September with Dr. Angelena Form before anymore refills. Thank you  . irbesartan (AVAPRO) 150 MG tablet Take 1 tablet (150 mg total) by mouth daily.  . metoprolol succinate (TOPROL-XL) 25 MG 24 hr tablet Take 0.5 tablets (12.5 mg total) by mouth daily.  . nitroGLYCERIN (NITROSTAT) 0.4 MG SL tablet Place 1 tablet (0.4 mg total) under the tongue every 5 (five) minutes as needed. (Patient not taking: Reported on 03/02/2020)  . [DISCONTINUED] celecoxib (CELEBREX) 100 MG capsule Take 1 capsule (100 mg total) by mouth daily.   No facility-administered encounter medications on file as of 05/22/2020.    Current Diagnosis: Patient Active Problem List   Diagnosis Date Noted  . Benign prostatic hyperplasia with weak urinary stream 03/02/2020  . Tinea versicolor 03/02/2020  . Adenomatous rectal polyp 03/02/2020  . Bradycardia, drug induced 03/02/2020  . Primary osteoarthritis involving multiple joints 03/02/2020  . Routine general medical examination at a health care facility 12/11/2018  . Essential hypertension 05/03/2017  . Ascending aorta dilatation (HCC)   . CAD in native artery   . Hypothyroidism 11/02/2009  . ERECTILE DYSFUNCTION 11/02/2009  . COLONIC POLYPS, HX OF  11/02/2009    Goals Addressed   None     Follow-Up:  Pharmacist Review    Per Clinical Pharmacist the patient missed his follow up I was instructed to call the patient to review his medications and see if he has any questions regarding his medications or health. The patient stated that he is good with his medications and he is feeling great right now. But he will like to reschedule his appointment with the pharmacist he will call when he is ready.     Rosendo Gros, [place] Pharmacist Assistant  [phone]

## 2020-06-07 ENCOUNTER — Other Ambulatory Visit: Payer: Self-pay | Admitting: Cardiovascular Disease

## 2020-06-07 NOTE — Progress Notes (Signed)
Chief Complaint  Patient presents with   Follow-up    CAD   History of Present Illness: 69 yo male with history of CAD, former tobacco abuse, OA and thoracic aortic aneurysm here today for cardiac follow up. He was admitted to [facility] 04/22/17 with an acute inferior STEMI. His RCA was occluded and was treated with a drug eluting stent. There was mild disease noted in the LAD and moderate disease in the Diagonal branch. LVEF=65%. Echo 04/23/17 with normal LV systolic function, FBPZ=02-58%, no valve disease, mild dilation of aortic root. He did well following his MI. He has been followed for mild dilation of the ascending aorta. Most recent chest MRA November 2020 with 4.1 cm ascending aortic aneurysm.   He is here today for follow up. The patient denies any chest pain, dyspnea, palpitations, lower extremity edema, orthopnea, PND, dizziness, near syncope or syncope.    Primary Care Physician: Janith Lima, MD  Past Medical History:  Diagnosis Date   Arthritis    Ascending aorta dilatation (HCC)    Bradycardia    a. HR 40 in setting of acute inferior STEMI 03/2017.   CAD in native artery    a. inf STEMI 03/2017 a/w hypotension and bradycardia -> s/p DES to RCA, otherwise mild disease in LAD, moderate disease in ostium of the moderate caliber diagonal branch, normal LVEF >65%   Dilated aortic root (HCC)    STEMI involving right coronary artery ([place])    04/22/17 PCI/DES to mRCA, normal EF   Tobacco abuse    Quit in 1998    Past Surgical History:  Procedure Laterality Date   CORONARY STENT INTERVENTION N/A 04/22/2017   Procedure: Coronary Stent Intervention;  Surgeon: [name], MD;  Location: [place] [facility];  Service: Cardiovascular;  Laterality: N/A;   LEFT HEART CATH AND CORONARY ANGIOGRAPHY N/A 04/22/2017   Procedure: Left Heart Cath and Coronary Angiography;  Surgeon: [name], MD;  Location: [place] [facility];  Service: Cardiovascular;   Laterality: N/A;   parotid Left 1980   TONSILLECTOMY     as a child    Current Outpatient Medications  Medication Sig Dispense Refill   atorvastatin (LIPITOR) 80 MG tablet TAKE ONE TABLET BY MOUTH EVERY EVENING AT 6 90 tablet 2   celecoxib (CELEBREX) 200 MG capsule Take 1 capsule (200 mg total) by mouth daily. 90 capsule 1   irbesartan (AVAPRO) 150 MG tablet Take 1 tablet (150 mg total) by mouth daily. 90 tablet 1   metoprolol succinate (TOPROL-XL) 25 MG 24 hr tablet Take 0.5 tablets (12.5 mg total) by mouth daily. 45 tablet 1   nitroGLYCERIN (NITROSTAT) 0.4 MG SL tablet Place 1 tablet (0.4 mg total) under the tongue every 5 (five) minutes as needed. 25 tablet 3   aspirin EC 81 MG tablet Take 1 tablet (81 mg total) by mouth daily. Swallow whole. 90 tablet 3   No current facility-administered medications for this visit.    Allergies  Allergen Reactions   Advil [Ibuprofen] Other (See Comments)    bleeding    Social History   Socioeconomic History   Marital status: Married    Spouse name: Not on file   Number of children: Not on file   Years of education: Not on file   Highest education level: Not on file  Occupational History   Not on file  Tobacco Use   Smoking status: Former Smoker   Smokeless tobacco: Never Used  Substance and  Sexual Activity   Alcohol use: Yes    Alcohol/week: 10.0 standard drinks    Types: 10 Cans of beer per week   Drug use: No   Sexual activity: Yes    Partners: Female  Other Topics Concern   Not on file  Social History Narrative   Not on file   Social Determinants of Health   Financial Resource Strain:    Difficulty of Paying Living Expenses: Not on file  Food Insecurity:    Worried About Charity fundraiser in the Last Year: Not on file   [employer] of Food in the Last Year: Not on file  Transportation Needs:    Lack of Transportation (Medical): Not on file   Lack of Transportation (Non-Medical): Not on file    Physical Activity:    Days of Exercise per Week: Not on file   Minutes of Exercise per Session: Not on file  Stress:    Feeling of Stress : Not on file  Social Connections:    Frequency of Communication with Friends and Family: Not on file   Frequency of Social Gatherings with Friends and Family: Not on file   Attends Religious Services: Not on file   Active Member of Clubs or Organizations: Not on file   Attends Archivist Meetings: Not on file   Marital Status: Not on file  Intimate Partner Violence:    Fear of Current or Ex-Partner: Not on file   Emotionally Abused: Not on file   Physically Abused: Not on file   Sexually Abused: Not on file    Family History  Problem Relation Age of Onset   CAD Brother     Review of Systems:  As stated in the HPI and otherwise negative.   BP 116/70    Pulse 62    Ht 5\' 10"  (1.778 m)    Wt 184 lb (83.5 kg)    SpO2 98%    BMI 26.40 kg/m   Physical Examination:  General: Well developed, well nourished, NAD  HEENT: OP clear, mucus membranes moist  SKIN: warm, dry. No rashes. Neuro: No focal deficits  Musculoskeletal: Muscle strength 5/5 all ext  Psychiatric: Mood and affect normal  Neck: No JVD, no carotid bruits, no thyromegaly, no lymphadenopathy.  Lungs:Clear bilaterally, no wheezes, rhonci, crackles Cardiovascular: Regular rate and rhythm. No murmurs, gallops or rubs. Abdomen:Soft. Bowel sounds present. Non-tender.  Extremities: No lower extremity edema. Pulses are 2 + in the bilateral DP/PT.  Echo 04/23/17: - Left ventricle: The cavity size was normal. Wall thickness was   increased in a pattern of mild LVH. Systolic function was normal.   The estimated ejection fraction was in the range of 60% to 65%.   Wall motion was normal; there were no regional wall motion   abnormalities. Doppler parameters are consistent with abnormal   left ventricular relaxation (grade 1 diastolic dysfunction). - Aortic root:  The aortic root was mildly dilated. - Ascending aorta: The ascending aorta was mildly dilated. - Mitral valve: There was trivial regurgitation. - Right atrium: Central venous pressure (est): 3 mm Hg. - Atrial septum: No defect or patent foramen ovale was identified. - Tricuspid valve: There was mild regurgitation. - Pulmonary arteries: PA peak pressure: 24 mm Hg (S). - Pericardium, extracardiac: There was no pericardial effusion.  Impressions:  - Mild LVH with LVEF 60-65% and grade 1 diastolic dysfunction.   Trivial mitral regurgitation. Mildly dilated aortic root and   ascending aorta. Mild tricuspid  regurgitation with PASP 24 mmHg.  Cardiac cath 04/22/17:  Mid LAD lesion, 30 %stenosed.  Ost 2nd Diag lesion, 70 %stenosed.  A STENT RESOLUTE ONYX 5.0X18 drug eluting stent was successfully placed.  Prox RCA lesion, 100 %stenosed.  Post intervention, there is a 0% residual stenosis.  The left ventricular systolic function is normal.  LV end diastolic pressure is normal.  The left ventricular ejection fraction is greater than 65% by visual estimate.  There is no mitral valve regurgitation.  Diagnostic Diagram       Post-Intervention Diagram          EKG:  EKG is ordered today. The ekg ordered today demonstrates Sinus  Recent Labs: 03/02/2020: ALT 18; BUN 19; Creatinine, Ser 1.02; Hemoglobin 15.2; Platelets 155.0; Potassium 5.0; Sodium 137; TSH 3.02   Lipid Panel    Component Value Date/Time   CHOL 117 03/02/2020 0844   CHOL 106 05/22/2017 0814   CHOL 179 06/24/2011 0000   TRIG 38.0 03/02/2020 0844   TRIG 63 06/24/2011 0000   HDL 58.10 03/02/2020 0844   HDL 53 05/22/2017 0814   CHOLHDL 2 03/02/2020 0844   VLDL 7.6 03/02/2020 0844   LDLCALC 52 03/02/2020 0844   LDLCALC 45 05/22/2017 0814     Wt Readings from Last 3 Encounters:  06/08/20 184 lb (83.5 kg)  03/02/20 185 lb 2 oz (84 kg)  03/28/19 190 lb (86.2 kg)     Other studies Reviewed: Additional  studies/ records that were reviewed today include: . Review of the above records demonstrates:    Assessment and Plan:   1. CAD without angina: He has no chest pain. LDL at goal. Easy bruising so will stop Plavix and restart ASA 81 mg daily. Will continue beta blocker and statin  2. Thoracic aortic aneurysm: Chest MRA November 2020 with stable 4.1 cm ascending aortic aneurysm.  Will plan MRA of the chest/abdomen/pelvis in November 2022 to evaluate his entire aorta.   Current medicines are reviewed at length with the patient today.  The patient does not have concerns regarding medicines.  The following changes have been made:  no change  Labs/ tests ordered today include:   Orders Placed This Encounter  Procedures   EKG 12-Lead   Disposition:   FU with me in 12 months  Signed, Lauree Chandler, MD 06/08/2020 2:37 PM    [address], [place], [place]  [address] Phone: [phone]; Fax: [phone]

## 2020-06-08 ENCOUNTER — Encounter: Payer: Self-pay | Admitting: Cardiovascular Disease

## 2020-06-08 ENCOUNTER — Ambulatory Visit (INDEPENDENT_AMBULATORY_CARE_PROVIDER_SITE_OTHER): Payer: Medicare Other | Admitting: Cardiovascular Disease

## 2020-06-08 ENCOUNTER — Other Ambulatory Visit: Payer: Self-pay

## 2020-06-08 VITALS — BP 116/70 | HR 62 | Ht 70.0 in | Wt 184.0 lb

## 2020-06-08 DIAGNOSIS — I712 Thoracic aortic aneurysm, without rupture, unspecified: Secondary | ICD-10-CM

## 2020-06-08 DIAGNOSIS — I2510 Atherosclerotic heart disease of native coronary artery without angina pectoris: Secondary | ICD-10-CM | POA: Diagnosis not present

## 2020-06-08 MED ORDER — ASPIRIN EC 81 MG PO TBEC: 81.0000 mg | DELAYED_RELEASE_TABLET | Freq: Every day | ORAL | 3 refills | Status: AC

## 2020-06-08 NOTE — Patient Instructions (Addendum)
Medication Instructions:  1) STOP PLAVIX 2) START ASPIRIN 81 mg daily *If you need a refill on your cardiac medications before your next appointment, please call your pharmacy*  Testing/Procedures: Dr. Angelena Form recommends you have an MRI chest/ABD/pelvis in November, 2021.  Follow-Up: At [facility], you and your health needs are our priority.  As part of our continuing mission to provide you with exceptional heart care, we have created designated Provider Care Teams.  These Care Teams include your primary Cardiologist (physician) and Advanced Practice Providers (APPs -  Physician Assistants and Nurse Practitioners) who all work together to provide you with the care you need, when you need it. Your next appointment:   12 month(s) The format for your next appointment:   In Person Provider:   Lauree Chandler

## 2020-06-09 ENCOUNTER — Other Ambulatory Visit: Payer: Self-pay | Admitting: Internal Medicine

## 2020-06-09 DIAGNOSIS — M170 Bilateral primary osteoarthritis of knee: Secondary | ICD-10-CM

## 2020-06-09 DIAGNOSIS — I2510 Atherosclerotic heart disease of native coronary artery without angina pectoris: Secondary | ICD-10-CM

## 2020-06-09 DIAGNOSIS — I10 Essential (primary) hypertension: Secondary | ICD-10-CM

## 2020-06-10 ENCOUNTER — Other Ambulatory Visit: Payer: Self-pay | Admitting: Internal Medicine

## 2020-06-10 ENCOUNTER — Other Ambulatory Visit: Payer: Self-pay

## 2020-06-10 DIAGNOSIS — I2510 Atherosclerotic heart disease of native coronary artery without angina pectoris: Secondary | ICD-10-CM

## 2020-06-10 DIAGNOSIS — I10 Essential (primary) hypertension: Secondary | ICD-10-CM

## 2020-06-10 DIAGNOSIS — I712 Thoracic aortic aneurysm, without rupture, unspecified: Secondary | ICD-10-CM

## 2020-06-10 MED ORDER — METOPROLOL SUCCINATE ER 25 MG PO TB24: 12.5000 mg | ORAL_TABLET | Freq: Every day | ORAL | 1 refills | Status: DC

## 2020-06-11 ENCOUNTER — Other Ambulatory Visit: Payer: Self-pay | Admitting: Internal Medicine

## 2020-06-11 DIAGNOSIS — M170 Bilateral primary osteoarthritis of knee: Secondary | ICD-10-CM

## 2020-06-15 ENCOUNTER — Encounter: Payer: Self-pay | Admitting: Internal Medicine

## 2020-06-18 ENCOUNTER — Telehealth: Payer: Self-pay | Admitting: Pharmacist

## 2020-06-18 NOTE — Progress Notes (Signed)
    Chronic Care Management Pharmacy Assistant   Name: Gregory Hale  MRN: [id number] DOB: 1950/11/11  Reason for Encounter: General Adherence Call    PCP : Janith Lima, MD  Allergies:   Allergies  Allergen Reactions  . Advil [Ibuprofen] Other (See Comments)    bleeding    Medications: Outpatient Encounter Medications as of 06/18/2020  Medication Sig  . aspirin EC 81 MG tablet Take 1 tablet (81 mg total) by mouth daily. Swallow whole.  Marland Kitchen atorvastatin (LIPITOR) 80 MG tablet TAKE ONE TABLET BY MOUTH EVERY EVENING AT 6  . celecoxib (CELEBREX) 200 MG capsule Take 1 capsule (200 mg total) by mouth daily.  . irbesartan (AVAPRO) 150 MG tablet Take 1 tablet (150 mg total) by mouth daily.  . metoprolol succinate (TOPROL-XL) 25 MG 24 hr tablet Take 0.5 tablets (12.5 mg total) by mouth daily.  . nitroGLYCERIN (NITROSTAT) 0.4 MG SL tablet Place 1 tablet (0.4 mg total) under the tongue every 5 (five) minutes as needed.  . [DISCONTINUED] celecoxib (CELEBREX) 100 MG capsule Take 1 capsule (100 mg total) by mouth daily.   No facility-administered encounter medications on file as of 06/18/2020.    Current Diagnosis: Patient Active Problem List   Diagnosis Date Noted  . Benign prostatic hyperplasia with weak urinary stream 03/02/2020  . Tinea versicolor 03/02/2020  . Adenomatous rectal polyp 03/02/2020  . Bradycardia, drug induced 03/02/2020  . Primary osteoarthritis involving multiple joints 03/02/2020  . Routine general medical examination at a health care facility 12/11/2018  . Essential hypertension 05/03/2017  . Ascending aorta dilatation (HCC)   . CAD in native artery   . Hypothyroidism 11/02/2009  . ERECTILE DYSFUNCTION 11/02/2009  . COLONIC POLYPS, HX OF 11/02/2009    Goals Addressed   None     Follow-Up:  Pharmacist Review    A general adherence call was made to Gregory Hale for a wellness call to follow up to see how he has been doing. After speaking with the patient he  stated that he is doing well since coming off his blood thinner and he is only taking the baby aspirin right now. Over all he stated that is doing well there is nothing new with his health right now.   Rosendo Gros, [place] Pharmacist Assistant  [phone]

## 2020-06-21 DIAGNOSIS — Z23 Encounter for immunization: Secondary | ICD-10-CM | POA: Diagnosis not present

## 2020-06-22 ENCOUNTER — Encounter: Payer: Self-pay | Admitting: Nurse Practitioner

## 2020-06-22 ENCOUNTER — Ambulatory Visit (INDEPENDENT_AMBULATORY_CARE_PROVIDER_SITE_OTHER): Payer: Medicare Other | Admitting: Nurse Practitioner

## 2020-06-22 VITALS — BP 136/80 | HR 52 | Ht 68.5 in | Wt 188.1 lb

## 2020-06-22 DIAGNOSIS — Z1211 Encounter for screening for malignant neoplasm of colon: Secondary | ICD-10-CM

## 2020-06-22 DIAGNOSIS — I2510 Atherosclerotic heart disease of native coronary artery without angina pectoris: Secondary | ICD-10-CM

## 2020-06-22 MED ORDER — NA SULFATE-K SULFATE-MG SULF 17.5-3.13-1.6 GM/177ML PO SOLN: 1.0000 | Freq: Once | ORAL | 0 refills | Status: AC

## 2020-06-22 NOTE — Patient Instructions (Addendum)
If you are age 68 or older, your body mass index should be between 23-30. Your Body mass index is 28.19 kg/m. If this is out of the aforementioned range listed, please consider follow up with your Primary Care Provider.  If you are age 53 or younger, your body mass index should be between 19-25. Your Body mass index is 28.19 kg/m. If this is out of the aformentioned range listed, please consider follow up with your Primary Care Provider.    We have sent the following medications to your pharmacy for you to pick up at your convenience:  suprep for colonoscopy  Due to recent changes in healthcare laws, you may see the results of your imaging and laboratory studies on MyChart before your provider has had a chance to review them.  We understand that in some cases there may be results that are confusing or concerning to you. Not all laboratory results come back in the same time frame and the provider may be waiting for multiple results in order to interpret others.  Please give [place] 48 hours in order for your provider to thoroughly review all the results before contacting the office for clarification of your results.   Thank you for choosing me and [facility] Tye Savoy, NP

## 2020-06-22 NOTE — Progress Notes (Signed)
Reviewed and agree with management plan.  Shyna Duignan T. Halvor Behrend, MD FACG [facility]  

## 2020-06-22 NOTE — Progress Notes (Signed)
ASSESSMENT AND PLAN    # Colon cancer screening --Due for 10 year screening.  --Patient will be scheduled for a colonoscopy. The risks and benefits of colonoscopy with possible polypectomy / biopsies were discussed and the patient agrees to proceed.   # CAD / MI / Stent placement 2018. Plavix discontinued 1-2 weeks ago. On daily baby asa.     HISTORY OF PRESENT ILLNESS     Primary Gastroenterologist : Previously Dr. Sharlett Iles Chief Complaint : time for colonoscopy.   Gregory Hale is a 69 y.o. male with PMH / Williams significant for,  but not necessarily limited to: CAD/STEMI status post stent placement 2018, thoracic aortic aneurysm, arthritis  Patient is due for colonoscopy. He has no Gi complaints. Specifically no bowel changes or blood in stool. Post CAD /stent he was on Plavix but this was discontinued by Cardiology a couple of weeks ago. He does take a baby asa daily. No chest pain, dizziness nor SOB.    Data Reviewed: 03/02/20 CBC normal Renal function normal Tbili 1.4, LFTs o/w normal.   Previous Endoscopic Evaluations / Pertinent Studies:  Colonoscopy Aug 2011 --hyperplastic polyps  Past Medical History:  Diagnosis Date  . Arthritis   . Ascending aorta dilatation (HCC)   . Bradycardia    a. HR 40 in setting of acute inferior STEMI 03/2017.  Marland Kitchen CAD in native artery    a. inf STEMI 03/2017 a/w hypotension and bradycardia -> s/p DES to RCA, otherwise mild disease in LAD, moderate disease in ostium of the moderate caliber diagonal branch, normal LVEF >65%  . Dilated aortic root ([place])   . Heart attack ([place])   . STEMI involving right coronary artery ([place])    04/22/17 PCI/DES to mRCA, normal EF  . Tobacco abuse    Quit in 1998     Past Surgical History:  Procedure Laterality Date  . CORONARY STENT INTERVENTION N/A 04/22/2017   Procedure: Coronary Stent Intervention;  Surgeon: [name], MD;  Location: [place] [facility];  Service: Cardiovascular;   Laterality: N/A;  . LEFT HEART CATH AND CORONARY ANGIOGRAPHY N/A 04/22/2017   Procedure: Left Heart Cath and Coronary Angiography;  Surgeon: [name], MD;  Location: [place] [facility];  Service: Cardiovascular;  Laterality: N/A;  . parotid Left 1980  . TONSILLECTOMY     as a child   Family History  Problem Relation Age of Onset  . CAD Brother   . Diabetes Brother   . Rheumatic fever Father   . Other Father        rheumatic heart  . Pancreatic cancer Sister    Social History   Tobacco Use  . Smoking status: Former Smoker    Types: Cigarettes    Quit date: 1990    Years since quitting: 31.7  . Smokeless tobacco: Never Used  Vaping Use  . Vaping Use: Never used  Substance Use Topics  . Alcohol use: Yes    Alcohol/week: 10.0 standard drinks    Types: 10 Cans of beer per week    Comment: .5 per day  . Drug use: No   Current Outpatient Medications  Medication Sig Dispense Refill  . aspirin EC 81 MG tablet Take 1 tablet (81 mg total) by mouth daily. Swallow whole. 90 tablet 3  . atorvastatin (LIPITOR) 80 MG tablet TAKE ONE TABLET BY MOUTH EVERY EVENING AT 6 90 tablet 2  . celecoxib (CELEBREX) 200 MG capsule Take 1 capsule (200 mg  total) by mouth daily. 90 capsule 1  . irbesartan (AVAPRO) 150 MG tablet Take 1 tablet (150 mg total) by mouth daily. 90 tablet 1  . metoprolol succinate (TOPROL-XL) 25 MG 24 hr tablet Take 0.5 tablets (12.5 mg total) by mouth daily. 45 tablet 1  . nitroGLYCERIN (NITROSTAT) 0.4 MG SL tablet Place 1 tablet (0.4 mg total) under the tongue every 5 (five) minutes as needed. (Patient not taking: Reported on 06/22/2020) 25 tablet 3   No current facility-administered medications for this visit.   Allergies  Allergen Reactions  . Advil [Ibuprofen] Other (See Comments)    bleeding     Review of Systems: All systems reviewed and negative except where noted in HPI.   PHYSICAL EXAM :    Wt Readings from Last 3 Encounters:  06/22/20 188 lb  2 oz (85.3 kg)  06/08/20 184 lb (83.5 kg)  03/02/20 185 lb 2 oz (84 kg)    BP 136/80 (BP Location: Left Arm, Patient Position: Sitting, Cuff Size: Normal)   Pulse (!) 52   Ht 5' 8.5" (1.74 m) Comment: height measured without shoes  Wt 188 lb 2 oz (85.3 kg)   BMI 28.19 kg/m  Constitutional:  Pleasant male in no acute distress. Psychiatric: Normal mood and affect. Behavior is normal. EENT: Pupils normal.  Conjunctivae are normal. No scleral icterus. Neck supple.  Cardiovascular: Normal rate, regular rhythm. No edema Pulmonary/chest: Effort normal and breath sounds normal. No wheezing, rales or rhonchi. Abdominal: Soft, nondistended, nontender. Bowel sounds active throughout. There are no masses palpable. No hepatomegaly. Neurological: Alert and oriented to person place and time. Skin: Skin is warm and dry. No rashes noted.  Tye Savoy, NP  06/22/2020, 9:38 AM

## 2020-06-30 ENCOUNTER — Telehealth: Payer: Self-pay | Admitting: Nurse Practitioner

## 2020-06-30 NOTE — Telephone Encounter (Signed)
Spoke with the pharmacist at [facility], she ran the medication again with the coupon and it came up to 92.00. I told her that the patient would get back in touch with her if he is going to pick it up. She was unwilling to see what else was covered she told me I would have to try his insurance company.

## 2020-06-30 NOTE — Telephone Encounter (Signed)
Pt called stating that copay for suprep is $90. He stated that he was told at the office that he would not pay more that $40 so pt is requesting if we can contact his pharmacy to clarify and then call him.

## 2020-06-30 NOTE — Telephone Encounter (Signed)
Spoke with Gregory Hale and explained the amount of the prep. The patient said he would pick it up regardless of the cost. He was offered split dose Miralax but did not want to have something like that.

## 2020-07-06 ENCOUNTER — Other Ambulatory Visit: Payer: Self-pay | Admitting: Internal Medicine

## 2020-07-06 DIAGNOSIS — M170 Bilateral primary osteoarthritis of knee: Secondary | ICD-10-CM

## 2020-07-06 DIAGNOSIS — Z23 Encounter for immunization: Secondary | ICD-10-CM | POA: Diagnosis not present

## 2020-07-13 ENCOUNTER — Telehealth: Payer: Self-pay | Admitting: Internal Medicine

## 2020-07-13 NOTE — Telephone Encounter (Signed)
celecoxib (CELEBREX) 100 MG capsule celecoxib (CELEBREX) 200 MG capsule [place], [place] Phone:  [phone]  Fax:  [phone]     Patient has been taking the 200 mg but when he went to pick up his refill it was the 100 mg and wasn't sure if this was a mistake or if Dr. Ronnald Ramp has changed the medication  Patient can be reached at # [phone]

## 2020-07-14 ENCOUNTER — Other Ambulatory Visit: Payer: Self-pay | Admitting: Internal Medicine

## 2020-07-14 DIAGNOSIS — H52223 Regular astigmatism, bilateral: Secondary | ICD-10-CM | POA: Diagnosis not present

## 2020-07-14 DIAGNOSIS — H524 Presbyopia: Secondary | ICD-10-CM | POA: Diagnosis not present

## 2020-07-14 DIAGNOSIS — H353131 Nonexudative age-related macular degeneration, bilateral, early dry stage: Secondary | ICD-10-CM | POA: Diagnosis not present

## 2020-07-14 DIAGNOSIS — H43811 Vitreous degeneration, right eye: Secondary | ICD-10-CM | POA: Diagnosis not present

## 2020-07-14 DIAGNOSIS — H2513 Age-related nuclear cataract, bilateral: Secondary | ICD-10-CM | POA: Diagnosis not present

## 2020-07-14 DIAGNOSIS — M159 Polyosteoarthritis, unspecified: Secondary | ICD-10-CM

## 2020-07-14 DIAGNOSIS — H40023 Open angle with borderline findings, high risk, bilateral: Secondary | ICD-10-CM | POA: Diagnosis not present

## 2020-07-14 DIAGNOSIS — H5203 Hypermetropia, bilateral: Secondary | ICD-10-CM | POA: Diagnosis not present

## 2020-07-14 MED ORDER — CELECOXIB 200 MG PO CAPS: 200.0000 mg | ORAL_CAPSULE | Freq: Every day | ORAL | 1 refills | Status: DC

## 2020-07-20 ENCOUNTER — Other Ambulatory Visit: Payer: Self-pay

## 2020-07-20 ENCOUNTER — Other Ambulatory Visit: Payer: Medicare Other

## 2020-07-20 DIAGNOSIS — I712 Thoracic aortic aneurysm, without rupture, unspecified: Secondary | ICD-10-CM

## 2020-07-20 LAB — BASIC METABOLIC PANEL
BUN/Creatinine Ratio: 16 (ref 10–24)
BUN: 16 mg/dL (ref 8–27)
CO2: 26 mmol/L (ref 20–29)
Calcium: 9.1 mg/dL (ref 8.6–10.2)
Chloride: 102 mmol/L (ref 96–106)
Creatinine, Ser: 1.01 mg/dL (ref 0.76–1.27)
GFR calc Af Amer: 88 mL/min/{1.73_m2} (ref >59)
GFR calc non Af Amer: 76 mL/min/{1.73_m2} (ref >59)
Glucose: 108 mg/dL — ABNORMAL HIGH (ref 65–99)
Potassium: 5.2 mmol/L (ref 3.5–5.2)
Sodium: 139 mmol/L (ref 134–144)

## 2020-07-27 ENCOUNTER — Other Ambulatory Visit: Payer: Self-pay

## 2020-07-27 ENCOUNTER — Ambulatory Visit (HOSPITAL_COMMUNITY)
Admission: RE | Admit: 2020-07-27 | Discharge: 2020-07-27 | Disposition: A | Discharge status: Home / Self Care | Payer: Medicare Other | Source: Ambulatory Visit | Attending: Cardiovascular Disease | Admitting: Cardiovascular Disease

## 2020-07-27 DIAGNOSIS — I712 Thoracic aortic aneurysm, without rupture, unspecified: Secondary | ICD-10-CM

## 2020-07-27 DIAGNOSIS — R918 Other nonspecific abnormal finding of lung field: Secondary | ICD-10-CM | POA: Diagnosis not present

## 2020-07-27 MED ORDER — GADOBUTROL 1 MMOL/ML IV SOLN
8.5000 mL | Freq: Once | INTRAVENOUS | Status: AC | PRN
  Administered 2020-07-27: 8.5 mL via INTRAVENOUS

## 2020-08-04 DIAGNOSIS — N5201 Erectile dysfunction due to arterial insufficiency: Secondary | ICD-10-CM | POA: Diagnosis not present

## 2020-08-04 DIAGNOSIS — R351 Nocturia: Secondary | ICD-10-CM | POA: Diagnosis not present

## 2020-08-10 ENCOUNTER — Ambulatory Visit (AMBULATORY_SURGERY_CENTER): Payer: Medicare Other | Admitting: Gastroenterology

## 2020-08-10 ENCOUNTER — Encounter: Payer: Self-pay | Admitting: Gastroenterology

## 2020-08-10 ENCOUNTER — Other Ambulatory Visit: Payer: Self-pay

## 2020-08-10 VITALS — BP 144/76 | HR 48 | Temp 97.7°F | Resp 12 | Ht 68.0 in | Wt 188.0 lb

## 2020-08-10 DIAGNOSIS — D128 Benign neoplasm of rectum: Secondary | ICD-10-CM | POA: Diagnosis not present

## 2020-08-10 DIAGNOSIS — D122 Benign neoplasm of ascending colon: Secondary | ICD-10-CM

## 2020-08-10 DIAGNOSIS — D127 Benign neoplasm of rectosigmoid junction: Secondary | ICD-10-CM | POA: Diagnosis not present

## 2020-08-10 DIAGNOSIS — Z1211 Encounter for screening for malignant neoplasm of colon: Secondary | ICD-10-CM | POA: Diagnosis not present

## 2020-08-10 DIAGNOSIS — D125 Benign neoplasm of sigmoid colon: Secondary | ICD-10-CM

## 2020-08-10 DIAGNOSIS — D123 Benign neoplasm of transverse colon: Secondary | ICD-10-CM | POA: Diagnosis not present

## 2020-08-10 MED ORDER — SODIUM CHLORIDE 0.9 % IV SOLN: 500.0000 mL | Freq: Once | INTRAVENOUS | Status: DC

## 2020-08-10 NOTE — Progress Notes (Signed)
VS taken by C.W. 

## 2020-08-10 NOTE — Patient Instructions (Signed)
Handouts given;  Polyps, Diverticulosis, Hemorrhoids, High Fiber Diet Start High Fiber Diet Continue present medications Await pathology results  YOU HAD AN ENDOSCOPIC PROCEDURE TODAY AT [place]:   Refer to the procedure report that was given to you for any specific questions about what was found during the examination.  If the procedure report does not answer your questions, please call your gastroenterologist to clarify.  If you requested that your care partner not be given the details of your procedure findings, then the procedure report has been included in a sealed envelope for you to review at your convenience later.  YOU SHOULD EXPECT: Some feelings of bloating in the abdomen. Passage of more gas than usual.  Walking can help get rid of the air that was put into your GI tract during the procedure and reduce the bloating. If you had a lower endoscopy (such as a colonoscopy or flexible sigmoidoscopy) you may notice spotting of blood in your stool or on the toilet paper. If you underwent a bowel prep for your procedure, you may not have a normal bowel movement for a few days.  Please Note:  You might notice some irritation and congestion in your nose or some drainage.  This is from the oxygen used during your procedure.  There is no need for concern and it should clear up in a day or so.  SYMPTOMS TO REPORT IMMEDIATELY:   Following lower endoscopy (colonoscopy or flexible sigmoidoscopy):  Excessive amounts of blood in the stool  Significant tenderness or worsening of abdominal pains  Swelling of the abdomen that is new, acute  Fever of 100F or higher  For urgent or emergent issues, a gastroenterologist can be reached at any hour by calling [phone]. Do not use MyChart messaging for urgent concerns.   DIET:  We do recommend a small meal at first, but then you may proceed to your regular diet.  Drink plenty of fluids but you should avoid alcoholic beverages for 24  hours.  ACTIVITY:  You should plan to take it easy for the rest of today and you should NOT DRIVE or use heavy machinery until tomorrow (because of the sedation medicines used during the test).    FOLLOW UP: Our staff will call the number listed on your records 48-72 hours following your procedure to check on you and address any questions or concerns that you may have regarding the information given to you following your procedure. If we do not reach you, we will leave a message.  We will attempt to reach you two times.  During this call, we will ask if you have developed any symptoms of COVID 19. If you develop any symptoms (ie: fever, flu-like symptoms, shortness of breath, cough etc.) before then, please call [phone].  If you test positive for Covid 19 in the 2 weeks post procedure, please call and report this information to [place].    If any biopsies were taken you will be contacted by phone or by letter within the next 1-3 weeks.  Please call us at [phone] if you have not heard about the biopsies in 3 weeks.   SIGNATURES/CONFIDENTIALITY: You and/or your care partner have signed paperwork which will be entered into your electronic medical record.  These signatures attest to the fact that that the information above on your After Visit Summary has been reviewed and is understood.  Full responsibility of the confidentiality of this discharge information lies with you and/or your care-partner.

## 2020-08-10 NOTE — Progress Notes (Signed)
A/ox3, pleased with MAC, report to RN 

## 2020-08-10 NOTE — Progress Notes (Signed)
Called to room to assist during endoscopic procedure.  Patient ID and intended procedure confirmed with present staff. Received instructions for my participation in the procedure from the performing physician.  

## 2020-08-10 NOTE — Op Note (Addendum)
[place] Patient Name: Gregory Hale Procedure Date: 08/10/2020 2:18 PM MRN: [id number] Endoscopist: Ladene Artist , MD Age: 69 Referring MD:  Date of Birth: 1950-11-13 Gender: Male Account #: [id number] Procedure:                Colonoscopy Indications:              Screening for colorectal malignant neoplasm Medicines:                Monitored Anesthesia Care Procedure:                Pre-Anesthesia Assessment:                           - Prior to the procedure, a History and Physical                            was performed, and patient medications and                            allergies were reviewed. The patient's tolerance of                            previous anesthesia was also reviewed. The risks                            and benefits of the procedure and the sedation                            options and risks were discussed with the patient.                            All questions were answered, and informed consent                            was obtained. Prior Anticoagulants: The patient has                            taken no previous anticoagulant or antiplatelet                            agents. ASA Grade Assessment: II - A patient with                            mild systemic disease. After reviewing the risks                            and benefits, the patient was deemed in                            satisfactory condition to undergo the procedure.                           After obtaining informed consent, the colonoscope  was passed under direct vision. Throughout the                            procedure, the patient's blood pressure, pulse, and                            oxygen saturations were monitored continuously. The                            Colonoscope was introduced through the anus and                            advanced to the the cecum, identified by                            appendiceal orifice and  ileocecal valve. The                            ileocecal valve, appendiceal orifice, and rectum                            were photographed. The quality of the bowel                            preparation was adequate after extensive lavage,                            suction. The colonoscopy was performed without                            difficulty. The patient tolerated the procedure                            well. Scope In: 2:27:36 PM Scope Out: 2:51:25 PM Scope Withdrawal Time: 0 hours 20 minutes 27 seconds  Total Procedure Duration: 0 hours 23 minutes 49 seconds  Findings:                 Skin tags were found on perianal exam. Anal nodules                            noted on DRE which correlated with hypertrophied                            anal papillae noted on retroflexion.                           Nine sessile polyps were found in the rectum (1),                            sigmoid colon (5) and transverse colon (3). The                            polyps were 5 to 7 mm in size. These polyps were  removed with a cold snare. Resection and retrieval                            were complete.                           Two sessile polyps were found in the ascending                            colon. The polyps were 3 to 4 mm in size. These                            polyps were removed with a cold biopsy forceps.                            Resection and retrieval were complete.                           Multiple small-mouthed diverticula were found in                            the left colon. There was no evidence of                            diverticular bleeding.                           Anal papillae, several, were hypertrophied.                           Internal hemorrhoids were found during                            retroflexion. The hemorrhoids were small and Grade                            I (internal hemorrhoids that do not prolapse).                            The exam was otherwise without abnormality on                            direct and retroflexion views. Complications:            No immediate complications. Estimated blood loss:                            None. Estimated Blood Loss:     Estimated blood loss: none. Impression:               - Perianal skin tags found on perianal exam and                            anal nodules on DRE c/w hypertrophied anal papillae.                           -  Nine 5 to 7 mm polyps in the rectum, in the                            sigmoid colon and in the transverse colon, removed                            with a cold snare. Resected and retrieved.                           - Two 3 to 4 mm polyps in the ascending colon,                            removed with a cold biopsy forceps. Resected and                            retrieved.                           - Mild diverticulosis in the left colon. There was                            no evidence of diverticular bleeding.                           - Anal papilla(e) were hypertrophied.                           - Internal hemorrhoids.                           - The examination was otherwise normal on direct                            and retroflexion views. Recommendation:           - Repeat colonoscopy after studies are complete for                            surveillance based on pathology results with a more                            extensive bowel prep.                           - Patient has a contact number available for                            emergencies. The signs and symptoms of potential                            delayed complications were discussed with the                            patient. Return to normal activities tomorrow.  Written discharge instructions were provided to the                            patient.                           - High fiber diet.                           -  Continue present medications.                           - Await pathology results. Ladene Artist, MD 08/10/2020 2:59:29 PM This report has been signed electronically.

## 2020-08-12 ENCOUNTER — Telehealth: Payer: Self-pay | Admitting: *Deleted

## 2020-08-12 NOTE — Telephone Encounter (Signed)
  Follow up Call-  Call back number 08/10/2020  Post procedure Call Back phone  # [phone]  Permission to leave phone message Yes  Some recent data might be hidden     Patient questions:  Do you have a fever, pain , or abdominal swelling? No. Pain Score  0 *  Have you tolerated food without any problems? Yes.    Have you been able to return to your normal activities? Yes.    Do you have any questions about your discharge instructions: Diet   No. Medications  No. Follow up visit  No.  Do you have questions or concerns about your Care? No.  Actions: * If pain score is 4 or above: 1. No action needed, pain <4.Have you developed a fever since your procedure? no  2.   Have you had an respiratory symptoms (SOB or cough) since your procedure? no  3.   Have you tested positive for COVID 19 since your procedure no  4.   Have you had any family members/close contacts diagnosed with the COVID 19 since your procedure?  no   If yes to any of these questions please route to Joylene John, RN and Joella Prince, RN

## 2020-08-17 ENCOUNTER — Other Ambulatory Visit: Payer: Self-pay | Admitting: Internal Medicine

## 2020-08-17 DIAGNOSIS — I10 Essential (primary) hypertension: Secondary | ICD-10-CM

## 2020-08-17 DIAGNOSIS — I2510 Atherosclerotic heart disease of native coronary artery without angina pectoris: Secondary | ICD-10-CM

## 2020-08-18 ENCOUNTER — Encounter: Payer: Self-pay | Admitting: Gastroenterology

## 2020-09-01 ENCOUNTER — Telehealth: Payer: Self-pay | Admitting: Pharmacist

## 2020-09-01 NOTE — Progress Notes (Signed)
    Chronic Care Management Pharmacy Assistant   Name: Gregory Hale  MRN: [id number] DOB: 03/17/1951  Reason for Encounter: General Adherence Call   PCP : Janith Lima, MD  Allergies:   Allergies  Allergen Reactions  . Advil [Ibuprofen] Other (See Comments)    bleeding    Medications: Outpatient Encounter Medications as of 09/01/2020  Medication Sig  . aspirin EC 81 MG tablet Take 1 tablet (81 mg total) by mouth daily. Swallow whole.  Marland Kitchen atorvastatin (LIPITOR) 80 MG tablet TAKE ONE TABLET BY MOUTH EVERY EVENING AT 6  . celecoxib (CELEBREX) 200 MG capsule Take 1 capsule (200 mg total) by mouth daily.  . irbesartan (AVAPRO) 150 MG tablet TAKE ONE TABLET BY MOUTH DAILY  . metoprolol succinate (TOPROL-XL) 25 MG 24 hr tablet Take 0.5 tablets (12.5 mg total) by mouth daily.  . nitroGLYCERIN (NITROSTAT) 0.4 MG SL tablet Place 1 tablet (0.4 mg total) under the tongue every 5 (five) minutes as needed. (Patient not taking: Reported on 06/22/2020)  . [DISCONTINUED] celecoxib (CELEBREX) 100 MG capsule Take 1 capsule (100 mg total) by mouth daily.   No facility-administered encounter medications on file as of 09/01/2020.    Current Diagnosis: Patient Active Problem List   Diagnosis Date Noted  . Benign prostatic hyperplasia with weak urinary stream 03/02/2020  . Tinea versicolor 03/02/2020  . Adenomatous rectal polyp 03/02/2020  . Bradycardia, drug induced 03/02/2020  . Primary osteoarthritis involving multiple joints 03/02/2020  . Routine general medical examination at a health care facility 12/11/2018  . Essential hypertension 05/03/2017  . Ascending aorta dilatation (HCC)   . CAD in native artery   . Hypothyroidism 11/02/2009  . ERECTILE DYSFUNCTION 11/02/2009  . COLONIC POLYPS, HX OF 11/02/2009    Goals Addressed   None     Follow-Up:  Pharmacist Review   A general adherence call was made to Gregory Hale for a wellness call to follow up to see how he has been since his last  visit with the clinical pharmacist Mendel Ryder. The patient stated that he was not having any issues and that he felt find. He states that he is doing well on all of his medications, and feels find. He has no problems or concerns that he needs to discuss, over all he is doing well. I told the patient that I will pass along the information to the clinical pharmacist Mendel Ryder.   Wendy Poet, Clinical Pharmacist Assistant [facility]

## 2020-09-24 ENCOUNTER — Telehealth: Payer: Self-pay | Admitting: Pharmacist

## 2020-09-24 NOTE — Progress Notes (Signed)
    Chronic Care Management Pharmacy Assistant   Name: Gregory Hale  MRN: [id number] DOB: 06-Dec-1950  Reason for Encounter: Chart Review   PCP : Etta Grandchild, MD  Allergies:   Allergies  Allergen Reactions  . Advil [Ibuprofen] Other (See Comments)    bleeding    Medications: Outpatient Encounter Medications as of 09/24/2020  Medication Sig  . aspirin EC 81 MG tablet Take 1 tablet (81 mg total) by mouth daily. Swallow whole.  Marland Kitchen atorvastatin (LIPITOR) 80 MG tablet TAKE ONE TABLET BY MOUTH EVERY EVENING AT 6  . celecoxib (CELEBREX) 200 MG capsule Take 1 capsule (200 mg total) by mouth daily.  . irbesartan (AVAPRO) 150 MG tablet TAKE ONE TABLET BY MOUTH DAILY  . metoprolol succinate (TOPROL-XL) 25 MG 24 hr tablet Take 0.5 tablets (12.5 mg total) by mouth daily.  . nitroGLYCERIN (NITROSTAT) 0.4 MG SL tablet Place 1 tablet (0.4 mg total) under the tongue every 5 (five) minutes as needed. (Patient not taking: Reported on 06/22/2020)   No facility-administered encounter medications on file as of 09/24/2020.    Current Diagnosis: Patient Active Problem List   Diagnosis Date Noted  . Benign prostatic hyperplasia with weak urinary stream 03/02/2020  . Tinea versicolor 03/02/2020  . Adenomatous rectal polyp 03/02/2020  . Bradycardia, drug induced 03/02/2020  . Primary osteoarthritis involving multiple joints 03/02/2020  . Routine general medical examination at a health care facility 12/11/2018  . Essential hypertension 05/03/2017  . Ascending aorta dilatation (HCC)   . CAD in native artery   . Hypothyroidism 11/02/2009  . ERECTILE DYSFUNCTION 11/02/2009  . COLONIC POLYPS, HX OF 11/02/2009    Goals Addressed   None     Follow-Up:  Pharmacist Review   Reviewed chart for medication changes and adherence.  No OVs, Consults, or hospital visits since last care coordination call / Pharmacist visit. No medication changes indicated  No gaps in adherence identified. Patient has  follow up scheduled with pharmacy team. No further action required.  Fredric Mare, [facility]  Practice Team Manager/ CPA (Clinical Pharmacist Assistant) [phone]

## 2020-10-15 DIAGNOSIS — M5459 Other low back pain: Secondary | ICD-10-CM | POA: Diagnosis not present

## 2020-10-15 DIAGNOSIS — M25511 Pain in right shoulder: Secondary | ICD-10-CM | POA: Diagnosis not present

## 2020-10-15 DIAGNOSIS — M545 Low back pain, unspecified: Secondary | ICD-10-CM | POA: Diagnosis not present

## 2020-12-16 ENCOUNTER — Ambulatory Visit (INDEPENDENT_AMBULATORY_CARE_PROVIDER_SITE_OTHER): Payer: Medicare Other | Admitting: Pharmacist

## 2020-12-16 ENCOUNTER — Other Ambulatory Visit: Payer: Self-pay

## 2020-12-16 DIAGNOSIS — M8949 Other hypertrophic osteoarthropathy, multiple sites: Secondary | ICD-10-CM | POA: Diagnosis not present

## 2020-12-16 DIAGNOSIS — M150 Primary generalized (osteo)arthritis: Secondary | ICD-10-CM

## 2020-12-16 DIAGNOSIS — I10 Essential (primary) hypertension: Secondary | ICD-10-CM | POA: Diagnosis not present

## 2020-12-16 DIAGNOSIS — I2510 Atherosclerotic heart disease of native coronary artery without angina pectoris: Secondary | ICD-10-CM | POA: Diagnosis not present

## 2020-12-16 DIAGNOSIS — E785 Hyperlipidemia, unspecified: Secondary | ICD-10-CM

## 2020-12-16 DIAGNOSIS — M159 Polyosteoarthritis, unspecified: Secondary | ICD-10-CM

## 2020-12-16 NOTE — Progress Notes (Signed)
Chronic Care Management Pharmacy Note  12/16/2020 Name:  Gregory Hale MRN:  [id number] DOB:  07-Jun-1951  Subjective: Gregory Hale is an 70 y.o. year old male who is a primary patient of Janith Lima, MD.  The CCM team was consulted for assistance with disease management and care coordination needs.    Engaged with patient by telephone for follow up visit in response to provider referral for pharmacy case management and/or care coordination services.   Consent to Services:  The patient was given the following information about Chronic Care Management services today, agreed to services, and gave verbal consent: 1. CCM service includes personalized support from designated clinical staff supervised by the primary care provider, including individualized plan of care and coordination with other care providers 2. 24/7 contact phone numbers for assistance for urgent and routine care needs. 3. Service will only be billed when office clinical staff spend 20 minutes or more in a month to coordinate care. 4. Only one practitioner may furnish and bill the service in a calendar month. 5.The patient may stop CCM services at any time (effective at the end of the month) by phone call to the office staff. 6. The patient will be responsible for cost sharing (co-pay) of up to 20% of the service fee (after annual deductible is met). Patient agreed to services and consent obtained.  Patient Care Team: Janith Lima, MD as PCP - General (Internal Medicine) [name], MD as PCP - Cardiology (Cardiology) Charlton Haws, [facility] as Pharmacist (Pharmacist)  Recent office visits: 03/02/20 Dr Ronnald Ramp OV: CPX, labs stable no med changes.  Recent consult visits: 08/10/20 Dr Fuller Plan - colonoscopy 06/22/20 Dr Chester Holstein (GI): ordered colonoscopy 06/08/20 Dr Angelena Form ( cardiology): changed clopidogrel to aspirin.  Hospital visits: None in previous 6 months  Objective:  Lab Results  Component Value Date    CREATININE 1.01 07/20/2020   BUN 16 07/20/2020   GFR 72.66 03/02/2020   GFRNONAA 76 07/20/2020   GFRAA 88 07/20/2020   NA 139 07/20/2020   K 5.2 07/20/2020   CALCIUM 9.1 07/20/2020   CO2 26 07/20/2020   GLUCOSE 108 (H) 07/20/2020    Lab Results  Component Value Date/Time   HGBA1C 5.6 12/11/2018 08:54 AM   HGBA1C 5.4 06/05/2015 12:00 AM   GFR 72.66 03/02/2020 08:44 AM   GFR 76.38 12/11/2018 08:54 AM    Last diabetic Eye exam: No results found for: HMDIABEYEEXA  Last diabetic Foot exam: No results found for: HMDIABFOOTEX   Lab Results  Component Value Date   CHOL 117 03/02/2020   HDL 58.10 03/02/2020   LDLCALC 52 03/02/2020   TRIG 38.0 03/02/2020   CHOLHDL 2 03/02/2020    Hepatic Function Latest Ref Rng & Units 03/02/2020 12/11/2018 05/22/2017  Total Protein 6.0 - 8.3 g/dL 6.3 6.9 6.2  Albumin 3.5 - 5.2 g/dL 4.2 4.3 4.0  AST 0 - 37 U/L _0 ALT 0 - 53 U/L _1 Alk Phosphatase 39 - 117 U/L 75 70 78  Total Bilirubin 0.2 - 1.2 mg/dL 1.4(H) 1.1 1.4(H)  Bilirubin, Direct 0.0 - 0.3 mg/dL 0.3 - -    Lab Results  Component Value Date/Time   TSH 3.02 03/02/2020 08:44 AM   TSH 3.78 12/11/2018 08:54 AM    CBC Latest Ref Rng & Units 03/02/2020 12/11/2018 04/24/2017  WBC 4.0 - 10.5 K/uL 8.2 7.4 9.1  Hemoglobin 13.0 - 17.0 g/dL 15.2 15.7 14.8  Hematocrit 39.0 - 52.0 %  45.5 46.3 44.1  Platelets 150.0 - 400.0 K/uL 155.0 156.0 145(L)    No results found for: VD25OH  Clinical ASCVD: Yes  The ASCVD Risk score Mikey Bussing DC Jr., et al., 2013) failed to calculate for the following reasons:   The valid total cholesterol range is 130 to 320 mg/dL    Depression screen [facility] 2/9 03/02/2020 12/12/2018 12/11/2018  Decreased Interest 0 0 0  Down, Depressed, Hopeless 0 0 0  PHQ - 2 Score 0 0 0  Altered sleeping 1 - -  Tired, decreased energy 0 - -  Change in appetite 0 - -  Feeling bad or failure about yourself  0 - -  Trouble concentrating 0 - -  Moving slowly or fidgety/restless 0 -  -  Suicidal thoughts 0 - -  PHQ-9 Score 1 - -  Difficult doing work/chores Not difficult at all - -     Social History   Tobacco Use  Smoking Status Former Smoker  . Types: Cigarettes  . Quit date: 106  . Years since quitting: 32.2  Smokeless Tobacco Never Used   BP Readings from Last 3 Encounters:  08/10/20 (!) 144/76  06/22/20 136/80  06/08/20 116/70   Pulse Readings from Last 3 Encounters:  08/10/20 (!) 48  06/22/20 (!) 52  06/08/20 62   Wt Readings from Last 3 Encounters:  08/10/20 188 lb (85.3 kg)  06/22/20 188 lb 2 oz (85.3 kg)  06/08/20 184 lb (83.5 kg)   BMI Readings from Last 3 Encounters:  08/10/20 28.59 kg/m  06/22/20 28.19 kg/m  06/08/20 26.40 kg/m    Assessment/Interventions: Review of patient past medical history, allergies, medications, health status, including review of consultants reports, laboratory and other test data, was performed as part of comprehensive evaluation and provision of chronic care management services.   SDOH:  (Social Determinants of Health) assessments and interventions performed: Yes   SDOH Screenings   Alcohol Screen: Not on file  Depression (PHQ2-9): Low Risk   . PHQ-2 Score: 1  Financial Resource Strain: Not on file  Food Insecurity: Not on file  Housing: Not on file  Physical Activity: Not on file  Social Connections: Not on file  Stress: Not on file  Tobacco Use: Medium Risk  . Smoking Tobacco Use: Former Smoker  . Smokeless Tobacco Use: Never Used  Transportation Needs: Not on file    CCM Care Plan  Allergies  Allergen Reactions  . Advil [Ibuprofen] Other (See Comments)    bleeding    Medications Reviewed Today    Reviewed by Charlton Haws, [facility] (Pharmacist) on 12/16/20 at 1321  Med List Status: <None>  Medication Order Taking? Sig Documenting Provider Last Dose Status Informant  acetaminophen (TYLENOL) 500 MG tablet [id number] Yes Take 500 mg by mouth every 6 (six) hours as needed. [provider] Taking Active   aspirin EC 81 MG tablet [id number] Yes Take 1 tablet (81 mg total) by mouth daily. Swallow whole. [name], MD Taking Active   atorvastatin (LIPITOR) 80 MG tablet [id number] Yes TAKE ONE TABLET BY MOUTH EVERY EVENING AT 6 [name], MD Taking Active   celecoxib (CELEBREX) 200 MG capsule [id number] Yes Take 1 capsule (200 mg total) by mouth daily. Janith Lima, MD Taking Active   irbesartan (AVAPRO) 150 MG tablet [id number] Yes TAKE ONE TABLET BY MOUTH DAILY Janith Lima, MD Taking Active   metoprolol succinate (TOPROL-XL) 25 MG 24 hr tablet [id number] Yes Take 0.5 tablets (12.5  mg total) by mouth daily. Janith Lima, MD Taking Active   nitroGLYCERIN (NITROSTAT) 0.4 MG SL tablet [id number] Yes Place 1 tablet (0.4 mg total) under the tongue every 5 (five) minutes as needed. [name], MD Taking Active           Patient Active Problem List   Diagnosis Date Noted  . Benign prostatic hyperplasia with weak urinary stream 03/02/2020  . Tinea versicolor 03/02/2020  . Adenomatous rectal polyp 03/02/2020  . Bradycardia, drug induced 03/02/2020  . Primary osteoarthritis involving multiple joints 03/02/2020  . Routine general medical examination at a health care facility 12/11/2018  . Essential hypertension 05/03/2017  . Ascending aorta dilatation (HCC)   . CAD in native artery   . Hypothyroidism 11/02/2009  . ERECTILE DYSFUNCTION 11/02/2009  . COLONIC POLYPS, HX OF 11/02/2009    Immunization History  Administered Date(s) Administered  . Fluad Quad(high Dose 65+) 06/06/2019  . Influenza, High Dose Seasonal PF 06/28/2017, 07/03/2018  . Influenza,inj,Quad PF,6+ Mos 07/20/2015  . PFIZER(Purple Top)SARS-COV-2 Vaccination 10/14/2019, 11/08/2019, 06/21/2020  . Pneumococcal Conjugate-13 01/29/2018  . Pneumococcal Polysaccharide-23 03/18/2019  . Td 09/26/2008  . Tdap 12/11/2018  . Zoster 07/30/2015  . Zoster Recombinat  (Shingrix) 06/09/2018, 07/30/2018    Conditions to be addressed/monitored:  Hypertension, Hyperlipidemia, Coronary Artery Disease and Osteoarthritis  Care Plan : [place]  Updates made by Charlton Haws, [place] since 12/16/2020 12:00 AM    Problem: Hypertension, Hyperlipidemia, Coronary Artery Disease and Osteoarthritis   Priority: High    Long-Range Goal: Disease Management   Start Date: 12/16/2020  Expected End Date: 12/16/2021  This Visit's Progress: On track  Priority: High  Note:   Current Barriers:  . Unable to independently monitor therapeutic efficacy  Pharmacist Clinical Goal(s):  Marland Kitchen Patient will achieve adherence to monitoring guidelines and medication adherence to achieve therapeutic efficacy through collaboration with PharmD and provider.   Interventions: . 1:1 collaboration with Janith Lima, MD regarding development and update of comprehensive plan of care as evidenced by provider attestation and co-signature . Inter-disciplinary care team collaboration (see longitudinal plan of care) . Comprehensive medication review performed; medication list updated in electronic medical record  Hypertension (BP goal <130/80) -Controlled -Current treatment: . Irbesartan 150 mg daily . Metoprolol succinate 25 mg - 1/2 tab daily -Medications previously tried: losartan  -Current exercise habits: golfing, walking -Denies hypotensive/hypertensive symptoms -Educated on BP goals and benefits of medications for prevention of heart attack, stroke and kidney damage; -Counseled to monitor BP at home weekly, document, and provide log at future appointments -Recommended to continue current medication  Hyperlipidemia / CAD: (LDL goal < 70) -Controlled - pt reports improvement in bruising since switching Plavix to aspirin -hx STEMI w/ DES 03/2017 -Current treatment: . Atorvastatin 80 mg daily . Aspirin 81 mg daily . Nitroglycerin 0.4 mg SL prn -Medications previously  tried: clopidogrel  -Educated on Cholesterol goals;  Benefits of statin for ASCVD risk reduction; -Recommended to continue current medication  Osteoarthritis (Goal: manage pain) -Controlled - pt reports pain is reasonably managed with celecoxib, he takes ibuprofen infrequently for breakthrough pain -Current treatment  . Celecoxib 200 mg daily . Ibuprofen 200 mg PRN (every 2 weeks) -Counseled on interaction between celecoxib and ibuprofen, increased risk for kidney damage, GI ulcer and high BP -Recommended to use Tylenol instead of ibuprofen for breakthrough pain - up to 3000 mg/day   Patient Goals/Self-Care Activities . Patient will:  - take medications as prescribed focus on  medication adherence by routine check blood pressure weekly, document, and provide at future appointments target a minimum of 150 minutes of moderate intensity exercise weekly  Follow Up Plan: Telephone follow up appointment with care management team member scheduled for: 1 year      Medication Assistance: None required.  Patient affirms current coverage meets needs.  Patient's preferred pharmacy is:  [facility], [address] [place] [address] Phone: [phone] Fax: [phone]  Uses pill box? No - prefers bottles Pt endorses 100% compliance  We discussed: Current pharmacy is preferred with insurance plan and patient is satisfied with pharmacy services Patient decided to: Continue current medication management strategy  Care Plan and Follow Up Patient Decision:  Patient agrees to Care Plan and Follow-up.  Plan: Telephone follow up appointment with care management team member scheduled for:  1 year  Charlene Brooke, PharmD, [facility] Clinical Pharmacist [facility] [phone]

## 2020-12-16 NOTE — Patient Instructions (Signed)
Visit Information  Phone number for Pharmacist: [phone]  Goals Addressed   None    Patient Care Plan: CCM Pharmacy Care Plan    Problem Identified: Hypertension, Hyperlipidemia, Coronary Artery Disease and Osteoarthritis   Priority: High    Long-Range Goal: Disease Management   Start Date: 12/16/2020  Expected End Date: 12/16/2021  This Visit's Progress: On track  Priority: High  Note:   Current Barriers:  . Unable to independently monitor therapeutic efficacy  Pharmacist Clinical Goal(s):  Marland Kitchen Patient will achieve adherence to monitoring guidelines and medication adherence to achieve therapeutic efficacy through collaboration with PharmD and provider.   Interventions: . 1:1 collaboration with Janith Lima, MD regarding development and update of comprehensive plan of care as evidenced by provider attestation and co-signature . Inter-disciplinary care team collaboration (see longitudinal plan of care) . Comprehensive medication review performed; medication list updated in electronic medical record  Hypertension (BP goal <130/80) -Controlled -Current treatment: . Irbesartan 150 mg daily . Metoprolol succinate 25 mg - 1/2 tab daily -Medications previously tried: losartan  -Current exercise habits: golfing, walking -Denies hypotensive/hypertensive symptoms -Educated on BP goals and benefits of medications for prevention of heart attack, stroke and kidney damage; -Counseled to monitor BP at home weekly, document, and provide log at future appointments -Recommended to continue current medication  Hyperlipidemia / CAD: (LDL goal < 70) -Controlled - pt reports improvement in bruising since switching Plavix to aspirin -hx STEMI w/ DES 03/2017 -Current treatment: . Atorvastatin 80 mg daily . Aspirin 81 mg daily . Nitroglycerin 0.4 mg SL prn -Medications previously tried: clopidogrel  -Educated on Cholesterol goals;  Benefits of statin for ASCVD risk  reduction; -Recommended to continue current medication  Osteoarthritis (Goal: manage pain) -Controlled - pt reports pain is reasonably managed with celecoxib, he takes ibuprofen infrequently for breakthrough pain -Current treatment  . Celecoxib 200 mg daily . Ibuprofen 200 mg PRN (every 2 weeks) -Counseled on interaction between celecoxib and ibuprofen, increased risk for kidney damage, GI ulcer and high BP -Recommended to use Tylenol instead of ibuprofen for breakthrough pain - up to 3000 mg/day   Patient Goals/Self-Care Activities . Patient will:  - take medications as prescribed focus on medication adherence by routine check blood pressure weekly, document, and provide at future appointments target a minimum of 150 minutes of moderate intensity exercise weekly  Follow Up Plan: Telephone follow up appointment with care management team member scheduled for: 1 year      Patient verbalizes understanding of instructions provided today and agrees to view in [place].  Telephone follow up appointment with pharmacy team member scheduled for: 1 year  Charlene Brooke, PharmD, [facility] Clinical Pharmacist [facility] [phone]

## 2020-12-28 DIAGNOSIS — Z23 Encounter for immunization: Secondary | ICD-10-CM | POA: Diagnosis not present

## 2021-01-11 ENCOUNTER — Other Ambulatory Visit: Payer: Self-pay | Admitting: Internal Medicine

## 2021-01-11 ENCOUNTER — Telehealth: Payer: Self-pay

## 2021-01-11 NOTE — Progress Notes (Addendum)
Chronic Care Management Pharmacy Assistant   Name: Gregory Hale  MRN: [id number] DOB: 1951-05-16   Reason for Encounter: Disease State-Hypertension  Recent office visits:  None noted  Recent consult visits:  None noted  Hospital visits:  None in previous 6 months  Medications: Outpatient Encounter Medications as of 01/11/2021  Medication Sig   acetaminophen (TYLENOL) 500 MG tablet Take 500 mg by mouth every 6 (six) hours as needed.   aspirin EC 81 MG tablet Take 1 tablet (81 mg total) by mouth daily. Swallow whole.   atorvastatin (LIPITOR) 80 MG tablet TAKE ONE TABLET BY MOUTH EVERY EVENING AT 6   celecoxib (CELEBREX) 100 MG capsule TAKE ONE CAPSULE BY MOUTH DAILY   celecoxib (CELEBREX) 200 MG capsule Take 1 capsule (200 mg total) by mouth daily.   irbesartan (AVAPRO) 150 MG tablet TAKE ONE TABLET BY MOUTH DAILY   metoprolol succinate (TOPROL-XL) 25 MG 24 hr tablet Take 0.5 tablets (12.5 mg total) by mouth daily.   nitroGLYCERIN (NITROSTAT) 0.4 MG SL tablet Place 1 tablet (0.4 mg total) under the tongue every 5 (five) minutes as needed.   No facility-administered encounter medications on file as of 01/11/2021.    Reviewed chart prior to disease state call. Spoke with patient regarding BP  Recent Office Vitals: BP Readings from Last 3 Encounters:  08/10/20 (!) 144/76  06/22/20 136/80  06/08/20 116/70   Pulse Readings from Last 3 Encounters:  08/10/20 (!) 48  06/22/20 (!) 52  06/08/20 62    Wt Readings from Last 3 Encounters:  08/10/20 188 lb (85.3 kg)  06/22/20 188 lb 2 oz (85.3 kg)  06/08/20 184 lb (83.5 kg)     Kidney Function Lab Results  Component Value Date/Time   CREATININE 1.01 07/20/2020 09:38 AM   CREATININE 1.02 03/02/2020 08:44 AM   CREATININE 1.08 06/24/2011 12:00 AM   GFR 72.66 03/02/2020 08:44 AM   GFRNONAA 76 07/20/2020 09:38 AM   GFRAA 88 07/20/2020 09:38 AM    BMP Latest Ref Rng & Units 07/20/2020 03/02/2020 08/07/2019  Glucose 65 - 99  mg/dL 108(H) 119(H) -  BUN 8 - 27 mg/dL 16 19 -  Creatinine 0.76 - 1.27 mg/dL 1.01 1.02 1.02  BUN/Creat Ratio 10 - 24 16 - -  Sodium 134 - 144 mmol/L 139 137 -  Potassium 3.5 - 5.2 mmol/L 5.2 5.0 -  Chloride 96 - 106 mmol/L 102 102 -  CO2 20 - 29 mmol/L 26 30 -  Calcium 8.6 - 10.2 mg/dL 9.1 9.0 -    Current antihypertensive regimen:  Irbesartan 150 mg daily Metoprolol succinate 25 mg - 1/2 tab daily  How often are you checking your Blood Pressure? 1-2x per week   Current home BP readings:   Patient states he don't record his BP readings but he ranges from 440/347 systolic and  low /42/59 diastolic.  What recent interventions/DTPs have been made by any provider to improve Blood Pressure control since last CPP Visit:   None noted  Any recent hospitalizations or ED visits since last visit with CPP? No   What diet changes have been made to improve Blood Pressure Control?  Patient states his wife is a retired Marine scientist and she cooks healthy meals for him.  What exercise is being done to improve your Blood Pressure Control?  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  Adherence Review: Is the patient currently on ACE/ARB medication? Yes Does the patient have >5 day gap between last estimated fill dates? No Irbesartan - last fill 11/27/20 90D Metoprolol succinate 25 mg - last fill 12/29/20 30D   Star Rating Drugs: Atorvastatin - last fill 12/02/20 80D Irbesartan - last fill 11/27/20 [place], [place] Clinical Pharmacists Assistant [phone]

## 2021-01-12 ENCOUNTER — Telehealth: Payer: Self-pay

## 2021-01-12 NOTE — Progress Notes (Signed)
error 

## 2021-02-09 ENCOUNTER — Telehealth: Payer: Self-pay | Admitting: Internal Medicine

## 2021-02-09 NOTE — Telephone Encounter (Signed)
Updated time spent to non-elligible CCM time as patient has revoked consent for CCM and disenrolled.

## 2021-02-09 NOTE — Telephone Encounter (Signed)
Patient consented to CCM services 11/06/19 (see telephone note from that date) which includes regular follow up calls from members of the CCM team.  The CCM encounter from 12/16/20 was a follow up call with clinical pharmacist. CCM time spent includes time with patient in the call, chart review, documentation, and coordination of care which for this particular encounter totalled 40 min.  I called to speak with patient's wife Blanch Media. I explained the above. She reports they did not have any out-of-pocket cost related to the charge. She maintains that the patient did not consent to the call or charge. I again explained how CCM works and consent was indeed obtained prior to initial scheduling.   Patient would like to escalate to billing department to discuss having the charge reversed.   At patient's request I have cancelled all future CCM appointments and dis-enrolled patient from CCM services.

## 2021-02-09 NOTE — Telephone Encounter (Signed)
Patient's wife Gregory Hale calling regarding EOB received for DOS: 3.23.22. Gregory Hale states her husband was not seen and it was a 3 minute phone call that he didn't consent to.  Patient is requesting charges be removed.  Please follow-up Beulah Matusek @ [phone]

## 2021-02-12 ENCOUNTER — Telehealth: Payer: Self-pay | Admitting: Internal Medicine

## 2021-02-12 NOTE — Telephone Encounter (Signed)
Spoke with patient's wife Blanch Media. Informed her the request has been sent to billing department to remove patient balance from CCM charge DOS 12/16/20. She voiced understanding.

## 2021-02-12 NOTE — Telephone Encounter (Signed)
Patients wife calling, requesting to speak with Mendel Ryder [phone]

## 2021-02-19 ENCOUNTER — Other Ambulatory Visit: Payer: Self-pay | Admitting: Cardiovascular Disease

## 2021-02-21 ENCOUNTER — Other Ambulatory Visit: Payer: Self-pay | Admitting: Internal Medicine

## 2021-02-21 DIAGNOSIS — I10 Essential (primary) hypertension: Secondary | ICD-10-CM

## 2021-02-21 DIAGNOSIS — I2510 Atherosclerotic heart disease of native coronary artery without angina pectoris: Secondary | ICD-10-CM

## 2021-03-03 ENCOUNTER — Other Ambulatory Visit: Payer: Self-pay | Admitting: Internal Medicine

## 2021-03-03 DIAGNOSIS — I2510 Atherosclerotic heart disease of native coronary artery without angina pectoris: Secondary | ICD-10-CM

## 2021-03-03 DIAGNOSIS — I10 Essential (primary) hypertension: Secondary | ICD-10-CM

## 2021-03-08 ENCOUNTER — Other Ambulatory Visit: Payer: Self-pay | Admitting: Internal Medicine

## 2021-03-08 DIAGNOSIS — I10 Essential (primary) hypertension: Secondary | ICD-10-CM

## 2021-03-08 DIAGNOSIS — I2510 Atherosclerotic heart disease of native coronary artery without angina pectoris: Secondary | ICD-10-CM

## 2021-03-09 ENCOUNTER — Encounter: Payer: Self-pay | Admitting: Internal Medicine

## 2021-03-09 ENCOUNTER — Ambulatory Visit (INDEPENDENT_AMBULATORY_CARE_PROVIDER_SITE_OTHER): Payer: Medicare Other | Admitting: Internal Medicine

## 2021-03-09 ENCOUNTER — Other Ambulatory Visit: Payer: Self-pay

## 2021-03-09 VITALS — BP 132/76 | HR 53 | Temp 98.6°F | Ht 68.0 in | Wt 190.0 lb

## 2021-03-09 DIAGNOSIS — I2510 Atherosclerotic heart disease of native coronary artery without angina pectoris: Secondary | ICD-10-CM | POA: Diagnosis not present

## 2021-03-09 DIAGNOSIS — R3912 Poor urinary stream: Secondary | ICD-10-CM

## 2021-03-09 DIAGNOSIS — R001 Bradycardia, unspecified: Secondary | ICD-10-CM

## 2021-03-09 DIAGNOSIS — N401 Enlarged prostate with lower urinary tract symptoms: Secondary | ICD-10-CM

## 2021-03-09 DIAGNOSIS — I10 Essential (primary) hypertension: Secondary | ICD-10-CM | POA: Diagnosis not present

## 2021-03-09 DIAGNOSIS — E785 Hyperlipidemia, unspecified: Secondary | ICD-10-CM

## 2021-03-09 DIAGNOSIS — T50905A Adverse effect of unspecified drugs, medicaments and biological substances, initial encounter: Secondary | ICD-10-CM | POA: Diagnosis not present

## 2021-03-09 LAB — CBC WITH DIFFERENTIAL/PLATELET
Basophils Absolute: 0.1 10*3/uL (ref 0.0–0.1)
Basophils Relative: 0.7 % (ref 0.0–3.0)
Eosinophils Absolute: 0.2 10*3/uL (ref 0.0–0.7)
Eosinophils Relative: 2.2 % (ref 0.0–5.0)
HCT: 42.6 % (ref 39.0–52.0)
Hemoglobin: 14.6 g/dL (ref 13.0–17.0)
Lymphocytes Relative: 29.1 % (ref 12.0–46.0)
Lymphs Abs: 2.3 10*3/uL (ref 0.7–4.0)
MCHC: 34.2 g/dL (ref 30.0–36.0)
MCV: 92.7 fl (ref 78.0–100.0)
Monocytes Absolute: 0.8 10*3/uL (ref 0.1–1.0)
Monocytes Relative: 9.4 % (ref 3.0–12.0)
Neutro Abs: 4.7 10*3/uL (ref 1.4–7.7)
Neutrophils Relative %: 58.6 % (ref 43.0–77.0)
Platelets: 149 10*3/uL — ABNORMAL LOW (ref 150.0–400.0)
RBC: 4.6 Mil/uL (ref 4.22–5.81)
RDW: 14.3 % (ref 11.5–15.5)
WBC: 8.1 10*3/uL (ref 4.0–10.5)

## 2021-03-09 LAB — LIPID PANEL
Cholesterol: 120 mg/dL (ref 0–200)
HDL: 63.1 mg/dL (ref >39.00)
LDL Cholesterol: 47 mg/dL (ref 0–99)
NonHDL: 56.52
Total CHOL/HDL Ratio: 2
Triglycerides: 48 mg/dL (ref 0.0–149.0)
VLDL: 9.6 mg/dL (ref 0.0–40.0)

## 2021-03-09 LAB — URINALYSIS, ROUTINE W REFLEX MICROSCOPIC
Bilirubin Urine: NEGATIVE
Hgb urine dipstick: NEGATIVE
Ketones, ur: NEGATIVE
Leukocytes,Ua: NEGATIVE
Nitrite: NEGATIVE
RBC / HPF: NONE SEEN (ref 0–?)
Specific Gravity, Urine: 1.015 (ref 1.000–1.030)
Total Protein, Urine: NEGATIVE
Urine Glucose: NEGATIVE
Urobilinogen, UA: 0.2 (ref 0.0–1.0)
WBC, UA: NONE SEEN (ref 0–?)
pH: 7 (ref 5.0–8.0)

## 2021-03-09 LAB — BASIC METABOLIC PANEL
BUN: 21 mg/dL (ref 6–23)
CO2: 30 mEq/L (ref 19–32)
Calcium: 9 mg/dL (ref 8.4–10.5)
Chloride: 105 mEq/L (ref 96–112)
Creatinine, Ser: 0.97 mg/dL (ref 0.40–1.50)
GFR: 80.01 mL/min (ref >60.00)
Glucose, Bld: 86 mg/dL (ref 70–99)
Potassium: 4 mEq/L (ref 3.5–5.1)
Sodium: 141 mEq/L (ref 135–145)

## 2021-03-09 LAB — TSH: TSH: 4.14 u[IU]/mL (ref 0.35–4.50)

## 2021-03-09 LAB — PSA: PSA: 0.47 ng/mL (ref 0.10–4.00)

## 2021-03-09 MED ORDER — IRBESARTAN 150 MG PO TABS: 150.0000 mg | ORAL_TABLET | Freq: Every day | ORAL | 1 refills | Status: DC

## 2021-03-09 NOTE — Progress Notes (Signed)
Subjective:  Patient ID: Gregory Hale, male    DOB: 02-Nov-1950  Age: 70 y.o. MRN: [id number]  CC: Hypertension and Hyperlipidemia  This visit occurred during the SARS-CoV-2 public health emergency.  Safety protocols were in place, including screening questions prior to the visit, additional usage of staff PPE, and extensive cleaning of exam room while observing appropriate contact time as indicated for disinfecting solutions.    HPI [name] presents for f/up -   He plays golf and walks the course about 4 times a week.  He does not experience CP, DOE, diaphoresis, dizziness, lightheadedness, near-syncope, or syncope.  Outpatient Medications Prior to Visit  Medication Sig Dispense Refill   acetaminophen (TYLENOL) 500 MG tablet Take 500 mg by mouth every 6 (six) hours as needed.     aspirin EC 81 MG tablet Take 1 tablet (81 mg total) by mouth daily. Swallow whole. 90 tablet 3   atorvastatin (LIPITOR) 80 MG tablet TAKE ONE TABLET BY MOUTH EVERY EVENING AT 6 80 tablet 1   celecoxib (CELEBREX) 100 MG capsule TAKE ONE CAPSULE BY MOUTH DAILY 90 capsule 1   metoprolol succinate (TOPROL-XL) 25 MG 24 hr tablet Take 0.5 tablets (12.5 mg total) by mouth daily. 45 tablet 1   nitroGLYCERIN (NITROSTAT) 0.4 MG SL tablet Place 1 tablet (0.4 mg total) under the tongue every 5 (five) minutes as needed. 25 tablet 3   celecoxib (CELEBREX) 200 MG capsule Take 1 capsule (200 mg total) by mouth daily. 90 capsule 1   irbesartan (AVAPRO) 150 MG tablet TAKE ONE TABLET BY MOUTH DAILY 90 tablet 1   No facility-administered medications prior to visit.    ROS Review of Systems  Constitutional:  Negative for diaphoresis and fatigue.  HENT: Negative.    Eyes: Negative.   Respiratory:  Negative for cough, chest tightness, shortness of breath and wheezing.   Cardiovascular:  Negative for chest pain, palpitations and leg swelling.  Gastrointestinal:  Negative for abdominal pain, constipation, diarrhea, nausea  and vomiting.  Endocrine: Negative.   Genitourinary: Negative.  Negative for difficulty urinating and dysuria.  Musculoskeletal:  Negative for arthralgias and myalgias.  Skin: Negative.  Negative for color change.  Neurological: Negative.  Negative for dizziness, weakness, light-headedness and numbness.  Hematological:  Negative for adenopathy. Does not bruise/bleed easily.  Psychiatric/Behavioral: Negative.     Objective:  BP 132/76 (BP Location: Left Arm, Patient Position: Sitting, Cuff Size: Large)   Pulse (!) 53   Temp 98.6 F (37 C) (Oral)   Ht 5\' 8"  (1.727 m)   Wt 190 lb (86.2 kg)   SpO2 99%   BMI 28.89 kg/m   BP Readings from Last 3 Encounters:  03/09/21 132/76  08/10/20 (!) 144/76  06/22/20 136/80    Wt Readings from Last 3 Encounters:  03/09/21 190 lb (86.2 kg)  08/10/20 188 lb (85.3 kg)  06/22/20 188 lb 2 oz (85.3 kg)    Physical Exam Vitals reviewed.  Constitutional:      Appearance: Normal appearance.  HENT:     Nose: Nose normal.     Mouth/Throat:     Mouth: Mucous membranes are moist.  Eyes:     Conjunctiva/sclera: Conjunctivae normal.  Cardiovascular:     Rate and Rhythm: Regular rhythm. Bradycardia present.     Pulses: Normal pulses.     Heart sounds: No murmur heard. Pulmonary:     Effort: Pulmonary effort is normal.     Breath sounds: No stridor. No wheezing, rhonchi or  rales.  Abdominal:     General: Abdomen is flat. Bowel sounds are normal. There is no distension.     Palpations: Abdomen is soft. There is no hepatomegaly, splenomegaly or mass.  Genitourinary:    Comments: He refused a GU/rectal exam. Musculoskeletal:     Cervical back: Neck supple.  Lymphadenopathy:     Cervical: No cervical adenopathy.  Skin:    General: Skin is warm and dry.     Coloration: Skin is not pale.  Neurological:     General: No focal deficit present.     Mental Status: He is alert and oriented to person, place, and time. Mental status is at baseline.     Lab Results  Component Value Date   WBC 8.1 03/09/2021   HGB 14.6 03/09/2021   HCT 42.6 03/09/2021   PLT 149.0 (L) 03/09/2021   GLUCOSE 86 03/09/2021   CHOL 120 03/09/2021   TRIG 48.0 03/09/2021   HDL 63.10 03/09/2021   LDLCALC 47 03/09/2021   ALT 18 03/02/2020   AST 21 03/02/2020   NA 141 03/09/2021   K 4.0 03/09/2021   CL 105 03/09/2021   CREATININE 0.97 03/09/2021   BUN 21 03/09/2021   CO2 30 03/09/2021   TSH 4.14 03/09/2021   PSA 0.47 03/09/2021   HGBA1C 5.6 12/11/2018    MR Angiogram Chest W Wo Contrast  Result Date: 07/27/2020 CLINICAL DATA:  Follow-up thoracic aortic aneurysm. EXAM: MRA CHEST WITH OR WITHOUT CONTRAST TECHNIQUE: Angiographic images of the chest were obtained using MRA technique without and with intravenous contrast. CONTRAST:  8.73mL GADAVIST GADOBUTROL 1 MMOL/ML IV SOLN COMPARISON:  08/07/2019 and CT a 08/10/2017 FINDINGS: VASCULAR Aorta: Aortic root at the sinuses of Valsalva measures roughly 4.4 cm and stable. Maximum size of the ascending thoracic aorta measures up to 4.0 cm and stable. No evidence for an aortic dissection. Typical three-vessel arch anatomy. Proximal great vessels are patent. Proximal descending thoracic aorta measures 3.0 cm and stable. Mid descending thoracic aorta measures 2.7 cm and stable. Distal descending thoracic aorta is tortuous and stable measuring up to 2.5 cm. Heart: Heart size is normal.  No significant pericardial fluid. Pulmonary Arteries: Normal caliber of the main pulmonary arteries. The main pulmonary arteries are patent. Other: Celiac trunk and proximal SMA are patent. Proximal bilateral renal arteries are patent. No gross venous abnormalities. NON-VASCULAR Mediastinal structures are unremarkable. No large pleural effusions. No large incidental lung masses. Incidental imaging of the upper abdomen demonstrates small structures in the left hepatic lobe that likely represent small cysts based on the previous CT examination  from 2018. IMPRESSION: Stable aneurysmal dilatation of the ascending thoracic aorta measuring up to 4.0 cm. Stable enlargement of the aortic root measuring up to 4.4 cm. Recommend annual imaging followup by CTA or MRA. This recommendation follows 2010 ACCF/AHA/AATS/ACR/ASA/SCA/SCAI/SIR/STS/SVM Guidelines for the Diagnosis and Management of Patients with Thoracic Aortic Disease. Circulation. 2010; 121: X323-F573. Aortic aneurysm NOS (ICD10-I71.9) Electronically Signed   By: Markus Daft M.D.   On: 07/27/2020 13:43    Assessment & Plan:   Author was seen today for hypertension and hyperlipidemia.  Diagnoses and all orders for this visit:  Essential hypertension- His blood pressure is adequately well controlled. -     irbesartan (AVAPRO) 150 MG tablet; Take 1 tablet (150 mg total) by mouth daily. -     CBC with Differential/Platelet; Future -     Basic metabolic panel; Future -     TSH; Future -  TSH -     Basic metabolic panel -     CBC with Differential/Platelet  Benign prostatic hyperplasia with weak urinary stream- His PSA is low which is a reassuring sign that he does not have prostate cancer.  He has no symptoms that need to be treated. -     PSA; Future -     Urinalysis, Routine w reflex microscopic; Future -     Urinalysis, Routine w reflex microscopic -     PSA  Bradycardia, drug induced- He is asymptomatic with this.  The work-up is unremarkable.  No intervention is needed. -     TSH; Future -     TSH  CAD in native artery- He is not experiencing angina. -     irbesartan (AVAPRO) 150 MG tablet; Take 1 tablet (150 mg total) by mouth daily. -     Lipid panel; Future -     Lipid panel  Hyperlipidemia LDL goal <70- LDL goal achieved. Doing well on the statin  -     Lipid panel; Future -     TSH; Future -     TSH -     Lipid panel  I have changed Soul Canby's irbesartan. I am also having him maintain his nitroGLYCERIN, aspirin EC, metoprolol succinate, acetaminophen,  celecoxib, and atorvastatin.  Meds ordered this encounter  Medications   irbesartan (AVAPRO) 150 MG tablet    Sig: Take 1 tablet (150 mg total) by mouth daily.    Dispense:  90 tablet    Refill:  1      Follow-up: Return in about 6 months (around 09/08/2021).  Scarlette Calico, MD

## 2021-03-09 NOTE — Patient Instructions (Signed)

## 2021-03-18 ENCOUNTER — Other Ambulatory Visit: Payer: Self-pay | Admitting: Internal Medicine

## 2021-03-18 DIAGNOSIS — I10 Essential (primary) hypertension: Secondary | ICD-10-CM

## 2021-03-18 DIAGNOSIS — I2510 Atherosclerotic heart disease of native coronary artery without angina pectoris: Secondary | ICD-10-CM

## 2021-05-10 ENCOUNTER — Other Ambulatory Visit: Payer: Self-pay | Admitting: Cardiovascular Disease

## 2021-06-04 ENCOUNTER — Other Ambulatory Visit: Payer: Self-pay | Admitting: Internal Medicine

## 2021-06-04 DIAGNOSIS — I10 Essential (primary) hypertension: Secondary | ICD-10-CM

## 2021-06-04 DIAGNOSIS — I2510 Atherosclerotic heart disease of native coronary artery without angina pectoris: Secondary | ICD-10-CM

## 2021-06-14 DIAGNOSIS — Z23 Encounter for immunization: Secondary | ICD-10-CM | POA: Diagnosis not present

## 2021-06-23 ENCOUNTER — Telehealth: Payer: Self-pay | Admitting: Cardiovascular Disease

## 2021-06-23 DIAGNOSIS — I7121 Aneurysm of the ascending aorta, without rupture: Secondary | ICD-10-CM

## 2021-06-23 NOTE — Telephone Encounter (Signed)
  Patient is asking if he will need to do a CT before his 1 year follow up in March. Please advise.

## 2021-07-12 ENCOUNTER — Other Ambulatory Visit: Payer: Self-pay | Admitting: Internal Medicine

## 2021-07-28 NOTE — Telephone Encounter (Signed)
Orders placed for MRA chest/abd/pelvis W/WO contrast for November 2022.  I would like to move up his follow up to December from March.  His yearly follow up is due in November.  I left him a message to call back or reply to the myChart message I am sending so we can reschedule his follow up.

## 2021-07-31 ENCOUNTER — Encounter: Payer: Self-pay | Admitting: Internal Medicine

## 2021-08-02 ENCOUNTER — Other Ambulatory Visit: Payer: Self-pay | Admitting: Internal Medicine

## 2021-08-04 NOTE — Telephone Encounter (Signed)
MR studies scheduled.

## 2021-08-09 ENCOUNTER — Other Ambulatory Visit: Payer: Medicare Other

## 2021-08-09 ENCOUNTER — Other Ambulatory Visit: Payer: Self-pay

## 2021-08-09 DIAGNOSIS — I7121 Aneurysm of the ascending aorta, without rupture: Secondary | ICD-10-CM | POA: Diagnosis not present

## 2021-08-09 LAB — BASIC METABOLIC PANEL
BUN/Creatinine Ratio: 12 (ref 10–24)
BUN: 12 mg/dL (ref 8–27)
CO2: 28 mmol/L (ref 20–29)
Calcium: 9.4 mg/dL (ref 8.6–10.2)
Chloride: 99 mmol/L (ref 96–106)
Creatinine, Ser: 1 mg/dL (ref 0.76–1.27)
Glucose: 131 mg/dL — ABNORMAL HIGH (ref 70–99)
Potassium: 5.2 mmol/L (ref 3.5–5.2)
Sodium: 139 mmol/L (ref 134–144)
eGFR: 81 mL/min/{1.73_m2} (ref >59)

## 2021-08-10 DIAGNOSIS — N5201 Erectile dysfunction due to arterial insufficiency: Secondary | ICD-10-CM | POA: Diagnosis not present

## 2021-08-10 DIAGNOSIS — R351 Nocturia: Secondary | ICD-10-CM | POA: Diagnosis not present

## 2021-08-23 ENCOUNTER — Encounter: Payer: Self-pay | Admitting: Internal Medicine

## 2021-08-24 ENCOUNTER — Other Ambulatory Visit (HOSPITAL_COMMUNITY): Payer: Medicare Other

## 2021-08-27 ENCOUNTER — Ambulatory Visit (HOSPITAL_COMMUNITY)
Admission: RE | Admit: 2021-08-27 | Discharge: 2021-08-27 | Disposition: A | Discharge status: Home / Self Care | Payer: Medicare Other | Source: Ambulatory Visit | Attending: Cardiovascular Disease | Admitting: Cardiovascular Disease

## 2021-08-27 ENCOUNTER — Ambulatory Visit (HOSPITAL_COMMUNITY): Payer: Medicare Other

## 2021-08-27 ENCOUNTER — Other Ambulatory Visit: Payer: Self-pay

## 2021-08-27 ENCOUNTER — Encounter (HOSPITAL_COMMUNITY): Payer: Self-pay

## 2021-08-27 DIAGNOSIS — I700 Atherosclerosis of aorta: Secondary | ICD-10-CM | POA: Insufficient documentation

## 2021-08-27 DIAGNOSIS — Q2546 Tortuous aortic arch: Secondary | ICD-10-CM | POA: Diagnosis not present

## 2021-08-27 DIAGNOSIS — M4186 Other forms of scoliosis, lumbar region: Secondary | ICD-10-CM | POA: Insufficient documentation

## 2021-08-27 DIAGNOSIS — K7689 Other specified diseases of liver: Secondary | ICD-10-CM | POA: Insufficient documentation

## 2021-08-27 DIAGNOSIS — I7121 Aneurysm of the ascending aorta, without rupture: Secondary | ICD-10-CM | POA: Diagnosis not present

## 2021-08-27 DIAGNOSIS — D3502 Benign neoplasm of left adrenal gland: Secondary | ICD-10-CM | POA: Diagnosis not present

## 2021-08-27 DIAGNOSIS — I712 Thoracic aortic aneurysm, without rupture, unspecified: Secondary | ICD-10-CM | POA: Diagnosis not present

## 2021-08-27 MED ORDER — GADOBUTROL 1 MMOL/ML IV SOLN
8.0000 mL | Freq: Once | INTRAVENOUS | Status: AC | PRN
  Administered 2021-08-27: 8 mL via INTRAVENOUS

## 2021-08-30 ENCOUNTER — Other Ambulatory Visit: Payer: Self-pay

## 2021-08-30 ENCOUNTER — Ambulatory Visit (INDEPENDENT_AMBULATORY_CARE_PROVIDER_SITE_OTHER): Payer: Medicare Other | Admitting: Internal Medicine

## 2021-08-30 ENCOUNTER — Encounter: Payer: Self-pay | Admitting: Internal Medicine

## 2021-08-30 ENCOUNTER — Ambulatory Visit (INDEPENDENT_AMBULATORY_CARE_PROVIDER_SITE_OTHER): Payer: Medicare Other

## 2021-08-30 VITALS — BP 136/82 | HR 58 | Temp 98.0°F | Resp 16 | Ht 68.0 in | Wt 191.0 lb

## 2021-08-30 DIAGNOSIS — D696 Thrombocytopenia, unspecified: Secondary | ICD-10-CM | POA: Diagnosis not present

## 2021-08-30 DIAGNOSIS — G8929 Other chronic pain: Secondary | ICD-10-CM | POA: Diagnosis not present

## 2021-08-30 DIAGNOSIS — R739 Hyperglycemia, unspecified: Secondary | ICD-10-CM | POA: Diagnosis not present

## 2021-08-30 DIAGNOSIS — E039 Hypothyroidism, unspecified: Secondary | ICD-10-CM

## 2021-08-30 DIAGNOSIS — I2510 Atherosclerotic heart disease of native coronary artery without angina pectoris: Secondary | ICD-10-CM | POA: Diagnosis not present

## 2021-08-30 DIAGNOSIS — M545 Low back pain, unspecified: Secondary | ICD-10-CM

## 2021-08-30 DIAGNOSIS — I10 Essential (primary) hypertension: Secondary | ICD-10-CM | POA: Diagnosis not present

## 2021-08-30 DIAGNOSIS — M2578 Osteophyte, vertebrae: Secondary | ICD-10-CM | POA: Diagnosis not present

## 2021-08-30 DIAGNOSIS — M159 Polyosteoarthritis, unspecified: Secondary | ICD-10-CM

## 2021-08-30 LAB — BASIC METABOLIC PANEL
BUN: 20 mg/dL (ref 6–23)
CO2: 30 mEq/L (ref 19–32)
Calcium: 9.1 mg/dL (ref 8.4–10.5)
Chloride: 104 mEq/L (ref 96–112)
Creatinine, Ser: 1.05 mg/dL (ref 0.40–1.50)
GFR: 72.51 mL/min (ref >60.00)
Glucose, Bld: 91 mg/dL (ref 70–99)
Potassium: 4.3 mEq/L (ref 3.5–5.1)
Sodium: 141 mEq/L (ref 135–145)

## 2021-08-30 LAB — HEPATIC FUNCTION PANEL
ALT: 16 U/L (ref 0–53)
AST: 22 U/L (ref 0–37)
Albumin: 4.1 g/dL (ref 3.5–5.2)
Alkaline Phosphatase: 70 U/L (ref 39–117)
Bilirubin, Direct: 0.3 mg/dL (ref 0.0–0.3)
Total Bilirubin: 1.6 mg/dL — ABNORMAL HIGH (ref 0.2–1.2)
Total Protein: 6.7 g/dL (ref 6.0–8.3)

## 2021-08-30 LAB — CBC WITH DIFFERENTIAL/PLATELET
Basophils Absolute: 0 10*3/uL (ref 0.0–0.1)
Basophils Relative: 0.6 % (ref 0.0–3.0)
Eosinophils Absolute: 0.1 10*3/uL (ref 0.0–0.7)
Eosinophils Relative: 1.9 % (ref 0.0–5.0)
HCT: 44.1 % (ref 39.0–52.0)
Hemoglobin: 14.9 g/dL (ref 13.0–17.0)
Lymphocytes Relative: 23.4 % (ref 12.0–46.0)
Lymphs Abs: 1.8 10*3/uL (ref 0.7–4.0)
MCHC: 33.8 g/dL (ref 30.0–36.0)
MCV: 94 fl (ref 78.0–100.0)
Monocytes Absolute: 0.9 10*3/uL (ref 0.1–1.0)
Monocytes Relative: 11.7 % (ref 3.0–12.0)
Neutro Abs: 4.7 10*3/uL (ref 1.4–7.7)
Neutrophils Relative %: 62.4 % (ref 43.0–77.0)
Platelets: 146 10*3/uL — ABNORMAL LOW (ref 150.0–400.0)
RBC: 4.69 Mil/uL (ref 4.22–5.81)
RDW: 13.6 % (ref 11.5–15.5)
WBC: 7.6 10*3/uL (ref 4.0–10.5)

## 2021-08-30 LAB — HEMOGLOBIN A1C: Hgb A1c MFr Bld: 5.7 % (ref 4.6–6.5)

## 2021-08-30 LAB — VITAMIN B12: Vitamin B-12: 301 pg/mL (ref 211–911)

## 2021-08-30 LAB — FOLATE: Folate: 15 ng/mL (ref >5.9)

## 2021-08-30 LAB — TSH: TSH: 3.87 u[IU]/mL (ref 0.35–5.50)

## 2021-08-30 MED ORDER — CELECOXIB 100 MG PO CAPS: 100.0000 mg | ORAL_CAPSULE | Freq: Every day | ORAL | 1 refills | Status: DC

## 2021-08-30 NOTE — Progress Notes (Signed)
Subjective:  Patient ID: Gregory Hale, male    DOB: 05-30-1951  Age: 70 y.o. MRN: [id number]  CC: Back Pain  This visit occurred during the SARS-CoV-2 public health emergency.  Safety protocols were in place, including screening questions prior to the visit, additional usage of staff PPE, and extensive cleaning of exam room while observing appropriate contact time as indicated for disinfecting solutions.    HPI Bowdy Bair presents for f/up -   He complains of a 3-week history of low back pain.  He describes a soreness in his low back and a sharp pain in the tailbone.  The pain does not radiate into his lower extremities.  He has gotten some symptom relief with Motrin.  He denies trauma or injury.  He has had no paresthesias.  He has been playing golf and does not experience chest pain, shortness of breath, diaphoresis, dizziness, lightheadedness, or edema.  Outpatient Medications Prior to Visit  Medication Sig Dispense Refill   acetaminophen (TYLENOL) 500 MG tablet Take 500 mg by mouth every 6 (six) hours as needed.     aspirin EC 81 MG tablet Take 1 tablet (81 mg total) by mouth daily. Swallow whole. 90 tablet 3   atorvastatin (LIPITOR) 80 MG tablet Take 1 tablet (80 mg total) by mouth every evening. Please call our office to schedule an appt for future refills. [phone] Thank you 90 tablet 0   irbesartan (AVAPRO) 150 MG tablet TAKE ONE TABLET BY MOUTH DAILY 90 tablet 1   metoprolol succinate (TOPROL-XL) 25 MG 24 hr tablet TAKE 1/2 TABLET BY MOUTH DAILY 90 tablet 1   nitroGLYCERIN (NITROSTAT) 0.4 MG SL tablet Place 1 tablet (0.4 mg total) under the tongue every 5 (five) minutes as needed. 25 tablet 3   celecoxib (CELEBREX) 100 MG capsule TAKE ONE CAPSULE BY MOUTH DAILY 90 capsule 0   No facility-administered medications prior to visit.    ROS Review of Systems  Constitutional:  Negative for diaphoresis and fatigue.  HENT: Negative.    Eyes: Negative.   Respiratory:  Negative  for cough, chest tightness, shortness of breath and wheezing.   Cardiovascular:  Negative for chest pain, palpitations and leg swelling.  Gastrointestinal:  Negative for abdominal pain, constipation and diarrhea.  Endocrine: Negative.   Genitourinary: Negative.  Negative for difficulty urinating.  Musculoskeletal:  Positive for arthralgias and back pain. Negative for myalgias and neck pain.  Skin: Negative.   Neurological:  Negative for dizziness, weakness, light-headedness, numbness and headaches.  Hematological:  Negative for adenopathy. Does not bruise/bleed easily.  Psychiatric/Behavioral: Negative.     Objective:  BP 136/82 (BP Location: Left Arm, Patient Position: Sitting, Cuff Size: Large)   Pulse (!) 58   Temp 98 F (36.7 C) (Oral)   Resp 16   Ht 5\' 8"  (1.727 m)   Wt 191 lb (86.6 kg)   SpO2 98%   BMI 29.04 kg/m   BP Readings from Last 3 Encounters:  08/30/21 136/82  03/09/21 132/76  08/10/20 (!) 144/76    Wt Readings from Last 3 Encounters:  08/30/21 191 lb (86.6 kg)  03/09/21 190 lb (86.2 kg)  08/10/20 188 lb (85.3 kg)    Physical Exam Vitals reviewed.  HENT:     Nose: Nose normal.     Mouth/Throat:     Mouth: Mucous membranes are moist.  Eyes:     General: No scleral icterus.    Conjunctiva/sclera: Conjunctivae normal.  Cardiovascular:     Rate and Rhythm:  Normal rate and regular rhythm.     Heart sounds: No murmur heard. Pulmonary:     Effort: Pulmonary effort is normal.     Breath sounds: No stridor. No wheezing, rhonchi or rales.  Abdominal:     General: Abdomen is flat.     Palpations: There is no mass.     Tenderness: There is no abdominal tenderness. There is no guarding.     Hernia: No hernia is present.  Musculoskeletal:        General: Normal range of motion.     Cervical back: Normal and neck supple.     Thoracic back: Normal.     Lumbar back: Normal. No deformity, tenderness or bony tenderness. Normal range of motion. Negative right  straight leg raise test and negative left straight leg raise test.     Right lower leg: No edema.     Left lower leg: No edema.  Lymphadenopathy:     Cervical: No cervical adenopathy.  Skin:    General: Skin is dry.  Neurological:     General: No focal deficit present.     Mental Status: He is alert.  Psychiatric:        Mood and Affect: Mood normal.        Behavior: Behavior normal.    Lab Results  Component Value Date   WBC 7.6 08/30/2021   HGB 14.9 08/30/2021   HCT 44.1 08/30/2021   PLT 146.0 (L) 08/30/2021   GLUCOSE 91 08/30/2021   CHOL 120 03/09/2021   TRIG 48.0 03/09/2021   HDL 63.10 03/09/2021   LDLCALC 47 03/09/2021   ALT 16 08/30/2021   AST 22 08/30/2021   NA 141 08/30/2021   K 4.3 08/30/2021   CL 104 08/30/2021   CREATININE 1.05 08/30/2021   BUN 20 08/30/2021   CO2 30 08/30/2021   TSH 3.87 08/30/2021   PSA 0.47 03/09/2021   HGBA1C 5.7 08/30/2021    MR ANGIO ABDOMEN W WO CONTRAST  Result Date: 08/27/2021 CLINICAL DATA:  Thoracic aortic aneurysm follow-up EXAM: MRA CHEST ABDOMEN AND PELVIS WITH CONTRAST TECHNIQUE: Multiplanar, multiecho pulse sequences of the chest abdomen and pelvis were obtained with intravenous contrast. Angiographic images of abdomen and pelvis were obtained using MRA technique with intravenous contrast. CONTRAST:  53mL GADAVIST GADOBUTROL 1 MMOL/ML IV SOLN COMPARISON:  None. FINDINGS: MRA CHEST Vascular Mild fusiform aneurysmal dilation of the tubular portion of the ascending thoracic aorta with a maximal diameter of 4.1 cm. This is perhaps incrementally larger compared to 4 cm measured previously. The aortic root remains normal in size as does the transverse and descending thoracic aorta. The descending thoracic aorta is tortuous. Conventional 3 vessel arch anatomy. No evidence of dissection. The heart is normal in size. Unremarkable main pulmonary artery. Non Vascular No focal signal abnormality or abnormal enhancement within the lungs, pleura,  mediastinum or musculoskeletal structures. No pleural effusion. MRA ABDOMEN Vascular Aorta: No evidence of aneurysm or dissection. Celiac: Widely patent. No evidence of aneurysm, dissection or significant stenosis. SMA: Widely patent. No evidence of aneurysm, dissection or significant stenosis. Renals: Solitary renal arteries without evidence of aneurysm, dissection, significant stenosis or FMD. IMA: Patent without evidence of stenosis, dissection or aneurysm. Veins: No focal abnormalities. Non Vascular Multiple small circumscribed T2 hyperintense T1 hypointense nonenhancing lesions scattered throughout the liver consistent with small simple cysts. Stable 2.2 cm left adrenal adenoma dating back to November of 2018. Dextroconvex scoliosis of the lumbar spine with multilevel degenerative disc disease. Otherwise,  no focal signal abnormality or abnormal enhancement involving the spleen, pancreas, kidneys, stomach, bowel, or lower genitourinary tract. MRA PELVIS Vascular Inflow: Tortuous with but patent. No evidence of aneurysm, dissection, stenosis or occlusion. Proximal Outflow: No evidence of aneurysm, dissection, stenosis or occlusion. Atherosclerotic plaque visualized along the posterior walls of the common femoral arteries bilaterally. Veins: Patent.  No focal abnormality. Non Vascular No signal abnormality or abnormal enhancement. IMPRESSION: 1. Essentially stable mild aneurysmal dilation of the ascending thoracic aorta with a maximal diameter of 4.1 cm. Four year stability is reassuring. This aortic diameter may be normal for the patient's body surface area. Consider follow-up surveillance imaging every other year. 2. Stable ectasia of the aortic root at 4.4 cm. 3. The distal descending thoracic aorta and upper abdominal aorta are tortuous but nonaneurysmal. 4. No evidence of abdominal aortic, iliac or visceral artery aneurysm. 5. Mild atherosclerotic plaque in the aorta and bilateral common femoral arteries  without significant stenosis. 6. Dextroconvex scoliosis of the lumbar spine with associated multilevel degenerative disc disease. 7. Small benign hepatic cysts. 8. Stable left adrenal adenoma. Signed, Criselda Peaches, MD, [place] [facility] Radiology Electronically Signed   By: Jacqulynn Cadet M.D.   On: 08/27/2021 13:24   MR Angiogram Chest W Wo Contrast  Result Date: 08/27/2021 CLINICAL DATA:  Thoracic aortic aneurysm follow-up EXAM: MRA CHEST ABDOMEN AND PELVIS WITH CONTRAST TECHNIQUE: Multiplanar, multiecho pulse sequences of the chest abdomen and pelvis were obtained with intravenous contrast. Angiographic images of abdomen and pelvis were obtained using MRA technique with intravenous contrast. CONTRAST:  47mL GADAVIST GADOBUTROL 1 MMOL/ML IV SOLN COMPARISON:  None. FINDINGS: MRA CHEST Vascular Mild fusiform aneurysmal dilation of the tubular portion of the ascending thoracic aorta with a maximal diameter of 4.1 cm. This is perhaps incrementally larger compared to 4 cm measured previously. The aortic root remains normal in size as does the transverse and descending thoracic aorta. The descending thoracic aorta is tortuous. Conventional 3 vessel arch anatomy. No evidence of dissection. The heart is normal in size. Unremarkable main pulmonary artery. Non Vascular No focal signal abnormality or abnormal enhancement within the lungs, pleura, mediastinum or musculoskeletal structures. No pleural effusion. MRA ABDOMEN Vascular Aorta: No evidence of aneurysm or dissection. Celiac: Widely patent. No evidence of aneurysm, dissection or significant stenosis. SMA: Widely patent. No evidence of aneurysm, dissection or significant stenosis. Renals: Solitary renal arteries without evidence of aneurysm, dissection, significant stenosis or FMD. IMA: Patent without evidence of stenosis, dissection or aneurysm. Veins: No focal abnormalities. Non Vascular Multiple small  circumscribed T2 hyperintense T1 hypointense nonenhancing lesions scattered throughout the liver consistent with small simple cysts. Stable 2.2 cm left adrenal adenoma dating back to November of 2018. Dextroconvex scoliosis of the lumbar spine with multilevel degenerative disc disease. Otherwise, no focal signal abnormality or abnormal enhancement involving the spleen, pancreas, kidneys, stomach, bowel, or lower genitourinary tract. MRA PELVIS Vascular Inflow: Tortuous with but patent. No evidence of aneurysm, dissection, stenosis or occlusion. Proximal Outflow: No evidence of aneurysm, dissection, stenosis or occlusion. Atherosclerotic plaque visualized along the posterior walls of the common femoral arteries bilaterally. Veins: Patent.  No focal abnormality. Non Vascular No signal abnormality or abnormal enhancement. IMPRESSION: 1. Essentially stable mild aneurysmal dilation of the ascending thoracic aorta with a maximal diameter of 4.1 cm. Four year stability is reassuring. This aortic diameter may be normal for the patient's body surface area. Consider follow-up surveillance imaging every other year. 2. Stable ectasia of  the aortic root at 4.4 cm. 3. The distal descending thoracic aorta and upper abdominal aorta are tortuous but nonaneurysmal. 4. No evidence of abdominal aortic, iliac or visceral artery aneurysm. 5. Mild atherosclerotic plaque in the aorta and bilateral common femoral arteries without significant stenosis. 6. Dextroconvex scoliosis of the lumbar spine with associated multilevel degenerative disc disease. 7. Small benign hepatic cysts. 8. Stable left adrenal adenoma. Signed, Criselda Peaches, MD, [place] [facility] Radiology Electronically Signed   By: Jacqulynn Cadet M.D.   On: 08/27/2021 13:24   MR ANGIO PELVIS W WO CONTRAST  Result Date: 08/27/2021 CLINICAL DATA:  Thoracic aortic aneurysm follow-up EXAM: MRA CHEST ABDOMEN AND PELVIS WITH  CONTRAST TECHNIQUE: Multiplanar, multiecho pulse sequences of the chest abdomen and pelvis were obtained with intravenous contrast. Angiographic images of abdomen and pelvis were obtained using MRA technique with intravenous contrast. CONTRAST:  16mL GADAVIST GADOBUTROL 1 MMOL/ML IV SOLN COMPARISON:  None. FINDINGS: MRA CHEST Vascular Mild fusiform aneurysmal dilation of the tubular portion of the ascending thoracic aorta with a maximal diameter of 4.1 cm. This is perhaps incrementally larger compared to 4 cm measured previously. The aortic root remains normal in size as does the transverse and descending thoracic aorta. The descending thoracic aorta is tortuous. Conventional 3 vessel arch anatomy. No evidence of dissection. The heart is normal in size. Unremarkable main pulmonary artery. Non Vascular No focal signal abnormality or abnormal enhancement within the lungs, pleura, mediastinum or musculoskeletal structures. No pleural effusion. MRA ABDOMEN Vascular Aorta: No evidence of aneurysm or dissection. Celiac: Widely patent. No evidence of aneurysm, dissection or significant stenosis. SMA: Widely patent. No evidence of aneurysm, dissection or significant stenosis. Renals: Solitary renal arteries without evidence of aneurysm, dissection, significant stenosis or FMD. IMA: Patent without evidence of stenosis, dissection or aneurysm. Veins: No focal abnormalities. Non Vascular Multiple small circumscribed T2 hyperintense T1 hypointense nonenhancing lesions scattered throughout the liver consistent with small simple cysts. Stable 2.2 cm left adrenal adenoma dating back to November of 2018. Dextroconvex scoliosis of the lumbar spine with multilevel degenerative disc disease. Otherwise, no focal signal abnormality or abnormal enhancement involving the spleen, pancreas, kidneys, stomach, bowel, or lower genitourinary tract. MRA PELVIS Vascular Inflow: Tortuous with but patent. No evidence of aneurysm, dissection,  stenosis or occlusion. Proximal Outflow: No evidence of aneurysm, dissection, stenosis or occlusion. Atherosclerotic plaque visualized along the posterior walls of the common femoral arteries bilaterally. Veins: Patent.  No focal abnormality. Non Vascular No signal abnormality or abnormal enhancement. IMPRESSION: 1. Essentially stable mild aneurysmal dilation of the ascending thoracic aorta with a maximal diameter of 4.1 cm. Four year stability is reassuring. This aortic diameter may be normal for the patient's body surface area. Consider follow-up surveillance imaging every other year. 2. Stable ectasia of the aortic root at 4.4 cm. 3. The distal descending thoracic aorta and upper abdominal aorta are tortuous but nonaneurysmal. 4. No evidence of abdominal aortic, iliac or visceral artery aneurysm. 5. Mild atherosclerotic plaque in the aorta and bilateral common femoral arteries without significant stenosis. 6. Dextroconvex scoliosis of the lumbar spine with associated multilevel degenerative disc disease. 7. Small benign hepatic cysts. 8. Stable left adrenal adenoma. Signed, Criselda Peaches, MD, [place] [facility] Radiology Electronically Signed   By: Jacqulynn Cadet M.D.   On: 08/27/2021 13:24   DG Lumbar Spine Complete  Result Date: 08/31/2021 CLINICAL DATA:  Low back pain. EXAM: LUMBAR SPINE - COMPLETE 4+  VIEW COMPARISON:  None. FINDINGS: No acute fracture or subluxation. Multilevel degenerative changes with osteophyte and scoliosis. Multilevel facet arthropathy. Atherosclerotic calcification of the aorta. IMPRESSION: No acute fracture or subluxation. Electronically Signed   By: Anner Crete M.D.   On: 08/31/2021 01:00     Assessment & Plan:   Crystian was seen today for back pain.  Diagnoses and all orders for this visit:  Acquired hypothyroidism-TSH is in the normal range. -     TSH; Future -     TSH  Essential hypertension- His blood  pressure is adequately well controlled. -     Hepatic function panel; Future -     TSH; Future -     TSH -     Hepatic function panel  Thrombocytopenia ([place])- Platelets are stable and there is no history of bleeding or bruising.  B12 and folate are normal.  This is benign.   -     Vitamin B12; Future -     CBC with Differential/Platelet; Future -     Hepatic function panel; Future -     Folate; Future -     Folate -     Hepatic function panel -     CBC with Differential/Platelet -     Vitamin B12  Chronic hyperglycemia- His A1C is normal. -     Basic metabolic panel; Future -     Hepatic function panel; Future -     Hemoglobin A1c; Future -     Hemoglobin A1c -     Hepatic function panel -     Basic metabolic panel  Primary osteoarthritis involving multiple joints- Will increase Celebrex to twice a day.  I have asked him not to take ibuprofen with Celebrex. -     Discontinue: celecoxib (CELEBREX) 100 MG capsule; Take 1 capsule (100 mg total) by mouth daily. -     celecoxib (CELEBREX) 100 MG capsule; Take 1 capsule (100 mg total) by mouth 2 (two) times daily.  Chronic bilateral low back pain without sciatica -     DG Lumbar Spine Complete; Future -     celecoxib (CELEBREX) 100 MG capsule; Take 1 capsule (100 mg total) by mouth 2 (two) times daily.  I have discontinued Broadus John Larner's celecoxib. I have also changed his celecoxib. Additionally, I am having him maintain his nitroGLYCERIN, aspirin EC, acetaminophen, metoprolol succinate, atorvastatin, and irbesartan.  Meds ordered this encounter  Medications   DISCONTD: celecoxib (CELEBREX) 100 MG capsule    Sig: Take 1 capsule (100 mg total) by mouth daily.    Dispense:  90 capsule    Refill:  1   celecoxib (CELEBREX) 100 MG capsule    Sig: Take 1 capsule (100 mg total) by mouth 2 (two) times daily.    Dispense:  180 capsule    Refill:  1     Follow-up: Return in about 3 months (around 11/28/2021).  Scarlette Calico, MD

## 2021-08-30 NOTE — Patient Instructions (Signed)

## 2021-08-31 ENCOUNTER — Encounter: Payer: Self-pay | Admitting: Internal Medicine

## 2021-08-31 DIAGNOSIS — M545 Low back pain, unspecified: Secondary | ICD-10-CM

## 2021-08-31 DIAGNOSIS — M159 Polyosteoarthritis, unspecified: Secondary | ICD-10-CM

## 2021-08-31 DIAGNOSIS — G8929 Other chronic pain: Secondary | ICD-10-CM

## 2021-09-01 ENCOUNTER — Other Ambulatory Visit: Payer: Self-pay | Admitting: Internal Medicine

## 2021-09-01 DIAGNOSIS — M545 Low back pain, unspecified: Secondary | ICD-10-CM

## 2021-09-01 DIAGNOSIS — M159 Polyosteoarthritis, unspecified: Secondary | ICD-10-CM

## 2021-09-01 DIAGNOSIS — G8929 Other chronic pain: Secondary | ICD-10-CM

## 2021-09-01 MED ORDER — CELECOXIB 100 MG PO CAPS: 100.0000 mg | ORAL_CAPSULE | Freq: Two times a day (BID) | ORAL | 1 refills | Status: DC

## 2021-09-03 ENCOUNTER — Other Ambulatory Visit: Payer: Self-pay | Admitting: Cardiovascular Disease

## 2021-09-03 MED ORDER — CELECOXIB 100 MG PO CAPS: 100.0000 mg | ORAL_CAPSULE | Freq: Two times a day (BID) | ORAL | 1 refills | Status: DC

## 2021-09-06 ENCOUNTER — Telehealth: Payer: Self-pay | Admitting: Internal Medicine

## 2021-09-06 NOTE — Telephone Encounter (Signed)
[facility] pharmacist called to verify whether was okay to fill. Allergy alert flagged them when going to fill the order. Requesting a callback for verbal authorization to fill order.   Best contact #: [phone] opt: 2; ref #: [id number]

## 2021-09-07 NOTE — Telephone Encounter (Signed)
Verbal order given stating it is ok to fill Celebrex Rx. Per PCP pt has been taking Rx but now at a higher dose. Pharmacist expressed understanding.

## 2021-09-09 NOTE — Progress Notes (Signed)
Chief Complaint  Patient presents with   Follow-up    CAD   History of Present Illness: 70 yo male with history of CAD, former tobacco abuse, OA and thoracic aortic aneurysm here today for cardiac follow up. He was admitted to [facility] 04/22/17 with an acute inferior STEMI. His RCA was occluded and was treated with a drug eluting stent. There was mild disease noted in the LAD and moderate disease in the Diagonal branch. LVEF=65%. Echo 04/23/17 with normal LV systolic function, SWNI=62-70%, no valve disease, mild dilation of aortic root. He has also been followed for mild dilation of the ascending aorta. Most recent chest MRA December 2022 with stable 4.1 cm ascending aortic aneurysm and no evidence of abdominal aortic aneurysm.   He is here today for follow up. The patient denies any chest pain, dyspnea, palpitations, lower extremity edema, orthopnea, PND, dizziness, near syncope or syncope. He is very active and plays golf 3-4 days per week.    Primary Care Physician: Janith Lima, MD  Past Medical History:  Diagnosis Date   Arthritis    Ascending aorta dilatation (HCC)    Bradycardia    a. HR 40 in setting of acute inferior STEMI 03/2017.   CAD in native artery    a. inf STEMI 03/2017 a/w hypotension and bradycardia -> s/p DES to RCA, otherwise mild disease in LAD, moderate disease in ostium of the moderate caliber diagonal branch, normal LVEF >65%   Dilated aortic root (HCC)    Heart attack ([place])    STEMI involving right coronary artery ([place])    04/22/17 PCI/DES to mRCA, normal EF   Tobacco abuse    Quit in 1998    Past Surgical History:  Procedure Laterality Date   CORONARY STENT INTERVENTION N/A 04/22/2017   Procedure: Coronary Stent Intervention;  Surgeon: [name], MD;  Location: [place] [facility];  Service: Cardiovascular;  Laterality: N/A;   LEFT HEART CATH AND CORONARY ANGIOGRAPHY N/A 04/22/2017   Procedure: Left Heart Cath and Coronary Angiography;  Surgeon:  [name], MD;  Location: [place] [facility];  Service: Cardiovascular;  Laterality: N/A;   parotid Left 1980   TONSILLECTOMY     as a child    Current Outpatient Medications  Medication Sig Dispense Refill   acetaminophen (TYLENOL) 500 MG tablet Take 500 mg by mouth every 6 (six) hours as needed.     aspirin EC 81 MG tablet Take 1 tablet (81 mg total) by mouth daily. Swallow whole. 90 tablet 3   atorvastatin (LIPITOR) 80 MG tablet Take 1 tablet (80 mg total) by mouth every evening. Please keep upcoming appt in December 2022 with Cardiologist before anymore refills. Thank you 30 tablet 0   celecoxib (CELEBREX) 100 MG capsule Take 1 capsule (100 mg total) by mouth 2 (two) times daily. 180 capsule 1   irbesartan (AVAPRO) 150 MG tablet TAKE ONE TABLET BY MOUTH DAILY 90 tablet 1   metoprolol succinate (TOPROL-XL) 25 MG 24 hr tablet TAKE 1/2 TABLET BY MOUTH DAILY 90 tablet 1   nitroGLYCERIN (NITROSTAT) 0.4 MG SL tablet Place 1 tablet (0.4 mg total) under the tongue every 5 (five) minutes as needed. 25 tablet 3   No current facility-administered medications for this visit.    Allergies  Allergen Reactions   Advil [Ibuprofen] Other (See Comments)    bleeding    Social History   Socioeconomic History   Marital status: Married    Spouse name:  Not on file   Number of children: 1   Years of education: Not on file   Highest education level: Not on file  Occupational History   Occupation: retired  Tobacco Use   Smoking status: Former    Types: Cigarettes    Quit date: [place] since quitting: 32.9   Smokeless tobacco: Never  Vaping Use   Vaping Use: Never used  Substance and Sexual Activity   Alcohol use: Yes    Alcohol/week: 10.0 standard drinks    Types: 10 Cans of beer per week    Comment: .5 per day   Drug use: No   Sexual activity: Yes    Partners: Female  Other Topics Concern   Not on file  Social History Narrative   Not on file   Social  Determinants of Health   Financial Resource Strain: Not on file  Food Insecurity: Not on file  Transportation Needs: Not on file  Physical Activity: Not on file  Stress: Not on file  Social Connections: Not on file  Intimate Partner Violence: Not on file    Family History  Problem Relation Age of Onset   CAD Brother    Diabetes Brother    Rheumatic fever Father    Other Father        rheumatic heart   Pancreatic cancer Sister    Colon cancer Neg Hx    Esophageal cancer Neg Hx    Stomach cancer Neg Hx    Rectal cancer Neg Hx     Review of Systems:  As stated in the HPI and otherwise negative.   BP (!) 142/82    Pulse (!) 50    Ht 5\' 8"  (1.727 m)    Wt 190 lb 12.8 oz (86.5 kg)    SpO2 98%    BMI 29.01 kg/m   Physical Examination:  General: Well developed, well nourished, NAD  HEENT: OP clear, mucus membranes moist  SKIN: warm, dry. No rashes. Neuro: No focal deficits  Musculoskeletal: Muscle strength 5/5 all ext  Psychiatric: Mood and affect normal  Neck: No JVD, no carotid bruits, no thyromegaly, no lymphadenopathy.  Lungs:Clear bilaterally, no wheezes, rhonci, crackles Cardiovascular: Regular rate and rhythm. No murmurs, gallops or rubs. Abdomen:Soft. Bowel sounds present. Non-tender.  Extremities: No lower extremity edema. Pulses are 2 + in the bilateral DP/PT.  Echo 04/23/17: - Left ventricle: The cavity size was normal. Wall thickness was   increased in a pattern of mild LVH. Systolic function was normal.   The estimated ejection fraction was in the range of 60% to 65%.   Wall motion was normal; there were no regional wall motion   abnormalities. Doppler parameters are consistent with abnormal   left ventricular relaxation (grade 1 diastolic dysfunction). - Aortic root: The aortic root was mildly dilated. - Ascending aorta: The ascending aorta was mildly dilated. - Mitral valve: There was trivial regurgitation. - Right atrium: Central venous pressure (est): 3  mm Hg. - Atrial septum: No defect or patent foramen ovale was identified. - Tricuspid valve: There was mild regurgitation. - Pulmonary arteries: PA peak pressure: 24 mm Hg (S). - Pericardium, extracardiac: There was no pericardial effusion.   Impressions:   - Mild LVH with LVEF 60-65% and grade 1 diastolic dysfunction.   Trivial mitral regurgitation. Mildly dilated aortic root and   ascending aorta. Mild tricuspid regurgitation with PASP 24 mmHg.  Cardiac cath 04/22/17: Mid LAD lesion, 30 %stenosed. Ost 2nd Diag lesion,  70 %stenosed. A STENT RESOLUTE ONYX 5.0X18 drug eluting stent was successfully placed. Prox RCA lesion, 100 %stenosed. Post intervention, there is a 0% residual stenosis. The left ventricular systolic function is normal. LV end diastolic pressure is normal. The left ventricular ejection fraction is greater than 65% by visual estimate. There is no mitral valve regurgitation.  Diagnostic Diagram       Post-Intervention Diagram          EKG:  EKG is ordered today. The ekg ordered today demonstrates sinus brady  Recent Labs: 08/30/2021: ALT 16; BUN 20; Creatinine, Ser 1.05; Hemoglobin 14.9; Platelets 146.0; Potassium 4.3; Sodium 141; TSH 3.87   Lipid Panel    Component Value Date/Time   CHOL 120 03/09/2021 1447   CHOL 106 05/22/2017 0814   CHOL 179 06/24/2011 0000   TRIG 48.0 03/09/2021 1447   TRIG 63 06/24/2011 0000   HDL 63.10 03/09/2021 1447   HDL 53 05/22/2017 0814   CHOLHDL 2 03/09/2021 1447   VLDL 9.6 03/09/2021 1447   LDLCALC 47 03/09/2021 1447   LDLCALC 45 05/22/2017 0814     Wt Readings from Last 3 Encounters:  09/10/21 190 lb 12.8 oz (86.5 kg)  08/30/21 191 lb (86.6 kg)  03/09/21 190 lb (86.2 kg)     Other studies Reviewed: Additional studies/ records that were reviewed today include: . Review of the above records demonstrates:    Assessment and Plan:   1. CAD without angina: No chest pain. BP controlled. LDL at goal in June  2022. Continue ASA, statin and beta blocker.   2. Thoracic aortic aneurysm: Chest MRA December 2022 with stable 4.1 cm ascending aortic aneurysm.    Current medicines are reviewed at length with the patient today.  The patient does not have concerns regarding medicines.  The following changes have been made:  no change  Labs/ tests ordered today include:   Orders Placed This Encounter  Procedures   EKG 12-Lead    Disposition:   F/U with me in 12 months  Signed, Lauree Chandler, MD 09/10/2021 [facility] [address], [place], [place]  [address] Phone: [phone]; Fax: [phone]

## 2021-09-10 ENCOUNTER — Encounter: Payer: Self-pay | Admitting: Cardiovascular Disease

## 2021-09-10 ENCOUNTER — Other Ambulatory Visit: Payer: Self-pay

## 2021-09-10 ENCOUNTER — Ambulatory Visit (INDEPENDENT_AMBULATORY_CARE_PROVIDER_SITE_OTHER): Payer: Medicare Other | Admitting: Cardiovascular Disease

## 2021-09-10 VITALS — BP 142/82 | HR 50 | Ht 68.0 in | Wt 190.8 lb

## 2021-09-10 DIAGNOSIS — I2510 Atherosclerotic heart disease of native coronary artery without angina pectoris: Secondary | ICD-10-CM | POA: Diagnosis not present

## 2021-09-10 DIAGNOSIS — I7121 Aneurysm of the ascending aorta, without rupture: Secondary | ICD-10-CM

## 2021-09-10 NOTE — Patient Instructions (Signed)
Medication Instructions:  No changes *If you need a refill on your cardiac medications before your next appointment, please call your pharmacy*   Lab Work: none If you have labs (blood work) drawn today and your tests are completely normal, you will receive your results only by: [place] (if you have MyChart) OR A paper copy in the mail If you have any lab test that is abnormal or we need to change your treatment, we will call you to review the results.   Testing/Procedures: We will plan chest MRA for next December prior to your next appointment with Dr. Angelena Form.   Follow-Up: At [facility], you and your health needs are our priority.  As part of our continuing mission to provide you with exceptional heart care, we have created designated Provider Care Teams.  These Care Teams include your primary Cardiologist (physician) and Advanced Practice Providers (APPs -  Physician Assistants and Nurse Practitioners) who all work together to provide you with the care you need, when you need it.   Your next appointment:   12 month(s)  The format for your next appointment:   In Person  Provider:   Lauree Chandler, MD     Other Instructions

## 2021-09-22 ENCOUNTER — Encounter: Payer: Self-pay | Admitting: Internal Medicine

## 2021-09-23 ENCOUNTER — Other Ambulatory Visit: Payer: Self-pay | Admitting: Internal Medicine

## 2021-09-23 DIAGNOSIS — G8929 Other chronic pain: Secondary | ICD-10-CM

## 2021-10-02 ENCOUNTER — Encounter: Payer: Self-pay | Admitting: Cardiovascular Disease

## 2021-10-02 ENCOUNTER — Encounter: Payer: Self-pay | Admitting: Internal Medicine

## 2021-10-02 DIAGNOSIS — I10 Essential (primary) hypertension: Secondary | ICD-10-CM

## 2021-10-02 DIAGNOSIS — I2510 Atherosclerotic heart disease of native coronary artery without angina pectoris: Secondary | ICD-10-CM

## 2021-10-04 MED ORDER — NITROGLYCERIN 0.4 MG SL SUBL
0.4000 mg | SUBLINGUAL_TABLET | SUBLINGUAL | 3 refills | Status: AC | PRN
Start: 2021-10-04 — End: ?

## 2021-10-04 MED ORDER — ATORVASTATIN CALCIUM 80 MG PO TABS: 80.0000 mg | ORAL_TABLET | Freq: Every evening | ORAL | 3 refills | Status: DC

## 2021-10-06 ENCOUNTER — Other Ambulatory Visit: Payer: Self-pay | Admitting: Cardiovascular Disease

## 2021-10-06 ENCOUNTER — Other Ambulatory Visit: Payer: Self-pay

## 2021-10-06 ENCOUNTER — Ambulatory Visit (INDEPENDENT_AMBULATORY_CARE_PROVIDER_SITE_OTHER): Payer: Medicare Other | Admitting: Orthopaedic Surgery

## 2021-10-06 DIAGNOSIS — M545 Low back pain, unspecified: Secondary | ICD-10-CM

## 2021-10-06 NOTE — Progress Notes (Signed)
Chief Complaint: Tailbone pain     History of Present Illness:    Gregory Hale is a 71 y.o. male presents with 8 to 10 weeks of tailbone pain.  He states that he did have a remote history approximately 45 years ago over landing on his buttocks area.  He did not seek medical treatment at that time.  He has been taking Tylenol and ibuprofen for this type of pain.  It predominantly occurs when he is getting in and out of a bunker during golf.  Is not occurring while at rest or while sitting directly on it.  The pain tends to be sharp.  He occasionally uses heat which helps.  Stretching is also helpful.    Surgical History:   None  PMH/PSH/Family History/Social History/Meds/Allergies:    Past Medical History:  Diagnosis Date   Arthritis    Ascending aorta dilatation (HCC)    Bradycardia    a. HR 40 in setting of acute inferior STEMI 03/2017.   CAD in native artery    a. inf STEMI 03/2017 a/w hypotension and bradycardia -> s/p DES to RCA, otherwise mild disease in LAD, moderate disease in ostium of the moderate caliber diagonal branch, normal LVEF >65%   Dilated aortic root (HCC)    Heart attack ([place])    STEMI involving right coronary artery ([place])    04/22/17 PCI/DES to mRCA, normal EF   Tobacco abuse    Quit in 1998   Past Surgical History:  Procedure Laterality Date   CORONARY STENT INTERVENTION N/A 04/22/2017   Procedure: Coronary Stent Intervention;  Surgeon: [name], MD;  Location: [place] [facility];  Service: Cardiovascular;  Laterality: N/A;   LEFT HEART CATH AND CORONARY ANGIOGRAPHY N/A 04/22/2017   Procedure: Left Heart Cath and Coronary Angiography;  Surgeon: [name], MD;  Location: [place] [facility];  Service: Cardiovascular;  Laterality: N/A;   parotid Left 1980   TONSILLECTOMY     as a child   Social History   Socioeconomic History   Marital status: Married    Spouse name: Not on file   Number of  children: 1   Years of education: Not on file   Highest education level: Not on file  Occupational History   Occupation: retired  Tobacco Use   Smoking status: Former    Types: Cigarettes    Quit date: [place]    Years since quitting: 33.0   Smokeless tobacco: Never  Vaping Use   Vaping Use: Never used  Substance and Sexual Activity   Alcohol use: Yes    Alcohol/week: 10.0 standard drinks    Types: 10 Cans of beer per week    Comment: .5 per day   Drug use: No   Sexual activity: Yes    Partners: Female  Other Topics Concern   Not on file  Social History Narrative   Not on file   Social Determinants of Health   Financial Resource Strain: Not on file  Food Insecurity: Not on file  Transportation Needs: Not on file  Physical Activity: Not on file  Stress: Not on file  Social Connections: Not on file   Family History  Problem Relation Age of Onset   CAD Brother    Diabetes Brother    Rheumatic fever Father  Other Father        rheumatic heart   Pancreatic cancer Sister    Colon cancer Neg Hx    Esophageal cancer Neg Hx    Stomach cancer Neg Hx    Rectal cancer Neg Hx    Allergies  Allergen Reactions   Advil [Ibuprofen] Other (See Comments)    bleeding   Current Outpatient Medications  Medication Sig Dispense Refill   acetaminophen (TYLENOL) 500 MG tablet Take 500 mg by mouth every 6 (six) hours as needed.     aspirin EC 81 MG tablet Take 1 tablet (81 mg total) by mouth daily. Swallow whole. 90 tablet 3   atorvastatin (LIPITOR) 80 MG tablet Take 1 tablet (80 mg total) by mouth every evening. 90 tablet 3   celecoxib (CELEBREX) 100 MG capsule Take 1 capsule (100 mg total) by mouth 2 (two) times daily. 180 capsule 1   irbesartan (AVAPRO) 150 MG tablet TAKE ONE TABLET BY MOUTH DAILY 90 tablet 1   metoprolol succinate (TOPROL-XL) 25 MG 24 hr tablet TAKE 1/2 TABLET BY MOUTH DAILY 90 tablet 1   nitroGLYCERIN (NITROSTAT) 0.4 MG SL tablet Place 1 tablet (0.4 mg total)  under the tongue every 5 (five) minutes as needed. 25 tablet 3   No current facility-administered medications for this visit.   No results found.  Review of Systems:   A ROS was performed including pertinent positives and negatives as documented in the HPI.  Physical Exam :   Constitutional: NAD and appears stated age Neurological: Alert and oriented Psych: Appropriate affect and cooperative There were no vitals taken for this visit.   Comprehensive Musculoskeletal Exam:    Tenderness to palpation directly over the coccyx.  He is got no radiating pain down either leg.  Negative straight leg raise.  Sensation is intact throughout.  Imaging:   Xray (lumbar spine 4 views): There is no evidence of an old malunion of his coccyx    I personally reviewed and interpreted the radiographs.   Assessment:   71 year old male with pain consistent with coccydynia.  I discussed with him that predominantly I would recommend a conservative approach to this.  I would like him to undergo physical therapy for core and hip strengthening program.  I do believe that this will help with his golf game as well.  He will see me back as needed.  Plan :    -PT for a home core and hip program -Follow-up as needed     I personally saw and evaluated the patient, and participated in the management and treatment plan.  Vanetta Mulders, MD Attending Physician, Orthopedic Surgery  This document was dictated using Dragon voice recognition software. A reasonable attempt at proof reading has been made to minimize errors.

## 2021-10-07 ENCOUNTER — Other Ambulatory Visit: Payer: Self-pay | Admitting: Internal Medicine

## 2021-10-07 ENCOUNTER — Encounter: Payer: Self-pay | Admitting: Internal Medicine

## 2021-10-07 DIAGNOSIS — M545 Low back pain, unspecified: Secondary | ICD-10-CM

## 2021-10-07 DIAGNOSIS — M159 Polyosteoarthritis, unspecified: Secondary | ICD-10-CM

## 2021-10-07 MED ORDER — CELECOXIB 100 MG PO CAPS: 100.0000 mg | ORAL_CAPSULE | Freq: Two times a day (BID) | ORAL | 1 refills | Status: DC

## 2021-10-11 ENCOUNTER — Other Ambulatory Visit: Payer: Self-pay | Admitting: Internal Medicine

## 2021-10-11 ENCOUNTER — Telehealth: Payer: Self-pay

## 2021-10-11 ENCOUNTER — Encounter: Payer: Self-pay | Admitting: Internal Medicine

## 2021-10-11 DIAGNOSIS — I10 Essential (primary) hypertension: Secondary | ICD-10-CM

## 2021-10-11 DIAGNOSIS — I2510 Atherosclerotic heart disease of native coronary artery without angina pectoris: Secondary | ICD-10-CM

## 2021-10-11 MED ORDER — IRBESARTAN 150 MG PO TABS: 150.0000 mg | ORAL_TABLET | Freq: Every day | ORAL | 1 refills | Status: DC

## 2021-10-11 MED ORDER — METOPROLOL SUCCINATE ER 25 MG PO TB24: 12.5000 mg | ORAL_TABLET | Freq: Every day | ORAL | 1 refills | Status: DC

## 2021-10-11 NOTE — Telephone Encounter (Signed)
Please send short term supply of Metoprolol and irbesartan to [facility] on [address].  Patient has been notified it could take up to three days.  LOV 08/30/21

## 2021-10-12 ENCOUNTER — Other Ambulatory Visit: Payer: Self-pay

## 2021-10-12 ENCOUNTER — Ambulatory Visit (HOSPITAL_BASED_OUTPATIENT_CLINIC_OR_DEPARTMENT_OTHER): Payer: Medicare Other | Attending: Orthopaedic Surgery | Admitting: Physical Therapy

## 2021-10-12 ENCOUNTER — Encounter (HOSPITAL_BASED_OUTPATIENT_CLINIC_OR_DEPARTMENT_OTHER): Payer: Self-pay | Admitting: Physical Therapy

## 2021-10-12 DIAGNOSIS — M6281 Muscle weakness (generalized): Secondary | ICD-10-CM | POA: Diagnosis not present

## 2021-10-12 DIAGNOSIS — M545 Low back pain, unspecified: Secondary | ICD-10-CM | POA: Insufficient documentation

## 2021-10-12 NOTE — Therapy (Addendum)
OUTPATIENT PHYSICAL THERAPY THORACOLUMBAR EVALUATION   Patient Name: Gregory Hale MRN: [id number] DOB:1951/08/20, 71 y.o., male Today's Date: 10/13/2021   PT End of Session - 10/12/21 0830     Visit Number 1    Number of Visits 5    Date for PT Re-Evaluation 11/12/21    PT Start Time 0810    PT Stop Time 0905    PT Time Calculation (min) 55 min    Activity Tolerance Patient tolerated treatment well    Behavior During Therapy [facility] for tasks assessed/performed             Past Medical History:  Diagnosis Date   Arthritis    Ascending aorta dilatation (HCC)    Bradycardia    a. HR 40 in setting of acute inferior STEMI 03/2017.   CAD in native artery    a. inf STEMI 03/2017 a/w hypotension and bradycardia -> s/p DES to RCA, otherwise mild disease in LAD, moderate disease in ostium of the moderate caliber diagonal branch, normal LVEF >65%   Dilated aortic root (HCC)    Heart attack ([place])    STEMI involving right coronary artery ([place])    04/22/17 PCI/DES to mRCA, normal EF   Tobacco abuse    Quit in 1998   Past Surgical History:  Procedure Laterality Date   CORONARY STENT INTERVENTION N/A 04/22/2017   Procedure: Coronary Stent Intervention;  Surgeon: [name], MD;  Location: [place] [facility];  Service: Cardiovascular;  Laterality: N/A;   LEFT HEART CATH AND CORONARY ANGIOGRAPHY N/A 04/22/2017   Procedure: Left Heart Cath and Coronary Angiography;  Surgeon: [name], MD;  Location: [place] [facility];  Service: Cardiovascular;  Laterality: N/A;   parotid Left 1980   TONSILLECTOMY     as a child   Patient Active Problem List   Diagnosis Date Noted   Thrombocytopenia ([place]) 08/30/2021   Chronic hyperglycemia 08/30/2021   Chronic bilateral low back pain without sciatica 08/30/2021   Benign prostatic hyperplasia with weak urinary stream 03/02/2020   Tinea versicolor 03/02/2020   Adenomatous rectal polyp 03/02/2020   Bradycardia, drug induced  03/02/2020   Primary osteoarthritis involving multiple joints 03/02/2020   Routine general medical examination at a health care facility 12/11/2018   Essential hypertension 05/03/2017   Ascending aorta dilatation (HCC)    CAD in native artery    Hypothyroidism 11/02/2009   ERECTILE DYSFUNCTION 11/02/2009   COLONIC POLYPS, HX OF 11/02/2009    PCP: Janith Lima, MD  REFERRING PROVIDER: Vanetta Mulders, MD  REFERRING DIAG: M54.50 (ICD-10-CM) - Bilateral low back pain, unspecified chronicity, unspecified whether sciatica present   THERAPY DIAG:  Low back pain, unspecified back pain laterality, unspecified chronicity, unspecified whether sciatica present  Muscle weakness (generalized)  ONSET DATE: November 2022  SUBJECTIVE:  SUBJECTIVE STATEMENT: Pt states he has had arthritis in his lumbar for a long time.  He  began having tailbone pain approx 2 months ago with no specific MOI.  He states he did have a fall onto his tailbone in 1985 and did not receive medical treatment at that time.   Pt saw PCP who ordered x rays and increased arthritis meds.  Pt reports this did help the arthritis though he continued to have his tailbone pain.  Pt was referred to ortho MD.  Pt was informed that he may have fractured tailbone in 1985 with that fall.  MD note indicated pain consistent with coccydynia.  He ordered PT for core and hip/glute strengthening program.   Pt was having sharp pains initially though has improved.  Pt has pain with bending over and also walking up an incline.  He has sporadic pain with getting out of a bunker during golf and finds the lowest incline to walk out of the bunker.  Pt was taking Tylenol and ibuprofen for pain though is not taking that much now.    Pt is active with [employer]  and plays golf.  He uses a back brace with some walking and when working with [employer]      PERTINENT HISTORY:  Chronic back pain; MI, coronary stent placement on 04/22/2017, Ascending aorta dilatation which is stable and gets checked annually and possibly every 2 years currently   PAIN:  Are you having pain? None sitting at rest NPRS scale: 0/10 currently at rest, 4/10 pain typically, 9.5/10 worst Pain location: on tailbone, lumbar PAIN TYPE: feels like an "arthritis pain, sore" Aggravating factors: walking up incline, bending Relieving factors:   PRECAUTIONS: None  WEIGHT BEARING RESTRICTIONS No  FALLS:  Has patient fallen in last 6 months? No   OCCUPATION: Retired  PLOF: Independent  PATIENT GOALS improve pain   OBJECTIVE:   DIAGNOSTIC FINDINGS: (In Epic) X rays on lumbar: FINDINGS: No acute fracture or subluxation. Multilevel degenerative changes with osteophyte and scoliosis. Multilevel facet arthropathy. Atherosclerotic calcification of the aorta.   IMPRESSION: No acute fracture or subluxation.  PATIENT SURVEYS:  FOTO 65 with a goal of 24    COGNITION:  Overall cognitive status: Within functional limits for tasks assessed       MUSCLE LENGTH: Symmetrical flexibility with slight tightness in bilat HS.    PALPATION: None in bilat lumbar paraspinals, Lumbar SPs, glutes, and sacrum.  Pt has min to moderate soft tissue tightness in bilat lumbar paraspinals.    LUMBARAROM/PROM  A/PROM A/PROM  10/13/2021  Flexion WNL with little pain  Extension WNL  Right lateral flexion WFL  Left lateral flexion WFL  Right rotation 75%  Left rotation 75%   (Blank rows = not tested)  LE AROM/PROM:  A/PROM Right 10/13/2021 Left 10/13/2021  Hip flexion    Hip extension    Hip abduction    Hip adduction    Hip internal rotation    Hip external rotation WFL and symmetrical WFL and symmetrical  Knee flexion    Knee extension    Ankle dorsiflexion     Ankle plantarflexion    Ankle inversion    Ankle eversion     (Blank rows = not tested)  LE MMT:  MMT Right 10/13/2021 Left 10/13/2021  Hip flexion 5/5 5/5  Hip extension 5/5 4/5  Hip abduction    Hip adduction    Hip internal rotation 5/5 5/5  Hip external rotation 5/5  5/5  Knee flexion    Knee extension    Ankle dorsiflexion    Ankle plantarflexion    Ankle inversion    Ankle eversion     (Blank rows = not tested)  LUMBAR SPECIAL TESTS:  Straight leg raise test: Negative bilat    GAIT: Assistive device utilized: None Level of assistance: Complete Independence Comments: Pt has no limp with gait, slightly diminished reciprocal arm swing.    TODAY'S TREATMENT  Reviewed current home program.  Pt performs supine bridge, supine SL K to C, and supine SLR.   Pt performed supine PPT x 10 reps, supine TrA contractions x 10 reps, supine marching with PPT x 10 reps, and supine lumbar rotation bilat x 10 reps.  Pt received a HEP handout and was instructed in correct form and appropriate frequency.     PATIENT EDUCATION:  Education details: POC, dx, prognosis, relevant anatomy.  Answered Pt's questions.  Pt received a HEP handout and was instructed in correct form and appropriate frequency.  Pt instructed to not hold breath with exercises and he shouldn't have pain with exercises.   Person educated: Patient Education method: Explanation, Demonstration, Tactile cues, Verbal cues, and Handouts Education comprehension: verbalized understanding, returned demonstration, verbal cues required, tactile cues required, and needs further education   HOME EXERCISE PROGRAM: Access Code: [email or web address] URL: [email or web address] Date: 10/12/2021 Prepared by: Ronny Flurry  Exercises Supine Posterior Pelvic Tilt - 2 x daily - 7 x weekly - 2 sets - 10 reps Supine March - 2 x daily - 7 x weekly - 2 sets - 10 reps Supine Transversus Abdominis Bracing - Hands on Stomach - 2 x daily -  7 x weekly - 1-2 sets - 10 reps Hooklying Lumbar Rotation - 2 x daily - 7 x weekly - 2 sets - 10 reps   ASSESSMENT:  CLINICAL IMPRESSION: Patient is a 71 y.o.  male with a dx of LBP presenting to the clinic with c/o's of lumbar and coccyx pain, L hip extension weakness, and limited lumbar rotation AROM.  Pt's pain has been improving.  Pt has pain with bending over and also walking up an incline.  He enjoys playing golf and has sporadic pain with getting out of a bunker during golf.  Pt has min to moderate soft tissue tightness in bilat lumbar paraspinals.  Patient should benefit from skilled PT to address above impairments and improve overall function.  Objective impairments include decreased mobility, decreased ROM, decreased strength, hypomobility, increased fascial restrictions, impaired flexibility, and pain. These impairments are limiting patient from  walking up an incline . Personal factors including 1-2 comorbidities:  MI with coronary stent placement on 04/22/2017 and chronic back pain.  are also affecting patient's functional outcome.    REHAB POTENTIAL: Good  CLINICAL DECISION MAKING: Stable/uncomplicated  EVALUATION COMPLEXITY: Low   GOALS:   SHORT TERM GOALS:  STG Name Target Date Goal status  1 Pt will demo improved lumbar AROM to [facility] bilat for improved mobility and stiffness.  Baseline:  10/26/2021 INITIAL  2 Pt will report at least a 25% improvement in pain and sx's overall.  Baseline:  11/02/2021 INITIAL   LONG TERM GOALS:   LTG Name Target Date Goal status  1 Pt will report at least a 70% improvement in pain with walking up an incline for reduced pain with hobbies.   Baseline: 11/09/2021 INITIAL  2 Pt will be independent and compliant with HEP for improved pain, core strength, and ROM for improved  pain with daily mobility, golf, and activities with [employer].  Baseline: 11/09/2021 INITIAL  3 Pt will demo improved core strength by progressing with core  exercises without adverse effects for improved pain with daily mobility and golf activities.   Baseline: 11/09/2021 INITIAL                       PLAN: PT FREQUENCY: 1x/week  PT DURATION: 4 weeks  PLANNED INTERVENTIONS: Therapeutic exercises, Therapeutic activity, Neuro Muscular re-education, Balance training, Gait training, Patient/Family education, Joint mobilization, Stair training, Aquatic Therapy, Dry Needling, Electrical stimulation, Spinal mobilization, Cryotherapy, Moist heat, Taping, Ultrasound, and Manual therapy  PLAN FOR NEXT SESSION: STM/IASTM to lumbar paraspinals.  Review and perform HEP.  Progress core strength.    Selinda Michaels III PT, DPT 10/13/21 10:11 PM

## 2021-10-25 ENCOUNTER — Ambulatory Visit (HOSPITAL_BASED_OUTPATIENT_CLINIC_OR_DEPARTMENT_OTHER): Payer: Medicare Other | Admitting: Physical Therapy

## 2021-10-25 ENCOUNTER — Encounter (HOSPITAL_BASED_OUTPATIENT_CLINIC_OR_DEPARTMENT_OTHER): Payer: Self-pay | Admitting: Physical Therapy

## 2021-10-25 ENCOUNTER — Other Ambulatory Visit: Payer: Self-pay

## 2021-10-25 DIAGNOSIS — M6281 Muscle weakness (generalized): Secondary | ICD-10-CM

## 2021-10-25 DIAGNOSIS — M545 Low back pain, unspecified: Secondary | ICD-10-CM | POA: Diagnosis not present

## 2021-10-25 NOTE — Therapy (Signed)
OUTPATIENT PHYSICAL THERAPY TREATMENT NOTE   Patient Name: Gregory Hale MRN: [id number] DOB:10/29/1950, 71 y.o., male Today's Date: 10/25/2021  PCP: Janith Lima, MD REFERRING PROVIDER: Janith Lima, MD   PT End of Session - 10/25/21 1354     Visit Number 2    Number of Visits 5    Date for PT Re-Evaluation 11/12/21    PT Start Time 3300    PT Stop Time 1345    PT Time Calculation (min) 40 min    Activity Tolerance Patient tolerated treatment well    Behavior During Therapy [facility] for tasks assessed/performed             Past Medical History:  Diagnosis Date   Arthritis    Ascending aorta dilatation ([place])    Bradycardia    a. HR 40 in setting of acute inferior STEMI 03/2017.   CAD in native artery    a. inf STEMI 03/2017 a/w hypotension and bradycardia -> s/p DES to RCA, otherwise mild disease in LAD, moderate disease in ostium of the moderate caliber diagonal branch, normal LVEF >65%   Dilated aortic root (HCC)    Heart attack ([place])    STEMI involving right coronary artery ([place])    04/22/17 PCI/DES to mRCA, normal EF   Tobacco abuse    Quit in 1998   Past Surgical History:  Procedure Laterality Date   CORONARY STENT INTERVENTION N/A 04/22/2017   Procedure: Coronary Stent Intervention;  Surgeon: [name], MD;  Location: [place] [facility];  Service: Cardiovascular;  Laterality: N/A;   LEFT HEART CATH AND CORONARY ANGIOGRAPHY N/A 04/22/2017   Procedure: Left Heart Cath and Coronary Angiography;  Surgeon: [name], MD;  Location: [place] [facility];  Service: Cardiovascular;  Laterality: N/A;   parotid Left 1980   TONSILLECTOMY     as a child   Patient Active Problem List   Diagnosis Date Noted   Thrombocytopenia ([place]) 08/30/2021   Chronic hyperglycemia 08/30/2021   Chronic bilateral low back pain without sciatica 08/30/2021   Benign prostatic hyperplasia with weak urinary stream 03/02/2020   Tinea versicolor 03/02/2020    Adenomatous rectal polyp 03/02/2020   Bradycardia, drug induced 03/02/2020   Primary osteoarthritis involving multiple joints 03/02/2020   Routine general medical examination at a health care facility 12/11/2018   Essential hypertension 05/03/2017   Ascending aorta dilatation (HCC)    CAD in native artery    Hypothyroidism 11/02/2009   ERECTILE DYSFUNCTION 11/02/2009   COLONIC POLYPS, HX OF 11/02/2009    REFERRING PROVIDER: Vanetta Mulders, MD   REFERRING DIAG: M54.50 (ICD-10-CM) - Bilateral low back pain, unspecified chronicity, unspecified whether sciatica present    THERAPY DIAG:  Low back pain, unspecified back pain laterality, unspecified chronicity, unspecified whether sciatica present   Muscle weakness (generalized)   ONSET DATE: November 2022   SUBJECTIVE:  SUBJECTIVE STATEMENT: -MD note indicated pain consistent with coccydynia.  He ordered PT for core and hip/glute strengthening program.   -Pt has pain with bending over and also walking up an incline.  He has sporadic pain with getting out of a bunker during golf and finds the lowest incline to walk out of the bunker.  Pt is active with [employer] and plays golf.  He uses a back brace with some walking and when working with [employer]. -Pt reports no specific improvements though reports gradual improvement.  Pt denies any adverse effects after prior Rx.  Pt reports compliance with HEP.         PERTINENT HISTORY:  Chronic back pain; MI, coronary stent placement on 04/22/2017, Ascending aorta dilatation which is stable and gets checked annually and possibly every 2 years currently    PAIN:  Are you having pain? None sitting at rest NPRS scale: 0/10 currently at rest, 4/10 pain typically, 9.5/10 worst Pain location: on  tailbone, lumbar PAIN TYPE: feels like an "arthritis pain, sore" Aggravating factors: walking up incline, bending Relieving factors:    PRECAUTIONS: None     OCCUPATION: Retired   PLOF: Independent   PATIENT GOALS improve pain     OBJECTIVE:    DIAGNOSTIC FINDINGS: (In Epic) X rays on lumbar: FINDINGS: No acute fracture or subluxation. Multilevel degenerative changes with osteophyte and scoliosis. Multilevel facet arthropathy. Atherosclerotic calcification of the aorta.   IMPRESSION: No acute fracture or subluxation.      TODAY'S TREATMENT  Therapeutic Exercise:   -Reviewed current function, pain level, HEP compliance, and response to prior Rx.  -Pt performed  -supine PPT x 15 reps,  -supine TrA contractions x 10 reps -supine marching with TrA 2 x 10 reps -supine alt LE ext with TrA 2x10 reps -supine alt UE/LE 2x10 reps -supine PPT with GTB 2x10 reps  -supine shoulder flex/ext with 2# with PPT -supine SLR with PPT 2x10 reps -supine lumbar rotation bilat 2 x 10 reps.    -Reviewed and Updated HEP.  Pt received a HEP handout and was instructed in correct form and appropriate frequency.       PATIENT EDUCATION:  Education details:  Reviewed and Updated HEP.  Pt received a HEP handout and was instructed in correct form and appropriate frequency.  Pt instructed to not hold breath with exercises and he shouldn't have pain with exercises.  Instructed pt in correct form including TrA contraction.  Person educated: Patient Education method: Explanation, Demonstration, Tactile cues, Verbal cues, and Handouts Education comprehension: verbalized understanding, returned demonstration, verbal cues required, tactile cues required, and needs further education     HOME EXERCISE PROGRAM: Access Code: [email or web address] URL: [email or web address] Date: 10/25/2021 Prepared by: Ronny Flurry  Exercises Supine Posterior Pelvic Tilt - 2 x daily - 7 x weekly - 2 sets - 10  reps Supine March - 2 x daily - 7 x weekly - 2 sets - 10 reps Supine Transversus Abdominis Bracing - Hands on Stomach - 2 x daily - 7 x weekly - 1-2 sets - 10 reps Hooklying Lumbar Rotation - 2 x daily - 7 x weekly - 2 sets - 10 reps Supine Transversus Abdominis Bracing with Leg Extension - 2 x daily - 6-7 x weekly - 2 sets - 10 reps Hooklying Clamshell with Resistance - 1 x daily - 4 x weekly - 2 sets - 10 reps Supine Active Straight Leg Raise - 1 x daily - 5-6 x weekly -  2 sets - 10 reps Supine shoulder flex/ext with 2# with PPT 1x/day 2 sets of 10 reps     ASSESSMENT:   CLINICAL IMPRESSION: PT reviewed HEP and Pt performed HEP.  Pt required cuing for correct form with TrA contraction.  He improved with TrA contraction with practice and instruction in correct form.  PT progressed core exercises today and pt performed well with cuing and instruction in correct form.  Pt had no c/o's with exercises.  Pt responded well to Rx reporting no pain after Rx.   Patient should benefit from cont skilled PT to address goals and improve overall function.   Objective impairments include decreased mobility, decreased ROM, decreased strength, hypomobility, increased fascial restrictions, impaired flexibility, and pain. These impairments are limiting patient from  walking up an incline . Personal factors including 1-2 comorbidities:  MI with coronary stent placement on 04/22/2017 and chronic back pain.  are also affecting patient's functional outcome.      REHAB POTENTIAL: Good   CLINICAL DECISION MAKING: Stable/uncomplicated   EVALUATION COMPLEXITY: Low     GOALS:     SHORT TERM GOALS:   STG Name Target Date Goal status  1 Pt will demo improved lumbar AROM to [facility] bilat for improved mobility and stiffness.  Baseline:  10/26/2021 INITIAL  2 Pt will report at least a 25% improvement in pain and sx's overall.  Baseline:  11/02/2021 INITIAL    LONG TERM GOALS:    LTG Name Target Date Goal status  1 Pt  will report at least a 70% improvement in pain with walking up an incline for reduced pain with hobbies.   Baseline: 11/09/2021 INITIAL  2 Pt will be independent and compliant with HEP for improved pain, core strength, and ROM for improved pain with daily mobility, golf, and activities with [employer].  Baseline: 11/09/2021 INITIAL  3 Pt will demo improved core strength by progressing with core exercises without adverse effects for improved pain with daily mobility and golf activities.   Baseline: 11/09/2021 INITIAL                                        PLAN: PT FREQUENCY: 1x/week   PT DURATION: 4 weeks   PLANNED INTERVENTIONS: Therapeutic exercises, Therapeutic activity, Neuro Muscular re-education, Balance training, Gait training, Patient/Family education, Joint mobilization, Stair training, Aquatic Therapy, Dry Needling, Electrical stimulation, Spinal mobilization, Cryotherapy, Moist heat, Taping, Ultrasound, and Manual therapy   PLAN FOR NEXT SESSION: STM/IASTM to lumbar paraspinals.  Review and perform HEP.  Progress core strength. Pt will be out of town to visit family and will schedule when he returns.       Selinda Michaels III PT, DPT 10/25/21 5:04 PM

## 2021-11-01 ENCOUNTER — Encounter (HOSPITAL_BASED_OUTPATIENT_CLINIC_OR_DEPARTMENT_OTHER): Payer: Medicare Other | Admitting: Physical Therapy

## 2021-12-03 DIAGNOSIS — N5201 Erectile dysfunction due to arterial insufficiency: Secondary | ICD-10-CM | POA: Diagnosis not present

## 2021-12-04 ENCOUNTER — Telehealth: Payer: Medicare Other | Admitting: Nurse Practitioner

## 2021-12-04 DIAGNOSIS — U071 COVID-19: Secondary | ICD-10-CM | POA: Diagnosis not present

## 2021-12-04 DIAGNOSIS — I2510 Atherosclerotic heart disease of native coronary artery without angina pectoris: Secondary | ICD-10-CM | POA: Diagnosis not present

## 2021-12-04 MED ORDER — MOLNUPIRAVIR EUA 200MG CAPSULE: 4.0000 | ORAL_CAPSULE | Freq: Two times a day (BID) | ORAL | 0 refills | Status: AC

## 2021-12-04 NOTE — Patient Instructions (Signed)
COVID-19: Quarantine and Isolation °Quarantine °If you were exposed °Quarantine and stay away from others when you have been in close contact with someone who has COVID-19. °Isolate °If you are sick or test positive °Isolate when you are sick or when you have COVID-19, even if you don't have symptoms. °When to stay home °Calculating quarantine °The date of your exposure is considered day 0. Day 1 is the first full day after your last contact with a person who has had COVID-19. Stay home and away from other people for at least 5 days. Learn why CDC updated guidance for the general public. °IF YOU were exposed to COVID-19 and are NOT  °up to dateIF YOU were exposed to COVID-19 and are NOT on COVID-19 vaccinations °Quarantine for at least 5 days °Stay home °Stay home and quarantine for at least 5 full days. °Wear a well-fitting mask if you must be around others in your home. °Do not travel. °Get tested °Even if you don't develop symptoms, get tested at least 5 days after you last had close contact with someone with COVID-19. °After quarantine °Watch for symptoms °Watch for symptoms until 10 days after you last had close contact with someone with COVID-19. °Avoid travel °It is best to avoid travel until a full 10 days after you last had close contact with someone with COVID-19. °If you develop symptoms °Isolate immediately and get tested. Continue to stay home until you know the results. Wear a well-fitting mask around others. °Take precautions until day 10 °Wear a well-fitting mask °Wear a well-fitting mask for 10 full days any time you are around others inside your home or in public. Do not go to places where you are unable to wear a well-fitting mask. °If you must travel during days 6-10, take precautions. °Avoid being around people who are more likely to get very sick from COVID-19. °IF YOU were exposed to COVID-19 and are  °up to dateIF YOU were exposed to COVID-19 and are on COVID-19 vaccinations °No  quarantine °You do not need to stay home unless you develop symptoms. °Get tested °Even if you don't develop symptoms, get tested at least 5 days after you last had close contact with someone with COVID-19. °Watch for symptoms °Watch for symptoms until 10 days after you last had close contact with someone with COVID-19. °If you develop symptoms °Isolate immediately and get tested. Continue to stay home until you know the results. Wear a well-fitting mask around others. °Take precautions until day 10 °Wear a well-fitting mask °Wear a well-fitting mask for 10 full days any time you are around others inside your home or in public. Do not go to places where you are unable to wear a well-fitting mask. °Take precautions if traveling °Avoid being around people who are more likely to get very sick from COVID-19. °IF YOU were exposed to COVID-19 and had confirmed COVID-19 within the past 90 days (you tested positive using a viral test) °No quarantine °You do not need to stay home unless you develop symptoms. °Watch for symptoms °Watch for symptoms until 10 days after you last had close contact with someone with COVID-19. °If you develop symptoms °Isolate immediately and get tested. Continue to stay home until you know the results. Wear a well-fitting mask around others. °Take precautions until day 10 °Wear a well-fitting mask °Wear a well-fitting mask for 10 full days any time you are around others inside your home or in public. Do not go to places where you are   unable to wear a well-fitting mask. °Take precautions if traveling °Avoid being around people who are more likely to get very sick from COVID-19. °Calculating isolation °Day 0 is your first day of symptoms or a positive viral test. Day 1 is the first full day after your symptoms developed or your test specimen was collected. If you have COVID-19 or have symptoms, isolate for at least 5 days. °IF YOU tested positive for COVID-19 or have symptoms, regardless of  vaccination status °Stay home for at least 5 days °Stay home for 5 days and isolate from others in your home. °Wear a well-fitting mask if you must be around others in your home. °Do not travel. °Ending isolation if you had symptoms °End isolation after 5 full days if you are fever-free for 24 hours (without the use of fever-reducing medication) and your symptoms are improving. °Ending isolation if you did NOT have symptoms °End isolation after at least 5 full days after your positive test. °If you got very sick from COVID-19 or have a weakened immune system °You should isolate for at least 10 days. Consult your doctor before ending isolation. °Take precautions until day 10 °Wear a well-fitting mask °Wear a well-fitting mask for 10 full days any time you are around others inside your home or in public. Do not go to places where you are unable to wear a well-fitting mask. °Do not travel °Do not travel until a full 10 days after your symptoms started or the date your positive test was taken if you had no symptoms. °Avoid being around people who are more likely to get very sick from COVID-19. °Definitions °Exposure °Contact with someone infected with SARS-CoV-2, the virus that causes COVID-19, in a way that increases the likelihood of getting infected with the virus. °Close contact °A close contact is someone who was less than 6 feet away from an infected person (laboratory-confirmed or a clinical diagnosis) for a cumulative total of 15 minutes or more over a 24-hour period. For example, three individual 5-minute exposures for a total of 15 minutes. People who are exposed to someone with COVID-19 after they completed at least 5 days of isolation are not considered close contacts. °Quarantine °Quarantine is a strategy used to prevent transmission of COVID-19 by keeping people who have been in close contact with someone with COVID-19 apart from others. °Who does not need to quarantine? °If you had close contact with  someone with COVID-19 and you are in one of the following groups, you do not need to quarantine. °You are up to date with your COVID-19 vaccines. °You had confirmed COVID-19 within the last 90 days (meaning you tested positive using a viral test). °If you are up to date with COVID-19 vaccines, you should wear a well-fitting mask around others for 10 days from the date of your last close contact with someone with COVID-19 (the date of last close contact is considered day 0). Get tested at least 5 days after you last had close contact with someone with COVID-19. If you test positive or develop COVID-19 symptoms, isolate from other people and follow recommendations in the Isolation section below. If you tested positive for COVID-19 with a viral test within the previous 90 days and subsequently recovered and remain without COVID-19 symptoms, you do not need to quarantine or get tested after close contact. You should wear a well-fitting mask around others for 10 days from the date of your last close contact with someone with COVID-19 (the date of last   close contact is considered day 0). If you have COVID-19 symptoms, get tested and isolate from other people and follow recommendations in the Isolation section below. °Who should quarantine? °If you come into close contact with someone with COVID-19, you should quarantine if you are not up to date on COVID-19 vaccines. This includes people who are not vaccinated. °What to do for quarantine °Stay home and away from other people for at least 5 days (day 0 through day 5) after your last contact with a person who has COVID-19. The date of your exposure is considered day 0. Wear a well-fitting mask when around others at home, if possible. °For 10 days after your last close contact with someone with COVID-19, watch for fever (100.4°F or greater), cough, shortness of breath, or other COVID-19 symptoms. °If you develop symptoms, get tested immediately and isolate until you receive  your test results. If you test positive, follow isolation recommendations. °If you do not develop symptoms, get tested at least 5 days after you last had close contact with someone with COVID-19. °If you test negative, you can leave your home, but continue to wear a well-fitting mask when around others at home and in public until 10 days after your last close contact with someone with COVID-19. °If you test positive, you should isolate for at least 5 days from the date of your positive test (if you do not have symptoms). If you do develop COVID-19 symptoms, isolate for at least 5 days from the date your symptoms began (the date the symptoms started is day 0). Follow recommendations in the isolation section below. °If you are unable to get a test 5 days after last close contact with someone with COVID-19, you can leave your home after day 5 if you have been without COVID-19 symptoms throughout the 5-day period. Wear a well-fitting mask for 10 days after your date of last close contact when around others at home and in public. °Avoid people who are have weakened immune systems or are more likely to get very sick from COVID-19, and nursing homes and other high-risk settings, until after at least 10 days. °If possible, stay away from people you live with, especially people who are at higher risk for getting very sick from COVID-19, as well as others outside your home throughout the full 10 days after your last close contact with someone with COVID-19. °If you are unable to quarantine, you should wear a well-fitting mask for 10 days when around others at home and in public. °If you are unable to wear a mask when around others, you should continue to quarantine for 10 days. Avoid people who have weakened immune systems or are more likely to get very sick from COVID-19, and nursing homes and other high-risk settings, until after at least 10 days. °See additional information about travel. °Do not go to places where you are  unable to wear a mask, such as restaurants and some gyms, and avoid eating around others at home and at work until after 10 days after your last close contact with someone with COVID-19. °After quarantine °Watch for symptoms until 10 days after your last close contact with someone with COVID-19. °If you have symptoms, isolate immediately and get tested. °Quarantine in high-risk congregate settings °In certain congregate settings that have high risk of secondary transmission (such as correctional and detention facilities, homeless shelters, or cruise ships), CDC recommends a 10-day quarantine for residents, regardless of vaccination and booster status. During periods of critical staffing   shortages, facilities may consider shortening the quarantine period for staff to ensure continuity of operations. Decisions to shorten quarantine in these settings should be made in consultation with state, local, tribal, or territorial health departments and should take into consideration the context and characteristics of the facility. CDC's setting-specific guidance provides additional recommendations for these settings. °Isolation °Isolation is used to separate people with confirmed or suspected COVID-19 from those without COVID-19. People who are in isolation should stay home until it's safe for them to be around others. At home, anyone sick or infected should separate from others, or wear a well-fitting mask when they need to be around others. People in isolation should stay in a specific "sick room" or area and use a separate bathroom if available. Everyone who has presumed or confirmed COVID-19 should stay home and isolate from other people for at least 5 full days (day 0 is the first day of symptoms or the date of the day of the positive viral test for asymptomatic persons). They should wear a mask when around others at home and in public for an additional 5 days. People who are confirmed to have COVID-19 or are showing  symptoms of COVID-19 need to isolate regardless of their vaccination status. This includes: °People who have a positive viral test for COVID-19, regardless of whether or not they have symptoms. °People with symptoms of COVID-19, including people who are awaiting test results or have not been tested. People with symptoms should isolate even if they do not know if they have been in close contact with someone with COVID-19. °What to do for isolation °Monitor your symptoms. If you have an emergency warning sign (including trouble breathing), seek emergency medical care immediately. °Stay in a separate room from other household members, if possible. °Use a separate bathroom, if possible. °Take steps to improve ventilation at home, if possible. °Avoid contact with other members of the household and pets. °Don't share personal household items, like cups, towels, and utensils. °Wear a well-fitting mask when you need to be around other people. °Learn more about what to do if you are sick and how to notify your contacts. °Ending isolation for people who had COVID-19 and had symptoms °If you had COVID-19 and had symptoms, isolate for at least 5 days. To calculate your 5-day isolation period, day 0 is your first day of symptoms. Day 1 is the first full day after your symptoms developed. You can leave isolation after 5 full days. °You can end isolation after 5 full days if you are fever-free for 24 hours without the use of fever-reducing medication and your other symptoms have improved (Loss of taste and smell may persist for weeks or months after recovery and need not delay the end of isolation). °You should continue to wear a well-fitting mask around others at home and in public for 5 additional days (day 6 through day 10) after the end of your 5-day isolation period. If you are unable to wear a mask when around others, you should continue to isolate for a full 10 days. Avoid people who have weakened immune systems or are more  likely to get very sick from COVID-19, and nursing homes and other high-risk settings, until after at least 10 days. °If you continue to have fever or your other symptoms have not improved after 5 days of isolation, you should wait to end your isolation until you are fever-free for 24 hours without the use of fever-reducing medication and your other symptoms have improved.   Continue to wear a well-fitting mask through day 10. Contact your healthcare provider if you have questions. °See additional information about travel. °Do not go to places where you are unable to wear a mask, such as restaurants and some gyms, and avoid eating around others at home and at work until a full 10 days after your first day of symptoms. °If an individual has access to a test and wants to test, the best approach is to use an antigen test1 towards the end of the 5-day isolation period. Collect the test sample only if you are fever-free for 24 hours without the use of fever-reducing medication and your other symptoms have improved (loss of taste and smell may persist for weeks or months after recovery and need not delay the end of isolation). If your test result is positive, you should continue to isolate until day 10. If your test result is negative, you can end isolation, but continue to wear a well-fitting mask around others at home and in public until day 10. Follow additional recommendations for masking and avoiding travel as described above. °1As noted in the labeling for authorized over-the counter antigen tests: Negative results should be treated as presumptive. Negative results do not rule out SARS-CoV-2 infection and should not be used as the sole basis for treatment or patient management decisions, including infection control decisions. To improve results, antigen tests should be used twice over a three-day period with at least 24 hours and no more than 48 hours between tests. °Note that these recommendations on ending isolation  do not apply to people who are moderately ill or very sick from COVID-19 or have weakened immune systems. See section below for recommendations for when to end isolation for these groups. °Ending isolation for people who tested positive for COVID-19 but had no symptoms °If you test positive for COVID-19 and never develop symptoms, isolate for at least 5 days. Day 0 is the day of your positive viral test (based on the date you were tested) and day 1 is the first full day after the specimen was collected for your positive test. You can leave isolation after 5 full days. °If you continue to have no symptoms, you can end isolation after at least 5 days. °You should continue to wear a well-fitting mask around others at home and in public until day 10 (day 6 through day 10). If you are unable to wear a mask when around others, you should continue to isolate for 10 days. Avoid people who have weakened immune systems or are more likely to get very sick from COVID-19, and nursing homes and other high-risk settings, until after at least 10 days. °If you develop symptoms after testing positive, your 5-day isolation period should start over. Day 0 is your first day of symptoms. Follow the recommendations above for ending isolation for people who had COVID-19 and had symptoms. °See additional information about travel. °Do not go to places where you are unable to wear a mask, such as restaurants and some gyms, and avoid eating around others at home and at work until 10 days after the day of your positive test. °If an individual has access to a test and wants to test, the best approach is to use an antigen test1 towards the end of the 5-day isolation period. If your test result is positive, you should continue to isolate until day 10. If your test result is positive, you can also choose to test daily and if your test result   is negative, you can end isolation, but continue to wear a well-fitting mask around others at home and in  public until day 10. Follow additional recommendations for masking and avoiding travel as described above. °1As noted in the labeling for authorized over-the counter antigen tests: Negative results should be treated as presumptive. Negative results do not rule out SARS-CoV-2 infection and should not be used as the sole basis for treatment or patient management decisions, including infection control decisions. To improve results, antigen tests should be used twice over a three-day period with at least 24 hours and no more than 48 hours between tests. °Ending isolation for people who were moderately or very sick from COVID-19 or have a weakened immune system °People who are moderately ill from COVID-19 (experiencing symptoms that affect the lungs like shortness of breath or difficulty breathing) should isolate for 10 days and follow all other isolation precautions. To calculate your 10-day isolation period, day 0 is your first day of symptoms. Day 1 is the first full day after your symptoms developed. If you are unsure if your symptoms are moderate, talk to a healthcare provider for further guidance. °People who are very sick from COVID-19 (this means people who were hospitalized or required intensive care or ventilation support) and people who have weakened immune systems might need to isolate at home longer. They may also require testing with a viral test to determine when they can be around others. CDC recommends an isolation period of at least 10 and up to 20 days for people who were very sick from COVID-19 and for people with weakened immune systems. Consult with your healthcare provider about when you can resume being around other people. If you are unsure if your symptoms are severe or if you have a weakened immune system, talk to a healthcare provider for further guidance. °People who have a weakened immune system should talk to their healthcare provider about the potential for reduced immune responses to  COVID-19 vaccines and the need to continue to follow current prevention measures (including wearing a well-fitting mask and avoiding crowds and poorly ventilated indoor spaces) to protect themselves against COVID-19 until advised otherwise by their healthcare provider. Close contacts of immunocompromised people--including household members--should also be encouraged to receive all recommended COVID-19 vaccine doses to help protect these people. °Isolation in high-risk congregate settings °In certain high-risk congregate settings that have high risk of secondary transmission and where it is not feasible to cohort people (such as correctional and detention facilities, homeless shelters, and cruise ships), CDC recommends a 10-day isolation period for residents. During periods of critical staffing shortages, facilities may consider shortening the isolation period for staff to ensure continuity of operations. Decisions to shorten isolation in these settings should be made in consultation with state, local, tribal, or territorial health departments and should take into consideration the context and characteristics of the facility. CDC's setting-specific guidance provides additional recommendations for these settings. °This CDC guidance is meant to supplement--not replace--any federal, state, local, territorial, or tribal health and safety laws, rules, and regulations. °Recommendations for specific settings °These recommendations do not apply to healthcare professionals. For guidance specific to these settings, see °Healthcare professionals: Interim Guidance for Managing Healthcare Personnel with SARS-CoV-2 Infection or Exposure to SARS-CoV-2 °Patients, residents, and visitors to healthcare settings: Interim Infection Prevention and Control Recommendations for Healthcare Personnel During the Coronavirus Disease 2019 (COVID-19) Pandemic °Additional setting-specific guidance and recommendations are available. °These  recommendations on quarantine and isolation do apply to [employer]12 School   settings. Additional guidance is available here: Overview of COVID-19 Quarantine for K-12 Schools °Travelers: Travel information and recommendations °Congregate facilities and other settings: guidance pages for community, work, and school settings °Ongoing COVID-19 exposure FAQs °I live with someone with COVID-19, but I cannot be separated from them. How do we manage quarantine in this situation? °It is very important for people with COVID-19 to remain apart from other people, if possible, even if they are living together. If separation of the person with COVID-19 from others that they live with is not possible, the other people that they live with will have ongoing exposure, meaning they will be repeatedly exposed until that person is no longer able to spread the virus to other people. In this situation, there are precautions you can take to limit the spread of COVID-19: °The person with COVID-19 and everyone they live with should wear a well-fitting mask inside the home. °If possible, one person should care for the person with COVID-19 to limit the number of people who are in close contact with the infected person. °Take steps to protect yourself and others to reduce transmission in the home: °Quarantine if you are not up to date with your COVID-19 vaccines. °Isolate if you are sick or tested positive for COVID-19, even if you don't have symptoms. °Learn more about the public health recommendations for testing, mask use and quarantine of close contacts, like yourself, who have ongoing exposure. These recommendations differ depending on your vaccination status. °What should I do if I have ongoing exposure to COVID-19 from someone I live with? °Recommendations for this situation depend on your vaccination status: °If you are not up to date on COVID-19 vaccines and have ongoing exposure to COVID-19, you should: °Begin quarantine immediately and  continue to quarantine throughout the isolation period of the person with COVID-19. °Continue to quarantine for an additional 5 days starting the day after the end of isolation for the person with COVID-19. °Get tested at least 5 days after the end of isolation of the infected person that lives with them. °If you test negative, you can leave the home but should continue to wear a well-fitting mask when around others at home and in public until 10 days after the end of isolation for the person with COVID-19. °Isolate immediately if you develop symptoms of COVID-19 or test positive. °If you are up to date with COVID-19 vaccines and have ongoing exposure to COVID-19, you should: °Get tested at least 5 days after your first exposure. A person with COVID-19 is considered infectious starting 2 days before they develop symptoms, or 2 days before the date of their positive test if they do not have symptoms. °Get tested again at least 5 days after the end of isolation for the person with COVID-19. °Wear a well-fitting mask when you are around the person with COVID-19, and do this throughout their isolation period. °Wear a well-fitting mask around others for 10 days after the infected person's isolation period ends. °Isolate immediately if you develop symptoms of COVID-19 or test positive. °What should I do if multiple people I live with test positive for COVID-19 at different times? °Recommendations for this situation depend on your vaccination status: °If you are not up to date with your COVID-19 vaccines, you should: °Quarantine throughout the isolation period of any infected person that you live with. °Continue to quarantine until 5 days after the end of isolation date for the most recently infected person that lives with you. For example, if   the last day of isolation of the person most recently infected with COVID-19 was June 30, the new 5-day quarantine period starts on July 1. °Get tested at least 5 days after the end  of isolation for the most recently infected person that lives with you. °Wear a well-fitting mask when you are around any person with COVID-19 while that person is in isolation. °Wear a well-fitting mask when you are around other people until 10 days after your last close contact. °Isolate immediately if you develop symptoms of COVID-19 or test positive. °If you are up to date with your COVID-19 vaccines, you should: °Get tested at least 5 days after your first exposure. A person with COVID-19 is considered infectious starting 2 days before they developed symptoms, or 2 days before the date of their positive test if they do not have symptoms. °Get tested again at least 5 days after the end of isolation for the most recently infected person that lives with you. °Wear a well-fitting mask when you are around any person with COVID-19 while that person is in isolation. °Wear a well-fitting mask around others for 10 days after the end of isolation for the most recently infected person that lives with you. For example, if the last day of isolation for the person most recently infected with COVID-19 was June 30, the new 10-day period to wear a well-fitting mask indoors in public starts on July 1. °Isolate immediately if you develop symptoms of COVID-19 or test positive. °I had COVID-19 and completed isolation. Do I have to quarantine or get tested if someone I live with gets COVID-19 shortly after I completed isolation? °No. If you recently completed isolation and someone that lives with you tests positive for the virus that causes COVID-19 shortly after the end of your isolation period, you do not have to quarantine or get tested as long as you do not develop new symptoms. Once all of the people that live together have completed isolation or quarantine, refer to the guidance below for new exposures to COVID-19. °If you had COVID-19 in the previous 90 days and then came into close contact with someone with COVID-19, you do  not have to quarantine or get tested if you do not have symptoms. But you should: °Wear a well-fitting mask indoors in public for 10 days after your last close contact. °Monitor for COVID-19 symptoms for 10 days from the date of your last close contact. °Isolate immediately and get tested if symptoms develop. °If more than 90 days have passed since your recovery from infection, follow CDC's recommendations for close contacts. These recommendations will differ depending on your vaccination status. °12/23/2020 °Content source: National Center for Immunization and Respiratory Diseases (NCIRD), Division of Viral Diseases °This information is not intended to replace advice given to you by your health care provider. Make sure you discuss any questions you have with your health care provider. °Document Revised: 04/28/2021 Document Reviewed: 04/28/2021 °Elsevier Patient Education © 2022 [employer] Inc. ° °

## 2021-12-04 NOTE — Progress Notes (Signed)
? ?Virtual Visit Consent  ? ?Gregory Hale, you are scheduled for a virtual visit with Gregory Hale, [place], a [facility] provider, today.   ?  ?Just as with appointments in the office, your consent must be obtained to participate.  Your consent will be active for this visit and any virtual visit you may have with one of our providers in the next 365 days.   ?  ?If you have a MyChart account, a copy of this consent can be sent to you electronically.  All virtual visits are billed to your insurance company just like a traditional visit in the office.   ? ?As this is a virtual visit, video technology does not allow for your provider to perform a traditional examination.  This may limit your provider's ability to fully assess your condition.  If your provider identifies any concerns that need to be evaluated in person or the need to arrange testing (such as labs, EKG, etc.), we will make arrangements to do so.   ?  ?Although advances in technology are sophisticated, we cannot ensure that it will always work on either your end or our end.  If the connection with a video visit is poor, the visit may have to be switched to a telephone visit.  With either a video or telephone visit, we are not always able to ensure that we have a secure connection.    ? ?I need to obtain your verbal consent now.   Are you willing to proceed with your visit today? YES ?  ?Gregory Hale has provided verbal consent on 12/04/2021 for a virtual visit (video or telephone). ?  ?Gregory Hassell Done, FNP  ? ?Date: 12/04/2021 9:34 AM ? ? ?Virtual Visit via Video Note  ? ?I, Gregory Hale, connected with Gregory Hale ([id number], 02-16-1951) on 12/04/21 at  9:45 AM EST by a video-enabled telemedicine application and verified that I am speaking with the correct person using two identifiers. ? ?Location: ?Patient: Virtual Visit Location Patient: Home ?Provider: Virtual Visit Location Provider: Mobile ?  ?I discussed the limitations of  evaluation and management by telemedicine and the availability of in person appointments. The patient expressed understanding and agreed to proceed.   ? ?History of Present Illness: ?Gregory Hale is a 71 y.o. who identifies as a male who was assigned male at birth, and is being seen today for covid positive. ? ?HPI: HPI  ?ROS ? ?Problems:  ?Patient Active Problem List  ? Diagnosis Date Noted  ? Thrombocytopenia ([place]) 08/30/2021  ? Chronic hyperglycemia 08/30/2021  ? Chronic bilateral low back pain without sciatica 08/30/2021  ? Benign prostatic hyperplasia with weak urinary stream 03/02/2020  ? Tinea versicolor 03/02/2020  ? Adenomatous rectal polyp 03/02/2020  ? Bradycardia, drug induced 03/02/2020  ? Primary osteoarthritis involving multiple joints 03/02/2020  ? Routine general medical examination at a health care facility 12/11/2018  ? Essential hypertension 05/03/2017  ? Ascending aorta dilatation ([facility])   ? CAD in native artery   ? Hypothyroidism 11/02/2009  ? ERECTILE DYSFUNCTION 11/02/2009  ? COLONIC POLYPS, HX OF 11/02/2009  ?  ?Allergies:  ?Allergies  ?Allergen Reactions  ? Advil [Ibuprofen] Other (See Comments)  ?  bleeding  ? ?Medications:  ?Current Outpatient Medications:  ?  molnupiravir EUA (LAGEVRIO) 200 mg CAPS capsule, Take 4 capsules (800 mg total) by mouth 2 (two) times daily for 5 days., Disp: 40 capsule, Rfl: 0 ?  acetaminophen (TYLENOL) 500 MG tablet, Take 500 mg by mouth every 6 (six)  hours as needed., Disp: , Rfl:  ?  aspirin EC 81 MG tablet, Take 1 tablet (81 mg total) by mouth daily. Swallow whole., Disp: 90 tablet, Rfl: 3 ?  atorvastatin (LIPITOR) 80 MG tablet, TAKE ONE TABLET BY MOUTH EVERY EVENING, Disp: 90 tablet, Rfl: 3 ?  celecoxib (CELEBREX) 100 MG capsule, Take 1 capsule (100 mg total) by mouth 2 (two) times daily., Disp: 180 capsule, Rfl: 1 ?  irbesartan (AVAPRO) 150 MG tablet, Take 1 tablet (150 mg total) by mouth daily., Disp: 30 tablet, Rfl: 1 ?  metoprolol succinate (TOPROL-XL)  25 MG 24 hr tablet, Take 0.5 tablets (12.5 mg total) by mouth daily., Disp: 15 tablet, Rfl: 1 ?  nitroGLYCERIN (NITROSTAT) 0.4 MG SL tablet, Place 1 tablet (0.4 mg total) under the tongue every 5 (five) minutes as needed., Disp: 25 tablet, Rfl: 3 ? ?Observations/Objective: ?Patient is well-developed, well-nourished in no acute distress.  ?Resting comfortably at home.  ?Head is normocephalic, atraumatic.  ?No labored breathing. Speech is clear and coherent with logical content.  ?Patient is alert and oriented at baseline.  ?Raspy voice ?No cough noted ? ?Assessment and Plan: ? ?Gregory Hale in today with chief complaint of No chief complaint on file. ? ? ?1. Positive self-administered antigen test for COVID-19 ?1. Take meds as prescribed ?2. Use a cool mist humidifier especially during the winter months and when heat has been humid. ?3. Use saline nose sprays frequently ?4. Saline irrigations of the nose can be very helpful if Hale frequently. ? * 4X daily for 1 week* ? * Use of a nettie pot can be helpful with this. Follow directions with this* ?5. Drink plenty of fluids ?6. Keep thermostat turn down low ?7.For any cough or congestion- delsym OTC ?8. For fever or aces or pains- take tylenol or ibuprofen appropriate for age and weight. ? * for fevers greater than 101 orally you may alternate ibuprofen and tylenol every  3 hours. ?  ? ?Meds ordered this encounter  ?Medications  ? molnupiravir EUA (LAGEVRIO) 200 mg CAPS capsule  ?  Sig: Take 4 capsules (800 mg total) by mouth 2 (two) times daily for 5 days.  ?  Dispense:  40 capsule  ?  Refill:  0  ?  Order Specific Question:   Supervising Provider  ?  Answer:   Noemi Chapel [[id number]]  ? ? ? ? ?Follow Up Instructions: ?I discussed the assessment and treatment plan with the patient. The patient was provided an opportunity to ask questions and all were answered. The patient agreed with the plan and demonstrated an understanding of the instructions.  A copy of instructions  were sent to the patient via MyChart. ? ?The patient was advised to call back or seek an in-person evaluation if the symptoms worsen or if the condition fails to improve as anticipated. ? ?Time:  ?I spent 10 minutes with the patient via telehealth technology discussing the above problems/concerns.   ? ?Gregory Hassell Done, FNP ? ?

## 2021-12-14 ENCOUNTER — Telehealth: Payer: Medicare Other

## 2021-12-14 ENCOUNTER — Ambulatory Visit: Payer: Medicare Other | Admitting: Cardiovascular Disease

## 2022-02-15 DIAGNOSIS — H2513 Age-related nuclear cataract, bilateral: Secondary | ICD-10-CM | POA: Diagnosis not present

## 2022-02-15 DIAGNOSIS — H353131 Nonexudative age-related macular degeneration, bilateral, early dry stage: Secondary | ICD-10-CM | POA: Diagnosis not present

## 2022-02-15 DIAGNOSIS — H52223 Regular astigmatism, bilateral: Secondary | ICD-10-CM | POA: Diagnosis not present

## 2022-02-15 DIAGNOSIS — H40023 Open angle with borderline findings, high risk, bilateral: Secondary | ICD-10-CM | POA: Diagnosis not present

## 2022-02-15 DIAGNOSIS — H43811 Vitreous degeneration, right eye: Secondary | ICD-10-CM | POA: Diagnosis not present

## 2022-02-28 ENCOUNTER — Other Ambulatory Visit: Payer: Self-pay | Admitting: Internal Medicine

## 2022-02-28 DIAGNOSIS — I10 Essential (primary) hypertension: Secondary | ICD-10-CM

## 2022-02-28 DIAGNOSIS — I2510 Atherosclerotic heart disease of native coronary artery without angina pectoris: Secondary | ICD-10-CM

## 2022-03-19 ENCOUNTER — Encounter: Payer: Self-pay | Admitting: Internal Medicine

## 2022-03-30 ENCOUNTER — Ambulatory Visit: Payer: Medicare Other | Admitting: Internal Medicine

## 2022-04-13 ENCOUNTER — Ambulatory Visit: Payer: Medicare Other | Admitting: Internal Medicine

## 2022-04-18 ENCOUNTER — Encounter: Payer: Self-pay | Admitting: Internal Medicine

## 2022-04-18 ENCOUNTER — Ambulatory Visit (INDEPENDENT_AMBULATORY_CARE_PROVIDER_SITE_OTHER): Payer: Medicare Other | Admitting: Internal Medicine

## 2022-04-18 VITALS — BP 136/82 | HR 51 | Temp 98.3°F | Ht 68.0 in | Wt 187.0 lb

## 2022-04-18 DIAGNOSIS — E785 Hyperlipidemia, unspecified: Secondary | ICD-10-CM

## 2022-04-18 DIAGNOSIS — M545 Low back pain, unspecified: Secondary | ICD-10-CM

## 2022-04-18 DIAGNOSIS — E039 Hypothyroidism, unspecified: Secondary | ICD-10-CM

## 2022-04-18 DIAGNOSIS — B360 Pityriasis versicolor: Secondary | ICD-10-CM

## 2022-04-18 DIAGNOSIS — M159 Polyosteoarthritis, unspecified: Secondary | ICD-10-CM

## 2022-04-18 DIAGNOSIS — N401 Enlarged prostate with lower urinary tract symptoms: Secondary | ICD-10-CM | POA: Diagnosis not present

## 2022-04-18 DIAGNOSIS — I10 Essential (primary) hypertension: Secondary | ICD-10-CM | POA: Diagnosis not present

## 2022-04-18 DIAGNOSIS — R3912 Poor urinary stream: Secondary | ICD-10-CM | POA: Diagnosis not present

## 2022-04-18 DIAGNOSIS — I2510 Atherosclerotic heart disease of native coronary artery without angina pectoris: Secondary | ICD-10-CM

## 2022-04-18 DIAGNOSIS — D696 Thrombocytopenia, unspecified: Secondary | ICD-10-CM | POA: Diagnosis not present

## 2022-04-18 LAB — PSA: PSA: 0.72 ng/mL (ref 0.10–4.00)

## 2022-04-18 LAB — CBC WITH DIFFERENTIAL/PLATELET
Basophils Absolute: 0 10*3/uL (ref 0.0–0.1)
Basophils Relative: 0.5 % (ref 0.0–3.0)
Eosinophils Absolute: 0.1 10*3/uL (ref 0.0–0.7)
Eosinophils Relative: 1.3 % (ref 0.0–5.0)
HCT: 44.2 % (ref 39.0–52.0)
Hemoglobin: 15 g/dL (ref 13.0–17.0)
Lymphocytes Relative: 24.5 % (ref 12.0–46.0)
Lymphs Abs: 1.7 10*3/uL (ref 0.7–4.0)
MCHC: 33.9 g/dL (ref 30.0–36.0)
MCV: 94.1 fl (ref 78.0–100.0)
Monocytes Absolute: 0.7 10*3/uL (ref 0.1–1.0)
Monocytes Relative: 9.5 % (ref 3.0–12.0)
Neutro Abs: 4.6 10*3/uL (ref 1.4–7.7)
Neutrophils Relative %: 64.2 % (ref 43.0–77.0)
Platelets: 127 10*3/uL — ABNORMAL LOW (ref 150.0–400.0)
RBC: 4.7 Mil/uL (ref 4.22–5.81)
RDW: 14 % (ref 11.5–15.5)
WBC: 7.1 10*3/uL (ref 4.0–10.5)

## 2022-04-18 LAB — URINALYSIS, ROUTINE W REFLEX MICROSCOPIC
Bilirubin Urine: NEGATIVE
Hgb urine dipstick: NEGATIVE
Ketones, ur: NEGATIVE
Leukocytes,Ua: NEGATIVE
Nitrite: NEGATIVE
RBC / HPF: NONE SEEN (ref 0–?)
Specific Gravity, Urine: <=1.005 — AB (ref 1.000–1.030)
Total Protein, Urine: NEGATIVE
Urine Glucose: NEGATIVE
Urobilinogen, UA: 0.2 (ref 0.0–1.0)
WBC, UA: NONE SEEN (ref 0–?)
pH: 6.5 (ref 5.0–8.0)

## 2022-04-18 LAB — HEPATIC FUNCTION PANEL
ALT: 21 U/L (ref 0–53)
AST: 28 U/L (ref 0–37)
Albumin: 4.3 g/dL (ref 3.5–5.2)
Alkaline Phosphatase: 66 U/L (ref 39–117)
Bilirubin, Direct: 0.5 mg/dL — ABNORMAL HIGH (ref 0.0–0.3)
Total Bilirubin: 2.6 mg/dL — ABNORMAL HIGH (ref 0.2–1.2)
Total Protein: 6.3 g/dL (ref 6.0–8.3)

## 2022-04-18 LAB — BASIC METABOLIC PANEL
BUN: 17 mg/dL (ref 6–23)
CO2: 28 mEq/L (ref 19–32)
Calcium: 9 mg/dL (ref 8.4–10.5)
Chloride: 102 mEq/L (ref 96–112)
Creatinine, Ser: 1.02 mg/dL (ref 0.40–1.50)
GFR: 74.74 mL/min (ref >60.00)
Glucose, Bld: 99 mg/dL (ref 70–99)
Potassium: 5.2 mEq/L — ABNORMAL HIGH (ref 3.5–5.1)
Sodium: 138 mEq/L (ref 135–145)

## 2022-04-18 LAB — LIPID PANEL
Cholesterol: 121 mg/dL (ref 0–200)
HDL: 63 mg/dL (ref >39.00)
LDL Cholesterol: 48 mg/dL (ref 0–99)
NonHDL: 57.59
Total CHOL/HDL Ratio: 2
Triglycerides: 48 mg/dL (ref 0.0–149.0)
VLDL: 9.6 mg/dL (ref 0.0–40.0)

## 2022-04-18 LAB — VITAMIN B12: Vitamin B-12: 284 pg/mL (ref 211–911)

## 2022-04-18 LAB — FOLATE: Folate: 14.1 ng/mL (ref >5.9)

## 2022-04-18 LAB — TSH: TSH: 2.79 u[IU]/mL (ref 0.35–5.50)

## 2022-04-18 MED ORDER — METOPROLOL SUCCINATE ER 25 MG PO TB24: 12.5000 mg | ORAL_TABLET | Freq: Every day | ORAL | 1 refills | Status: DC

## 2022-04-18 MED ORDER — FLUCONAZOLE 150 MG PO TABS: 450.0000 mg | ORAL_TABLET | Freq: Once | ORAL | 0 refills | Status: AC

## 2022-04-18 MED ORDER — IRBESARTAN 150 MG PO TABS: 150.0000 mg | ORAL_TABLET | Freq: Every day | ORAL | 1 refills | Status: DC

## 2022-04-18 MED ORDER — FLUCONAZOLE 150 MG PO TABS: 450.0000 mg | ORAL_TABLET | Freq: Once | ORAL | 0 refills | Status: DC

## 2022-04-18 NOTE — Patient Instructions (Signed)
Tinea Versicolor  Tinea versicolor is a common fungal infection. It causes a rash that looks like light or dark patches on the skin. The rash most often occurs on the chest, back, neck, or upper arms. This condition is more common during warm weather. Tinea versicolor usually does not cause any other problems than the rash. In most cases, the infection goes away in a few weeks with treatment. It may take a few months for the patches on your skin to return to your usual skin color. What are the causes? This condition occurs when a certain type of fungus (Malassezia furfur) that is normally present on the skin starts to grow too much. This fungus is a type of yeast. This condition cannot be passed from one person to another (is not contagious). What increases the risk? This condition is more likely to develop when certain factors are present, such as: Heat and humidity. Sweating too much. Hormone changes, such as those that occur when taking birth control pills. Oily skin. A weak disease-fighting system (immunesystem). What are the signs or symptoms? Symptoms of this condition include: A rash of light or dark patches on your skin. The rash may have: Patches of tan or pink spots (on light skin). Patches of white or brown spots (on dark skin). Patches of skin that do not tan. Well-marked edges. Scales on the discolored areas. Mild itching. There may also be no itching. How is this diagnosed? A health care provider can usually diagnose this condition by looking at your skin. During the exam, he or she may use ultraviolet (UV) light to see how much of your skin has been affected. In some cases, a skin sample may be taken by scraping the rash. This sample will be viewed under a microscope to check for yeast overgrowth. How is this treated? Treatment for this condition may include: Dandruff shampoo that is applied to the affected skin during showers or bathing. Over-the-counter medicated skin  cream, lotion, or soaps. Prescription antifungal medicine in the form of skin cream or pills. Medicine to help reduce itching. Follow these instructions at home: Use over-the-counter and prescription medicines only as told by your health care provider. Apply dandruff shampoo to the affected area as told by your health care provider. Do not scratch the affected area of skin. Avoid hot and humid conditions. Do not use tanning booths. Try to avoid sweating a lot. Contact a health care provider if: Your symptoms get worse. You have a fever. You have signs of infection such as: Redness, swelling, or pain at the site of your rash. Warmth coming from your rash. Fluid or blood coming from your rash. Pus or a bad smell coming from your rash. Your rash comes back (recurs) after treatment. Your rash does not improve with treatment and spreads to other parts of the body. Summary Tinea versicolor is a common fungal infection of the skin. It causes a rash that looks like light or dark patches on the skin. The rash most often occurs on the chest, back, neck, or upper arms. A health care provider can usually diagnose this condition by looking at your skin. Treatment may include applying shampoo to the skin and taking or applying medicines. This information is not intended to replace advice given to you by your health care provider. Make sure you discuss any questions you have with your health care provider. Document Revised: 12/01/2020 Document Reviewed: 12/01/2020 Elsevier Patient Education  2023 [employer] Inc.  

## 2022-04-18 NOTE — Progress Notes (Signed)
Subjective:  Patient ID: Gregory Hale, male    DOB: 21-Sep-1951  Age: 71 y.o. MRN: [id number]  CC: Rash, Hypertension, and Hyperlipidemia   HPI Gregory Hale presents for f/up -  He complains of an asymptomatic rash on his back.  He has had this before.  It seems to show up whenever he gets a tan.  He plays golf and is active.  He denies chest pain, shortness of breath, palpitations, dizziness, lightheadedness, near-syncope, or diaphoresis.  Outpatient Medications Prior to Visit  Medication Sig Dispense Refill   acetaminophen (TYLENOL) 500 MG tablet Take 500 mg by mouth every 6 (six) hours as needed.     aspirin EC 81 MG tablet Take 1 tablet (81 mg total) by mouth daily. Swallow whole. 90 tablet 3   atorvastatin (LIPITOR) 80 MG tablet TAKE ONE TABLET BY MOUTH EVERY EVENING 90 tablet 3   celecoxib (CELEBREX) 100 MG capsule Take 1 capsule (100 mg total) by mouth 2 (two) times daily. 180 capsule 1   nitroGLYCERIN (NITROSTAT) 0.4 MG SL tablet Place 1 tablet (0.4 mg total) under the tongue every 5 (five) minutes as needed. 25 tablet 3   irbesartan (AVAPRO) 150 MG tablet Take 1 tablet (150 mg total) by mouth daily. 30 tablet 1   metoprolol succinate (TOPROL-XL) 25 MG 24 hr tablet Take 0.5 tablets (12.5 mg total) by mouth daily. 15 tablet 1   No facility-administered medications prior to visit.    ROS Review of Systems  Constitutional:  Negative for chills, diaphoresis, fatigue and fever.  HENT: Negative.    Eyes: Negative.   Respiratory:  Negative for cough, chest tightness, shortness of breath and wheezing.   Cardiovascular:  Negative for chest pain, palpitations and leg swelling.  Gastrointestinal:  Negative for abdominal pain, diarrhea and nausea.  Genitourinary: Negative.  Negative for difficulty urinating.  Musculoskeletal:  Negative for arthralgias and myalgias.  Skin:  Positive for color change and rash.  Neurological:  Negative for dizziness, weakness and light-headedness.   Hematological:  Negative for adenopathy. Does not bruise/bleed easily.  Psychiatric/Behavioral: Negative.      Objective:  BP 136/82 (BP Location: Left Arm, Patient Position: Sitting, Cuff Size: Large)   Pulse (!) 51   Temp 98.3 F (36.8 C) (Oral)   Ht '5\' 8"'$  (1.727 m)   Wt 187 lb (84.8 kg)   SpO2 98%   BMI 28.43 kg/m   BP Readings from Last 3 Encounters:  04/18/22 136/82  09/10/21 (!) 142/82  08/30/21 136/82    Wt Readings from Last 3 Encounters:  04/18/22 187 lb (84.8 kg)  09/10/21 190 lb 12.8 oz (86.5 kg)  08/30/21 191 lb (86.6 kg)    Physical Exam Vitals reviewed.  Constitutional:      Appearance: Normal appearance.  HENT:     Mouth/Throat:     Mouth: Mucous membranes are moist.  Eyes:     General: No scleral icterus.    Conjunctiva/sclera: Conjunctivae normal.  Cardiovascular:     Rate and Rhythm: Regular rhythm. Bradycardia present.     Pulses: Normal pulses.     Heart sounds: No murmur heard.    No friction rub. No gallop.     Comments: EKG- SB, 50 bpm No LVH, Q waves, or ST/T wave changes unchanged Pulmonary:     Effort: Pulmonary effort is normal.     Breath sounds: No stridor. No wheezing, rhonchi or rales.  Abdominal:     General: Abdomen is flat.  Palpations: There is no mass.     Tenderness: There is no abdominal tenderness. There is no guarding.     Hernia: No hernia is present.  Musculoskeletal:        General: Normal range of motion.     Cervical back: Neck supple.     Right lower leg: No edema.     Left lower leg: No edema.  Lymphadenopathy:     Cervical: No cervical adenopathy.  Skin:    General: Skin is warm and dry.     Findings: Rash present.  Neurological:     General: No focal deficit present.     Mental Status: He is alert. Mental status is at baseline.  Psychiatric:        Mood and Affect: Mood normal.        Behavior: Behavior normal.     Lab Results  Component Value Date   WBC 7.1 04/18/2022   HGB 15.0  04/18/2022   HCT 44.2 04/18/2022   PLT 127.0 (L) 04/18/2022   GLUCOSE 99 04/18/2022   CHOL 121 04/18/2022   TRIG 48.0 04/18/2022   HDL 63.00 04/18/2022   LDLCALC 48 04/18/2022   ALT 21 04/18/2022   AST 28 04/18/2022   NA 138 04/18/2022   K 5.2 (H) 04/18/2022   CL 102 04/18/2022   CREATININE 1.02 04/18/2022   BUN 17 04/18/2022   CO2 28 04/18/2022   TSH 2.79 04/18/2022   PSA 0.72 04/18/2022   HGBA1C 5.7 08/30/2021    MR ANGIO PELVIS W WO CONTRAST  Result Date: 08/27/2021 CLINICAL DATA:  Thoracic aortic aneurysm follow-up EXAM: MRA CHEST ABDOMEN AND PELVIS WITH CONTRAST TECHNIQUE: Multiplanar, multiecho pulse sequences of the chest abdomen and pelvis were obtained with intravenous contrast. Angiographic images of abdomen and pelvis were obtained using MRA technique with intravenous contrast. CONTRAST:  38m GADAVIST GADOBUTROL 1 MMOL/ML IV SOLN COMPARISON:  None. FINDINGS: MRA CHEST Vascular Mild fusiform aneurysmal dilation of the tubular portion of the ascending thoracic aorta with a maximal diameter of 4.1 cm. This is perhaps incrementally larger compared to 4 cm measured previously. The aortic root remains normal in size as does the transverse and descending thoracic aorta. The descending thoracic aorta is tortuous. Conventional 3 vessel arch anatomy. No evidence of dissection. The heart is normal in size. Unremarkable main pulmonary artery. Non Vascular No focal signal abnormality or abnormal enhancement within the lungs, pleura, mediastinum or musculoskeletal structures. No pleural effusion. MRA ABDOMEN Vascular Aorta: No evidence of aneurysm or dissection. Celiac: Widely patent. No evidence of aneurysm, dissection or significant stenosis. SMA: Widely patent. No evidence of aneurysm, dissection or significant stenosis. Renals: Solitary renal arteries without evidence of aneurysm, dissection, significant stenosis or FMD. IMA: Patent without evidence of stenosis, dissection or aneurysm. Veins:  No focal abnormalities. Non Vascular Multiple small circumscribed T2 hyperintense T1 hypointense nonenhancing lesions scattered throughout the liver consistent with small simple cysts. Stable 2.2 cm left adrenal adenoma dating back to November of 2018. Dextroconvex scoliosis of the lumbar spine with multilevel degenerative disc disease. Otherwise, no focal signal abnormality or abnormal enhancement involving the spleen, pancreas, kidneys, stomach, bowel, or lower genitourinary tract. MRA PELVIS Vascular Inflow: Tortuous with but patent. No evidence of aneurysm, dissection, stenosis or occlusion. Proximal Outflow: No evidence of aneurysm, dissection, stenosis or occlusion. Atherosclerotic plaque visualized along the posterior walls of the common femoral arteries bilaterally. Veins: Patent.  No focal abnormality. Non Vascular No signal abnormality or abnormal enhancement. IMPRESSION: 1. Essentially  stable mild aneurysmal dilation of the ascending thoracic aorta with a maximal diameter of 4.1 cm. Four year stability is reassuring. This aortic diameter may be normal for the patient's body surface area. Consider follow-up surveillance imaging every other year. 2. Stable ectasia of the aortic root at 4.4 cm. 3. The distal descending thoracic aorta and upper abdominal aorta are tortuous but nonaneurysmal. 4. No evidence of abdominal aortic, iliac or visceral artery aneurysm. 5. Mild atherosclerotic plaque in the aorta and bilateral common femoral arteries without significant stenosis. 6. Dextroconvex scoliosis of the lumbar spine with associated multilevel degenerative disc disease. 7. Small benign hepatic cysts. 8. Stable left adrenal adenoma. Signed, Criselda Peaches, MD, [facility] Electronically Signed   By: Jacqulynn Cadet M.D.   On: 08/27/2021 13:24   MR ANGIO ABDOMEN W WO CONTRAST  Result Date: 08/27/2021 CLINICAL DATA:  Thoracic aortic  aneurysm follow-up EXAM: MRA CHEST ABDOMEN AND PELVIS WITH CONTRAST TECHNIQUE: Multiplanar, multiecho pulse sequences of the chest abdomen and pelvis were obtained with intravenous contrast. Angiographic images of abdomen and pelvis were obtained using MRA technique with intravenous contrast. CONTRAST:  52m GADAVIST GADOBUTROL 1 MMOL/ML IV SOLN COMPARISON:  None. FINDINGS: MRA CHEST Vascular Mild fusiform aneurysmal dilation of the tubular portion of the ascending thoracic aorta with a maximal diameter of 4.1 cm. This is perhaps incrementally larger compared to 4 cm measured previously. The aortic root remains normal in size as does the transverse and descending thoracic aorta. The descending thoracic aorta is tortuous. Conventional 3 vessel arch anatomy. No evidence of dissection. The heart is normal in size. Unremarkable main pulmonary artery. Non Vascular No focal signal abnormality or abnormal enhancement within the lungs, pleura, mediastinum or musculoskeletal structures. No pleural effusion. MRA ABDOMEN Vascular Aorta: No evidence of aneurysm or dissection. Celiac: Widely patent. No evidence of aneurysm, dissection or significant stenosis. SMA: Widely patent. No evidence of aneurysm, dissection or significant stenosis. Renals: Solitary renal arteries without evidence of aneurysm, dissection, significant stenosis or FMD. IMA: Patent without evidence of stenosis, dissection or aneurysm. Veins: No focal abnormalities. Non Vascular Multiple small circumscribed T2 hyperintense T1 hypointense nonenhancing lesions scattered throughout the liver consistent with small simple cysts. Stable 2.2 cm left adrenal adenoma dating back to November of 2018. Dextroconvex scoliosis of the lumbar spine with multilevel degenerative disc disease. Otherwise, no focal signal abnormality or abnormal enhancement involving the spleen, pancreas, kidneys, stomach, bowel, or lower genitourinary tract. MRA PELVIS Vascular Inflow: Tortuous  with but patent. No evidence of aneurysm, dissection, stenosis or occlusion. Proximal Outflow: No evidence of aneurysm, dissection, stenosis or occlusion. Atherosclerotic plaque visualized along the posterior walls of the common femoral arteries bilaterally. Veins: Patent.  No focal abnormality. Non Vascular No signal abnormality or abnormal enhancement. IMPRESSION: 1. Essentially stable mild aneurysmal dilation of the ascending thoracic aorta with a maximal diameter of 4.1 cm. Four year stability is reassuring. This aortic diameter may be normal for the patient's body surface area. Consider follow-up surveillance imaging every other year. 2. Stable ectasia of the aortic root at 4.4 cm. 3. The distal descending thoracic aorta and upper abdominal aorta are tortuous but nonaneurysmal. 4. No evidence of abdominal aortic, iliac or visceral artery aneurysm. 5. Mild atherosclerotic plaque in the aorta and bilateral common femoral arteries without significant stenosis. 6. Dextroconvex scoliosis of the lumbar spine with associated multilevel degenerative disc disease. 7. Small benign hepatic cysts. 8. Stable left adrenal adenoma. Signed, HCriselda Peaches MD,  [facility] Electronically Signed   By: Jacqulynn Cadet M.D.   On: 08/27/2021 13:24   MR Angiogram Chest W Wo Contrast  Result Date: 08/27/2021 CLINICAL DATA:  Thoracic aortic aneurysm follow-up EXAM: MRA CHEST ABDOMEN AND PELVIS WITH CONTRAST TECHNIQUE: Multiplanar, multiecho pulse sequences of the chest abdomen and pelvis were obtained with intravenous contrast. Angiographic images of abdomen and pelvis were obtained using MRA technique with intravenous contrast. CONTRAST:  42m GADAVIST GADOBUTROL 1 MMOL/ML IV SOLN COMPARISON:  None. FINDINGS: MRA CHEST Vascular Mild fusiform aneurysmal dilation of the tubular portion of the ascending thoracic aorta with a maximal diameter of 4.1 cm. This is  perhaps incrementally larger compared to 4 cm measured previously. The aortic root remains normal in size as does the transverse and descending thoracic aorta. The descending thoracic aorta is tortuous. Conventional 3 vessel arch anatomy. No evidence of dissection. The heart is normal in size. Unremarkable main pulmonary artery. Non Vascular No focal signal abnormality or abnormal enhancement within the lungs, pleura, mediastinum or musculoskeletal structures. No pleural effusion. MRA ABDOMEN Vascular Aorta: No evidence of aneurysm or dissection. Celiac: Widely patent. No evidence of aneurysm, dissection or significant stenosis. SMA: Widely patent. No evidence of aneurysm, dissection or significant stenosis. Renals: Solitary renal arteries without evidence of aneurysm, dissection, significant stenosis or FMD. IMA: Patent without evidence of stenosis, dissection or aneurysm. Veins: No focal abnormalities. Non Vascular Multiple small circumscribed T2 hyperintense T1 hypointense nonenhancing lesions scattered throughout the liver consistent with small simple cysts. Stable 2.2 cm left adrenal adenoma dating back to November of 2018. Dextroconvex scoliosis of the lumbar spine with multilevel degenerative disc disease. Otherwise, no focal signal abnormality or abnormal enhancement involving the spleen, pancreas, kidneys, stomach, bowel, or lower genitourinary tract. MRA PELVIS Vascular Inflow: Tortuous with but patent. No evidence of aneurysm, dissection, stenosis or occlusion. Proximal Outflow: No evidence of aneurysm, dissection, stenosis or occlusion. Atherosclerotic plaque visualized along the posterior walls of the common femoral arteries bilaterally. Veins: Patent.  No focal abnormality. Non Vascular No signal abnormality or abnormal enhancement. IMPRESSION: 1. Essentially stable mild aneurysmal dilation of the ascending thoracic aorta with a maximal diameter of 4.1 cm. Four year stability is reassuring. This aortic  diameter may be normal for the patient's body surface area. Consider follow-up surveillance imaging every other year. 2. Stable ectasia of the aortic root at 4.4 cm. 3. The distal descending thoracic aorta and upper abdominal aorta are tortuous but nonaneurysmal. 4. No evidence of abdominal aortic, iliac or visceral artery aneurysm. 5. Mild atherosclerotic plaque in the aorta and bilateral common femoral arteries without significant stenosis. 6. Dextroconvex scoliosis of the lumbar spine with associated multilevel degenerative disc disease. 7. Small benign hepatic cysts. 8. Stable left adrenal adenoma. Signed, HCriselda Peaches MD, [facility] Electronically Signed   By: HJacqulynn CadetM.D.   On: 08/27/2021 13:24    Assessment & Plan:   JEddiewas seen today for rash, hypertension and hyperlipidemia.  Diagnoses and all orders for this visit:  Essential hypertension-his blood pressure is adequately well controlled. -     Basic metabolic panel; Future -     CBC with Differential/Platelet; Future -     Hepatic function panel; Future -     TSH; Future -     EKG 12-Lead -     metoprolol succinate (TOPROL-XL) 25 MG 24 hr tablet; Take 0.5 tablets (12.5 mg total) by mouth  daily. -     irbesartan (AVAPRO) 150 MG tablet; Take 1 tablet (150 mg total) by mouth daily. -     TSH -     Hepatic function panel -     CBC with Differential/Platelet -     Basic metabolic panel  Thrombocytopenia (HCC)-this is stable.  We will follow. -     CBC with Differential/Platelet; Future -     Vitamin B12; Future -     Folate; Future -     Folate -     Vitamin B12 -     CBC with Differential/Platelet  Benign prostatic hyperplasia with weak urinary stream-his PSA is normal. -     PSA; Future -     Urinalysis, Routine w reflex microscopic; Future -     Urinalysis, Routine w reflex microscopic -     PSA  Hyperlipidemia LDL goal <70- LDL goal  achieved. Doing well on the statin  -     Lipid panel; Future -     Hepatic function panel; Future -     TSH; Future -     TSH -     Hepatic function panel -     Lipid panel  CAD in native artery- He has had no angina and his EKG is reassuring. -     metoprolol succinate (TOPROL-XL) 25 MG 24 hr tablet; Take 0.5 tablets (12.5 mg total) by mouth daily. -     irbesartan (AVAPRO) 150 MG tablet; Take 1 tablet (150 mg total) by mouth daily.  Tinea versicolor -     Discontinue: fluconazole (DIFLUCAN) 150 MG tablet; Take 3 tablets (450 mg total) by mouth once for 1 dose. -     fluconazole (DIFLUCAN) 150 MG tablet; Take 3 tablets (450 mg total) by mouth once for 1 dose.   I have discontinued Ramere Nikolai's fluconazole. I am also having him start on fluconazole. Additionally, I am having him maintain his aspirin EC, acetaminophen, nitroGLYCERIN, atorvastatin, celecoxib, metoprolol succinate, and irbesartan.  Meds ordered this encounter  Medications   DISCONTD: fluconazole (DIFLUCAN) 150 MG tablet    Sig: Take 3 tablets (450 mg total) by mouth once for 1 dose.    Dispense:  3 tablet    Refill:  0   metoprolol succinate (TOPROL-XL) 25 MG 24 hr tablet    Sig: Take 0.5 tablets (12.5 mg total) by mouth daily.    Dispense:  45 tablet    Refill:  1   irbesartan (AVAPRO) 150 MG tablet    Sig: Take 1 tablet (150 mg total) by mouth daily.    Dispense:  90 tablet    Refill:  1   fluconazole (DIFLUCAN) 150 MG tablet    Sig: Take 3 tablets (450 mg total) by mouth once for 1 dose.    Dispense:  3 tablet    Refill:  0     Follow-up: Return in about 6 months (around 10/19/2022).  Scarlette Calico, MD

## 2022-05-02 ENCOUNTER — Other Ambulatory Visit: Payer: Self-pay | Admitting: Internal Medicine

## 2022-05-02 ENCOUNTER — Encounter: Payer: Self-pay | Admitting: Internal Medicine

## 2022-05-02 DIAGNOSIS — M159 Polyosteoarthritis, unspecified: Secondary | ICD-10-CM

## 2022-05-02 DIAGNOSIS — G8929 Other chronic pain: Secondary | ICD-10-CM

## 2022-05-02 MED ORDER — CELECOXIB 100 MG PO CAPS: 100.0000 mg | ORAL_CAPSULE | Freq: Two times a day (BID) | ORAL | 1 refills | Status: DC

## 2022-05-09 ENCOUNTER — Encounter: Payer: Self-pay | Admitting: Internal Medicine

## 2022-05-09 ENCOUNTER — Telehealth: Payer: Medicare Other | Admitting: Emergency Medicine

## 2022-05-09 DIAGNOSIS — U071 COVID-19: Secondary | ICD-10-CM

## 2022-05-09 MED ORDER — NIRMATRELVIR/RITONAVIR (PAXLOVID)TABLET: 3.0000 | ORAL_TABLET | Freq: Two times a day (BID) | ORAL | 0 refills | Status: AC

## 2022-05-09 NOTE — Patient Instructions (Signed)
  Gregory Hale, thank you for joining Carvel Getting, NP for today's virtual visit.  While this provider is not your primary care provider (PCP), if your PCP is located in our provider database this encounter information will be shared with them immediately following your visit.  Consent: (Patient) Gregory Hale provided verbal consent for this virtual visit at the beginning of the encounter.  Current Medications:  Current Outpatient Medications:    nirmatrelvir/ritonavir EUA (PAXLOVID) 20 x 150 MG & 10 x '100MG'$  TABS, Take 3 tablets by mouth 2 (two) times daily for 5 days. (Take nirmatrelvir 150 mg two tablets twice daily for 5 days and ritonavir 100 mg one tablet twice daily for 5 days) Patient GFR is 74 on 04/18/22, Disp: 30 tablet, Rfl: 0   acetaminophen (TYLENOL) 500 MG tablet, Take 500 mg by mouth every 6 (six) hours as needed., Disp: , Rfl:    aspirin EC 81 MG tablet, Take 1 tablet (81 mg total) by mouth daily. Swallow whole., Disp: 90 tablet, Rfl: 3   atorvastatin (LIPITOR) 80 MG tablet, TAKE ONE TABLET BY MOUTH EVERY EVENING, Disp: 90 tablet, Rfl: 3   celecoxib (CELEBREX) 100 MG capsule, Take 1 capsule (100 mg total) by mouth 2 (two) times daily., Disp: 180 capsule, Rfl: 1   irbesartan (AVAPRO) 150 MG tablet, Take 1 tablet (150 mg total) by mouth daily., Disp: 90 tablet, Rfl: 1   metoprolol succinate (TOPROL-XL) 25 MG 24 hr tablet, Take 0.5 tablets (12.5 mg total) by mouth daily., Disp: 45 tablet, Rfl: 1   nitroGLYCERIN (NITROSTAT) 0.4 MG SL tablet, Place 1 tablet (0.4 mg total) under the tongue every 5 (five) minutes as needed., Disp: 25 tablet, Rfl: 3   Medications ordered in this encounter:  Meds ordered this encounter  Medications   nirmatrelvir/ritonavir EUA (PAXLOVID) 20 x 150 MG & 10 x '100MG'$  TABS    Sig: Take 3 tablets by mouth 2 (two) times daily for 5 days. (Take nirmatrelvir 150 mg two tablets twice daily for 5 days and ritonavir 100 mg one tablet twice daily for 5 days) Patient  GFR is 74 on 04/18/22    Dispense:  30 tablet    Refill:  0     *If you need refills on other medications prior to your next appointment, please contact your pharmacy*  Follow-Up: Call back or seek an in-person evaluation if the symptoms worsen or if the condition fails to improve as anticipated.  Other Instructions Start taking Paxlovid tomorrow morning.  Hold your atorvastatin while you are taking the Paxlovid you may restart it the day after you finish the Paxlovid.   If you have been instructed to have an in-person evaluation today at a local Urgent Care facility, please use the link below. It will take you to a list of all of our available [facility], including address, phone number and hours of operation. Please do not delay care.  [facility]  If you or a family member do not have a primary care provider, use the link below to schedule a visit and establish care. When you choose a [facility] primary care physician or advanced practice provider, you gain a long-term partner in health. Find a Primary Care Provider  Learn more about [facility]'s in-office and virtual care options: [facility]

## 2022-05-09 NOTE — Progress Notes (Signed)
Virtual Visit Consent   Gregory Hale, you are scheduled for a virtual visit with a [facility] provider today. Just as with appointments in the office, your consent must be obtained to participate. Your consent will be active for this visit and any virtual visit you may have with one of our providers in the next 365 days. If you have a MyChart account, a copy of this consent can be sent to you electronically.  As this is a virtual visit, video technology does not allow for your provider to perform a traditional examination. This may limit your provider's ability to fully assess your condition. If your provider identifies any concerns that need to be evaluated in person or the need to arrange testing (such as labs, EKG, etc.), we will make arrangements to do so. Although advances in technology are sophisticated, we cannot ensure that it will always work on either your end or our end. If the connection with a video visit is poor, the visit may have to be switched to a telephone visit. With either a video or telephone visit, we are not always able to ensure that we have a secure connection.  By engaging in this virtual visit, you consent to the provision of healthcare and authorize for your insurance to be billed (if applicable) for the services provided during this visit. Depending on your insurance coverage, you may receive a charge related to this service.  I need to obtain your verbal consent now. Are you willing to proceed with your visit today? Tavin Vernet has provided verbal consent on 05/09/2022 for a virtual visit (video or telephone). Carvel Getting, NP  Date: 05/09/2022 6:55 PM  Virtual Visit via Video Note   I, Carvel Getting, connected with  Kristain Hu  ([id number], 02-Feb-1951) on 05/09/22 at  7:00 PM EDT by a video-enabled telemedicine application and verified that I am speaking with the correct person using two identifiers.  Location: Patient: Virtual Visit Location Patient:  Home Provider: Virtual Visit Location Provider: Home Office   I discussed the limitations of evaluation and management by telemedicine and the availability of in person appointments. The patient expressed understanding and agreed to proceed.    History of Present Illness: Gregory Hale is a 71 y.o. who identifies as a male who was assigned male at birth, and is being seen today for COVID.  Patient reports he feels like he has a cold and so he tested himself this evening and is positive for COVID.  He reports he has congestion, mild cough, and a headache.  Denies shortness of breath or wheezing.  Does not know if he has a fever as he has not taken his temperature but does not feel like he has a fever.  He had COVID last year and took molnupiravir.  He does take atorvastatin, last took it about an hour before this appointment.  HPI: HPI  Problems:  Patient Active Problem List   Diagnosis Date Noted   Thrombocytopenia ([facility]) 08/30/2021   Chronic bilateral low back pain without sciatica 08/30/2021   Benign prostatic hyperplasia with weak urinary stream 03/02/2020   Tinea versicolor 03/02/2020   Adenomatous rectal polyp 03/02/2020   Bradycardia, drug induced 03/02/2020   Primary osteoarthritis involving multiple joints 03/02/2020   Essential hypertension 05/03/2017   Ascending aorta dilatation ([facility])    CAD in native artery    Hypothyroidism 11/02/2009   ERECTILE DYSFUNCTION 11/02/2009   COLONIC POLYPS, HX OF 11/02/2009    Allergies:  Allergies  Allergen Reactions   Advil [Ibuprofen] Other (See Comments)    bleeding   Medications:  Current Outpatient Medications:    nirmatrelvir/ritonavir EUA (PAXLOVID) 20 x 150 MG & 10 x '100MG'$  TABS, Take 3 tablets by mouth 2 (two) times daily for 5 days. (Take nirmatrelvir 150 mg two tablets twice daily for 5 days and ritonavir 100 mg one tablet twice daily for 5 days) Patient GFR is 74 on 04/18/22, Disp: 30 tablet, Rfl: 0   acetaminophen (TYLENOL) 500 MG  tablet, Take 500 mg by mouth every 6 (six) hours as needed., Disp: , Rfl:    aspirin EC 81 MG tablet, Take 1 tablet (81 mg total) by mouth daily. Swallow whole., Disp: 90 tablet, Rfl: 3   atorvastatin (LIPITOR) 80 MG tablet, TAKE ONE TABLET BY MOUTH EVERY EVENING, Disp: 90 tablet, Rfl: 3   celecoxib (CELEBREX) 100 MG capsule, Take 1 capsule (100 mg total) by mouth 2 (two) times daily., Disp: 180 capsule, Rfl: 1   irbesartan (AVAPRO) 150 MG tablet, Take 1 tablet (150 mg total) by mouth daily., Disp: 90 tablet, Rfl: 1   metoprolol succinate (TOPROL-XL) 25 MG 24 hr tablet, Take 0.5 tablets (12.5 mg total) by mouth daily., Disp: 45 tablet, Rfl: 1   nitroGLYCERIN (NITROSTAT) 0.4 MG SL tablet, Place 1 tablet (0.4 mg total) under the tongue every 5 (five) minutes as needed., Disp: 25 tablet, Rfl: 3  Observations/Objective: Patient is well-developed, well-nourished in no acute distress.  Resting comfortably  at home.  Head is normocephalic, atraumatic.  No labored breathing.  Speech is clear and coherent with logical content.  Patient is alert and oriented at baseline.    Assessment and Plan: 1. COVID-19  We discussed molnupiravir versus Paxlovid.  Patient agrees to hold his atorvastatin for the 5 days he is taking Paxlovid.  He will start taking Paxlovid tomorrow morning.  Follow Up Instructions: I discussed the assessment and treatment plan with the patient. The patient was provided an opportunity to ask questions and all were answered. The patient agreed with the plan and demonstrated an understanding of the instructions.  A copy of instructions were sent to the patient via MyChart unless otherwise noted below.    The patient was advised to call back or seek an in-person evaluation if the symptoms worsen or if the condition fails to improve as anticipated.  Time:  I spent 8 minutes with the patient via telehealth technology discussing the above problems/concerns.    Carvel Getting, NP

## 2022-05-10 ENCOUNTER — Encounter: Payer: Self-pay | Admitting: Internal Medicine

## 2022-06-01 ENCOUNTER — Ambulatory Visit: Payer: Medicare Other

## 2022-06-29 ENCOUNTER — Telehealth: Payer: Self-pay

## 2022-06-29 NOTE — Telephone Encounter (Signed)
LVM asking if patient would like to schedule AWV.

## 2022-07-04 DIAGNOSIS — Z23 Encounter for immunization: Secondary | ICD-10-CM | POA: Diagnosis not present

## 2022-07-09 ENCOUNTER — Encounter: Payer: Self-pay | Admitting: Cardiovascular Disease

## 2022-07-11 ENCOUNTER — Encounter: Payer: Self-pay | Admitting: Internal Medicine

## 2022-07-12 ENCOUNTER — Other Ambulatory Visit: Payer: Self-pay | Admitting: Internal Medicine

## 2022-07-12 DIAGNOSIS — H9193 Unspecified hearing loss, bilateral: Secondary | ICD-10-CM

## 2022-07-26 ENCOUNTER — Encounter: Payer: Self-pay | Admitting: Internal Medicine

## 2022-07-26 DIAGNOSIS — M159 Polyosteoarthritis, unspecified: Secondary | ICD-10-CM

## 2022-07-26 DIAGNOSIS — G8929 Other chronic pain: Secondary | ICD-10-CM

## 2022-07-26 MED ORDER — CELECOXIB 100 MG PO CAPS: 100.0000 mg | ORAL_CAPSULE | Freq: Two times a day (BID) | ORAL | 0 refills | Status: DC

## 2022-08-07 DIAGNOSIS — Z23 Encounter for immunization: Secondary | ICD-10-CM | POA: Diagnosis not present

## 2022-08-08 DIAGNOSIS — H903 Sensorineural hearing loss, bilateral: Secondary | ICD-10-CM | POA: Diagnosis not present

## 2022-08-29 ENCOUNTER — Other Ambulatory Visit: Payer: Self-pay | Admitting: Cardiovascular Disease

## 2022-09-12 ENCOUNTER — Ambulatory Visit: Payer: Medicare Other | Admitting: Physician Assistant

## 2022-09-19 NOTE — Progress Notes (Unsigned)
Office Visit    Patient Name: Gregory Hale Date of Encounter: 09/19/2022  Primary Care Provider:  Janith Lima, MD Primary Cardiologist:  Gregory Chandler, MD Primary Electrophysiologist: None  Chief Complaint    Gregory Hale is a 71 y.o. male with PMH of CAD s/p acute inferior STEMI treated with DES to occluded RCA, mild disease in the LAD and moderate disease in diagonal branch, ascending aortic dilation (stable at 41 mm), arthritis who presents today for 1 year follow-up.  Past Medical History    Past Medical History:  Diagnosis Date   Arthritis    Ascending aorta dilatation (Gregory Hale)    Bradycardia    a. HR 40 in setting of acute inferior STEMI 03/2017.   CAD in native artery    a. inf STEMI 03/2017 a/w hypotension and bradycardia -> s/p DES to RCA, otherwise mild disease in LAD, moderate disease in ostium of the moderate caliber diagonal branch, normal LVEF >65%   Dilated aortic root (Gregory Hale)    Heart attack (Gregory Hale)    STEMI involving right coronary artery (Gregory Hale)    04/22/17 PCI/DES to mRCA, normal EF   Tobacco abuse    Quit in 1998   Past Surgical History:  Procedure Laterality Date   CORONARY STENT INTERVENTION N/A 04/22/2017   Procedure: Coronary Stent Intervention;  Surgeon: Gregory Blanks, MD;  Location: [facility];  Service: Cardiovascular;  Laterality: N/A;   LEFT HEART CATH AND CORONARY ANGIOGRAPHY N/A 04/22/2017   Procedure: Left Heart Cath and Coronary Angiography;  Surgeon: Gregory Blanks, MD;  Location: [facility];  Service: Cardiovascular;  Laterality: N/A;   parotid Left 1980   TONSILLECTOMY     as a child    Allergies  Allergies  Allergen Reactions   Advil [Ibuprofen] Other (See Comments)    bleeding    History of Present Illness    Gregory Hale  is a 71year old male with the above mention past medical history who presents today for annual follow-up of coronary artery disease.Gregory Hale was initially seen by Dr.  Angelena Hale in 2018 for inferior MI that occurred while playing golf.  While in the ED patient had loss of consciousness and heart rate in the 40s.  BP was documented at 50/20 and patient transferred to the Cath Lab for treatment. LHC showed thrombotic occlusion of RCA treated with DEs, otherwise mild disease in LAD, moderate disease in ostium of the moderate caliber diagonal branch, normal LVEF >65%. 2D Echo showed mild LVH, EF 60-65%, grade 1 DD, mildly dilated aortic root and ascending aorta, mild TR. He was able to be stared on standard post MI therapy including beta blocker and statin.  He was last seen by Dr. Angelena Hale on 08/2021 for follow-up.  During visit patient was doing well with no reports of chest pain, shortness of breath, palpitations.  He is very active and plays golf 3 to 4 days/week.  Most recent chest MRA 08/2021 showing stable ascending aortic aneurysm at 4.1 cm.   Since last being seen in the office patient reports***.  Patient denies chest pain, palpitations, dyspnea, PND, orthopnea, nausea, vomiting, dizziness, syncope, edema, weight gain, or early satiety.     ***Notes:  Home Medications    Current Outpatient Medications  Medication Sig Dispense Refill   acetaminophen (TYLENOL) 500 MG tablet Take 500 mg by mouth every 6 (six) hours as needed.     aspirin EC 81 MG tablet Take 1 tablet (81 mg  total) by mouth daily. Swallow whole. 90 tablet 3   atorvastatin (LIPITOR) 80 MG tablet TAKE ONE TABLET BY MOUTH EVERY EVENING 90 tablet 3   celecoxib (CELEBREX) 100 MG capsule Take 1 capsule (100 mg total) by mouth 2 (two) times daily. 180 capsule 0   irbesartan (AVAPRO) 150 MG tablet Take 1 tablet (150 mg total) by mouth daily. 90 tablet 1   metoprolol succinate (TOPROL-XL) 25 MG 24 hr tablet Take 0.5 tablets (12.5 mg total) by mouth daily. 45 tablet 1   nitroGLYCERIN (NITROSTAT) 0.4 MG SL tablet Place 1 tablet (0.4 mg total) under the tongue every 5 (five) minutes as needed. 25 tablet 3    No current facility-administered medications for this visit.     Review of Systems  Please see the history of present illness.    (+)*** (+)***  All other systems reviewed and are otherwise negative except as noted above.  Physical Exam    Wt Readings from Last 3 Encounters:  04/18/22 187 lb (84.8 kg)  09/10/21 190 lb 12.8 oz (86.5 kg)  08/30/21 191 lb (86.6 kg)   WI:OXBDZ were no vitals filed for this visit.,There is no height or weight on file to calculate BMI.  Constitutional:      Appearance: Healthy appearance. Not in distress.  Neck:     Vascular: JVD normal.  Pulmonary:     Effort: Pulmonary effort is normal.     Breath sounds: No wheezing. No rales. Diminished in the bases Cardiovascular:     Normal rate. Regular rhythm. Normal S1. Normal S2.      Murmurs: There is no murmur.  Edema:    Peripheral edema absent.  Abdominal:     Palpations: Abdomen is soft non tender. There is no hepatomegaly.  Skin:    General: Skin is warm and dry.  Neurological:     General: No focal deficit present.     Mental Status: Alert and oriented to person, place and time.     Cranial Nerves: Cranial nerves are intact.  EKG/LABS/Other Studies Reviewed    ECG personally reviewed by me today - ***  Risk Assessment/Calculations:   {Does this patient have ATRIAL FIBRILLATION?:[phone]}        Lab Results  Component Value Date   WBC 7.1 04/18/2022   HGB 15.0 04/18/2022   HCT 44.2 04/18/2022   MCV 94.1 04/18/2022   PLT 127.0 (L) 04/18/2022   Lab Results  Component Value Date   CREATININE 1.02 04/18/2022   BUN 17 04/18/2022   NA 138 04/18/2022   K 5.2 (H) 04/18/2022   CL 102 04/18/2022   CO2 28 04/18/2022   Lab Results  Component Value Date   ALT 21 04/18/2022   AST 28 04/18/2022   ALKPHOS 66 04/18/2022   BILITOT 2.6 (H) 04/18/2022   Lab Results  Component Value Date   CHOL 121 04/18/2022   HDL 63.00 04/18/2022   LDLCALC 48 04/18/2022   TRIG 48.0 04/18/2022    CHOLHDL 2 04/18/2022    Lab Results  Component Value Date   HGBA1C 5.7 08/30/2021    Assessment & Plan    1.  Coronary artery disease: -s/p inferior STEMI treated with DES x 1 to occluded RCA.  Today patient reports*** -Continue GDMT with ASA 81 mg, Lipitor 80 mg, metoprolol succinate 12.5 mg daily  2.  Essential hypertension: -Patient's blood pressure today was*** -Continue irbesartan 150 mg and Toprol-XL 12.5 mg daily  3.  Hyperlipidemia: -Patient's last LDL  cholesterol was*** -Continue Lipitor 80 mg daily  4.  Thoracic aortic aneurysm: -Patient's last chest MRA 08/2021 with stable ascending aneurysm at 41 mm.      Disposition: Follow-up with Gregory Chandler, MD or APP in *** months {Are you ordering a CV Procedure (e.g. stress test, cath, DCCV, TEE, etc)?   Press F2        :[id number]}   Medication Adjustments/Labs and Tests Ordered: Current medicines are reviewed at length with the patient today.  Concerns regarding medicines are outlined above.   Signed, Mable Fill, Marissa Nestle, NP 09/19/2022, 1:56 PM [facility]  Note:  This document was prepared using Dragon voice recognition software and may include unintentional dictation errors.

## 2022-09-20 ENCOUNTER — Ambulatory Visit: Payer: Medicare Other | Attending: Physician Assistant | Admitting: Nurse Practitioner

## 2022-09-20 ENCOUNTER — Encounter: Payer: Self-pay | Admitting: Nurse Practitioner

## 2022-09-20 VITALS — BP 128/78 | HR 67 | Ht 68.0 in | Wt 194.8 lb

## 2022-09-20 DIAGNOSIS — E785 Hyperlipidemia, unspecified: Secondary | ICD-10-CM | POA: Insufficient documentation

## 2022-09-20 DIAGNOSIS — I10 Essential (primary) hypertension: Secondary | ICD-10-CM | POA: Diagnosis not present

## 2022-09-20 DIAGNOSIS — I2510 Atherosclerotic heart disease of native coronary artery without angina pectoris: Secondary | ICD-10-CM | POA: Diagnosis not present

## 2022-09-20 DIAGNOSIS — I77810 Thoracic aortic ectasia: Secondary | ICD-10-CM | POA: Diagnosis not present

## 2022-09-20 NOTE — Patient Instructions (Signed)
Medication Instructions:  Your physician recommends that you continue on your current medications as directed. Please refer to the Current Medication list given to you today.  *If you need a refill on your cardiac medications before your next appointment, please call your pharmacy*   Follow-Up: At [facility], you and your health needs are our priority.  As part of our continuing mission to provide you with exceptional heart care, we have created designated Provider Care Teams.  These Care Teams include your primary Cardiologist (physician) and Advanced Practice Providers (APPs -  Physician Assistants and Nurse Practitioners) who all work together to provide you with the care you need, when you need it.  Your next appointment:   1 year(s)  The format for your next appointment:   In Person  Provider:   Lauree Chandler, MD     Important Information About Sugar

## 2022-09-24 ENCOUNTER — Encounter: Payer: Self-pay | Admitting: Internal Medicine

## 2022-09-27 ENCOUNTER — Other Ambulatory Visit: Payer: Self-pay | Admitting: Internal Medicine

## 2022-09-27 DIAGNOSIS — I2510 Atherosclerotic heart disease of native coronary artery without angina pectoris: Secondary | ICD-10-CM

## 2022-09-27 DIAGNOSIS — I10 Essential (primary) hypertension: Secondary | ICD-10-CM

## 2022-10-03 ENCOUNTER — Ambulatory Visit: Payer: Medicare Other | Admitting: Cardiovascular Disease

## 2022-10-10 ENCOUNTER — Encounter: Payer: Self-pay | Admitting: Internal Medicine

## 2022-10-10 ENCOUNTER — Ambulatory Visit (INDEPENDENT_AMBULATORY_CARE_PROVIDER_SITE_OTHER): Payer: Medicare Other | Admitting: Internal Medicine

## 2022-10-10 VITALS — BP 144/86 | HR 82 | Temp 97.6°F | Resp 16 | Ht 68.0 in | Wt 196.0 lb

## 2022-10-10 DIAGNOSIS — E039 Hypothyroidism, unspecified: Secondary | ICD-10-CM

## 2022-10-10 DIAGNOSIS — I2510 Atherosclerotic heart disease of native coronary artery without angina pectoris: Secondary | ICD-10-CM | POA: Diagnosis not present

## 2022-10-10 DIAGNOSIS — M159 Polyosteoarthritis, unspecified: Secondary | ICD-10-CM

## 2022-10-10 DIAGNOSIS — D696 Thrombocytopenia, unspecified: Secondary | ICD-10-CM

## 2022-10-10 DIAGNOSIS — I10 Essential (primary) hypertension: Secondary | ICD-10-CM

## 2022-10-10 DIAGNOSIS — G8929 Other chronic pain: Secondary | ICD-10-CM

## 2022-10-10 LAB — URINALYSIS, ROUTINE W REFLEX MICROSCOPIC
Bilirubin Urine: NEGATIVE
Hgb urine dipstick: NEGATIVE
Ketones, ur: NEGATIVE
Leukocytes,Ua: NEGATIVE
Nitrite: NEGATIVE
RBC / HPF: NONE SEEN (ref 0–?)
Specific Gravity, Urine: 1.01 (ref 1.000–1.030)
Total Protein, Urine: NEGATIVE
Urine Glucose: NEGATIVE
Urobilinogen, UA: 0.2 (ref 0.0–1.0)
pH: 7.5 (ref 5.0–8.0)

## 2022-10-10 LAB — CBC WITH DIFFERENTIAL/PLATELET
Basophils Absolute: 0 10*3/uL (ref 0.0–0.1)
Basophils Relative: 0.7 % (ref 0.0–3.0)
Eosinophils Absolute: 0.1 10*3/uL (ref 0.0–0.7)
Eosinophils Relative: 2.4 % (ref 0.0–5.0)
HCT: 44.9 % (ref 39.0–52.0)
Hemoglobin: 15.2 g/dL (ref 13.0–17.0)
Lymphocytes Relative: 28.1 % (ref 12.0–46.0)
Lymphs Abs: 1.7 10*3/uL (ref 0.7–4.0)
MCHC: 33.8 g/dL (ref 30.0–36.0)
MCV: 93.4 fl (ref 78.0–100.0)
Monocytes Absolute: 0.7 10*3/uL (ref 0.1–1.0)
Monocytes Relative: 11.1 % (ref 3.0–12.0)
Neutro Abs: 3.6 10*3/uL (ref 1.4–7.7)
Neutrophils Relative %: 57.7 % (ref 43.0–77.0)
Platelets: 163 10*3/uL (ref 150.0–400.0)
RBC: 4.81 Mil/uL (ref 4.22–5.81)
RDW: 13.9 % (ref 11.5–15.5)
WBC: 6.2 10*3/uL (ref 4.0–10.5)

## 2022-10-10 LAB — BASIC METABOLIC PANEL
BUN: 14 mg/dL (ref 6–23)
CO2: 27 mEq/L (ref 19–32)
Calcium: 9.4 mg/dL (ref 8.4–10.5)
Chloride: 99 mEq/L (ref 96–112)
Creatinine, Ser: 0.98 mg/dL (ref 0.40–1.50)
GFR: 78.16 mL/min (ref >60.00)
Glucose, Bld: 116 mg/dL — ABNORMAL HIGH (ref 70–99)
Potassium: 5.1 mEq/L (ref 3.5–5.1)
Sodium: 135 mEq/L (ref 135–145)

## 2022-10-10 LAB — TSH: TSH: 2.87 u[IU]/mL (ref 0.35–5.50)

## 2022-10-10 MED ORDER — IRBESARTAN 300 MG PO TABS: 300.0000 mg | ORAL_TABLET | Freq: Every day | ORAL | 1 refills | Status: DC

## 2022-10-10 MED ORDER — AMLODIPINE BESYLATE 5 MG PO TABS: 5.0000 mg | ORAL_TABLET | Freq: Every day | ORAL | 0 refills | Status: DC

## 2022-10-10 NOTE — Progress Notes (Signed)
Subjective:  Patient ID: Gregory Hale, male    DOB: Feb 17, 1951  Age: 72 y.o. MRN: [id number]  CC: Hypertension, Hypothyroidism, Osteoarthritis, and Coronary Artery Disease   HPI Gregory Hale presents for f/up -   He is active and denies DOE, CP, SOB, edema.  Outpatient Medications Prior to Visit  Medication Sig Dispense Refill   acetaminophen (TYLENOL) 500 MG tablet Take 500 mg by mouth every 6 (six) hours as needed.     aspirin EC 81 MG tablet Take 1 tablet (81 mg total) by mouth daily. Swallow whole. 90 tablet 3   nitroGLYCERIN (NITROSTAT) 0.4 MG SL tablet Place 1 tablet (0.4 mg total) under the tongue every 5 (five) minutes as needed. 25 tablet 3   atorvastatin (LIPITOR) 80 MG tablet TAKE ONE TABLET BY MOUTH EVERY EVENING 90 tablet 3   celecoxib (CELEBREX) 100 MG capsule Take 1 capsule (100 mg total) by mouth 2 (two) times daily. 180 capsule 0   irbesartan (AVAPRO) 150 MG tablet Take 1 tablet (150 mg total) by mouth daily. 90 tablet 1   metoprolol succinate (TOPROL-XL) 25 MG 24 hr tablet Take 0.5 tablets (12.5 mg total) by mouth daily. 45 tablet 1   No facility-administered medications prior to visit.    ROS Review of Systems  Constitutional: Negative.  Negative for diaphoresis and fatigue.  HENT: Negative.    Eyes: Negative.   Respiratory:  Negative for cough, chest tightness, shortness of breath and wheezing.   Cardiovascular:  Negative for chest pain, palpitations and leg swelling.  Gastrointestinal:  Negative for abdominal pain, diarrhea, nausea and vomiting.  Endocrine: Negative.   Genitourinary: Negative.  Negative for difficulty urinating.  Musculoskeletal:  Positive for arthralgias. Negative for myalgias.  Neurological:  Negative for dizziness, weakness and light-headedness.  Hematological:  Negative for adenopathy. Does not bruise/bleed easily.  Psychiatric/Behavioral: Negative.      Objective:  BP (!) 144/86 (BP Location: Right Arm, Patient Position: Sitting,  Cuff Size: Large)   Pulse 82   Temp 97.6 F (36.4 C) (Oral)   Resp 16   Ht '5\' 8"'$  (1.727 m)   Wt 196 lb (88.9 kg)   SpO2 98%   BMI 29.80 kg/m   BP Readings from Last 3 Encounters:  10/10/22 (!) 144/86  09/20/22 128/78  04/18/22 136/82    Wt Readings from Last 3 Encounters:  10/10/22 196 lb (88.9 kg)  09/20/22 194 lb 12.8 oz (88.4 kg)  04/18/22 187 lb (84.8 kg)    Physical Exam Vitals reviewed.  HENT:     Mouth/Throat:     Mouth: Mucous membranes are moist.  Eyes:     General: No scleral icterus.    Conjunctiva/sclera: Conjunctivae normal.  Cardiovascular:     Rate and Rhythm: Normal rate and regular rhythm.     Heart sounds: No murmur heard. Pulmonary:     Effort: Pulmonary effort is normal.     Breath sounds: No stridor. No wheezing, rhonchi or rales.  Abdominal:     General: Abdomen is flat.     Palpations: There is no mass.     Tenderness: There is no abdominal tenderness. There is no guarding.     Hernia: No hernia is present.  Musculoskeletal:        General: Normal range of motion.     Cervical back: Neck supple.     Right lower leg: No edema.     Left lower leg: No edema.  Lymphadenopathy:     Cervical: No  cervical adenopathy.  Skin:    General: Skin is warm and dry.  Neurological:     General: No focal deficit present.     Mental Status: He is alert.  Psychiatric:        Mood and Affect: Mood normal.        Behavior: Behavior normal.     Lab Results  Component Value Date   WBC 6.2 10/10/2022   HGB 15.2 10/10/2022   HCT 44.9 10/10/2022   PLT 163.0 10/10/2022   GLUCOSE 116 (H) 10/10/2022   CHOL 121 04/18/2022   TRIG 48.0 04/18/2022   HDL 63.00 04/18/2022   LDLCALC 48 04/18/2022   ALT 21 04/18/2022   AST 28 04/18/2022   NA 135 10/10/2022   K 5.1 10/10/2022   CL 99 10/10/2022   CREATININE 0.98 10/10/2022   BUN 14 10/10/2022   CO2 27 10/10/2022   TSH 2.87 10/10/2022   PSA 0.72 04/18/2022   HGBA1C 5.7 08/30/2021    MR ANGIO PELVIS W  WO CONTRAST  Result Date: 08/27/2021 CLINICAL DATA:  Thoracic aortic aneurysm follow-up EXAM: MRA CHEST ABDOMEN AND PELVIS WITH CONTRAST TECHNIQUE: Multiplanar, multiecho pulse sequences of the chest abdomen and pelvis were obtained with intravenous contrast. Angiographic images of abdomen and pelvis were obtained using MRA technique with intravenous contrast. CONTRAST:  10m GADAVIST GADOBUTROL 1 MMOL/ML IV SOLN COMPARISON:  None. FINDINGS: MRA CHEST Vascular Mild fusiform aneurysmal dilation of the tubular portion of the ascending thoracic aorta with a maximal diameter of 4.1 cm. This is perhaps incrementally larger compared to 4 cm measured previously. The aortic root remains normal in size as does the transverse and descending thoracic aorta. The descending thoracic aorta is tortuous. Conventional 3 vessel arch anatomy. No evidence of dissection. The heart is normal in size. Unremarkable main pulmonary artery. Non Vascular No focal signal abnormality or abnormal enhancement within the lungs, pleura, mediastinum or musculoskeletal structures. No pleural effusion. MRA ABDOMEN Vascular Aorta: No evidence of aneurysm or dissection. Celiac: Widely patent. No evidence of aneurysm, dissection or significant stenosis. SMA: Widely patent. No evidence of aneurysm, dissection or significant stenosis. Renals: Solitary renal arteries without evidence of aneurysm, dissection, significant stenosis or FMD. IMA: Patent without evidence of stenosis, dissection or aneurysm. Veins: No focal abnormalities. Non Vascular Multiple small circumscribed T2 hyperintense T1 hypointense nonenhancing lesions scattered throughout the liver consistent with small simple cysts. Stable 2.2 cm left adrenal adenoma dating back to November of 2018. Dextroconvex scoliosis of the lumbar spine with multilevel degenerative disc disease. Otherwise, no focal signal abnormality or abnormal enhancement involving the spleen, pancreas, kidneys, stomach,  bowel, or lower genitourinary tract. MRA PELVIS Vascular Inflow: Tortuous with but patent. No evidence of aneurysm, dissection, stenosis or occlusion. Proximal Outflow: No evidence of aneurysm, dissection, stenosis or occlusion. Atherosclerotic plaque visualized along the posterior walls of the common femoral arteries bilaterally. Veins: Patent.  No focal abnormality. Non Vascular No signal abnormality or abnormal enhancement. IMPRESSION: 1. Essentially stable mild aneurysmal dilation of the ascending thoracic aorta with a maximal diameter of 4.1 cm. Four year stability is reassuring. This aortic diameter may be normal for the patient's body surface area. Consider follow-up surveillance imaging every other year. 2. Stable ectasia of the aortic root at 4.4 cm. 3. The distal descending thoracic aorta and upper abdominal aorta are tortuous but nonaneurysmal. 4. No evidence of abdominal aortic, iliac or visceral artery aneurysm. 5. Mild atherosclerotic plaque in the aorta and bilateral common femoral arteries without significant  stenosis. 6. Dextroconvex scoliosis of the lumbar spine with associated multilevel degenerative disc disease. 7. Small benign hepatic cysts. 8. Stable left adrenal adenoma. Signed, Criselda Peaches, MD, [facility] Electronically Signed   By: Jacqulynn Cadet M.D.   On: 08/27/2021 13:24   MR ANGIO ABDOMEN W WO CONTRAST  Result Date: 08/27/2021 CLINICAL DATA:  Thoracic aortic aneurysm follow-up EXAM: MRA CHEST ABDOMEN AND PELVIS WITH CONTRAST TECHNIQUE: Multiplanar, multiecho pulse sequences of the chest abdomen and pelvis were obtained with intravenous contrast. Angiographic images of abdomen and pelvis were obtained using MRA technique with intravenous contrast. CONTRAST:  3m GADAVIST GADOBUTROL 1 MMOL/ML IV SOLN COMPARISON:  None. FINDINGS: MRA CHEST Vascular Mild fusiform aneurysmal dilation of the tubular portion of the  ascending thoracic aorta with a maximal diameter of 4.1 cm. This is perhaps incrementally larger compared to 4 cm measured previously. The aortic root remains normal in size as does the transverse and descending thoracic aorta. The descending thoracic aorta is tortuous. Conventional 3 vessel arch anatomy. No evidence of dissection. The heart is normal in size. Unremarkable main pulmonary artery. Non Vascular No focal signal abnormality or abnormal enhancement within the lungs, pleura, mediastinum or musculoskeletal structures. No pleural effusion. MRA ABDOMEN Vascular Aorta: No evidence of aneurysm or dissection. Celiac: Widely patent. No evidence of aneurysm, dissection or significant stenosis. SMA: Widely patent. No evidence of aneurysm, dissection or significant stenosis. Renals: Solitary renal arteries without evidence of aneurysm, dissection, significant stenosis or FMD. IMA: Patent without evidence of stenosis, dissection or aneurysm. Veins: No focal abnormalities. Non Vascular Multiple small circumscribed T2 hyperintense T1 hypointense nonenhancing lesions scattered throughout the liver consistent with small simple cysts. Stable 2.2 cm left adrenal adenoma dating back to November of 2018. Dextroconvex scoliosis of the lumbar spine with multilevel degenerative disc disease. Otherwise, no focal signal abnormality or abnormal enhancement involving the spleen, pancreas, kidneys, stomach, bowel, or lower genitourinary tract. MRA PELVIS Vascular Inflow: Tortuous with but patent. No evidence of aneurysm, dissection, stenosis or occlusion. Proximal Outflow: No evidence of aneurysm, dissection, stenosis or occlusion. Atherosclerotic plaque visualized along the posterior walls of the common femoral arteries bilaterally. Veins: Patent.  No focal abnormality. Non Vascular No signal abnormality or abnormal enhancement. IMPRESSION: 1. Essentially stable mild aneurysmal dilation of the ascending thoracic aorta with a  maximal diameter of 4.1 cm. Four year stability is reassuring. This aortic diameter may be normal for the patient's body surface area. Consider follow-up surveillance imaging every other year. 2. Stable ectasia of the aortic root at 4.4 cm. 3. The distal descending thoracic aorta and upper abdominal aorta are tortuous but nonaneurysmal. 4. No evidence of abdominal aortic, iliac or visceral artery aneurysm. 5. Mild atherosclerotic plaque in the aorta and bilateral common femoral arteries without significant stenosis. 6. Dextroconvex scoliosis of the lumbar spine with associated multilevel degenerative disc disease. 7. Small benign hepatic cysts. 8. Stable left adrenal adenoma. Signed, HCriselda Peaches MD, [facility] Electronically Signed   By: HJacqulynn CadetM.D.   On: 08/27/2021 13:24   MR Angiogram Chest W Wo Contrast  Result Date: 08/27/2021 CLINICAL DATA:  Thoracic aortic aneurysm follow-up EXAM: MRA CHEST ABDOMEN AND PELVIS WITH CONTRAST TECHNIQUE: Multiplanar, multiecho pulse sequences of the chest abdomen and pelvis were obtained with intravenous contrast. Angiographic images of abdomen and pelvis were obtained using MRA technique with intravenous contrast. CONTRAST:  84mGADAVIST GADOBUTROL 1 MMOL/ML IV SOLN COMPARISON:  None. FINDINGS: MRA CHEST Vascular Mild fusiform aneurysmal dilation of the tubular portion of the ascending thoracic aorta with a maximal diameter of 4.1 cm. This is perhaps incrementally larger compared to 4 cm measured previously. The aortic root remains normal in size as does the transverse and descending thoracic aorta. The descending thoracic aorta is tortuous. Conventional 3 vessel arch anatomy. No evidence of dissection. The heart is normal in size. Unremarkable main pulmonary artery. Non Vascular No focal signal abnormality or abnormal enhancement within the lungs, pleura, mediastinum or musculoskeletal  structures. No pleural effusion. MRA ABDOMEN Vascular Aorta: No evidence of aneurysm or dissection. Celiac: Widely patent. No evidence of aneurysm, dissection or significant stenosis. SMA: Widely patent. No evidence of aneurysm, dissection or significant stenosis. Renals: Solitary renal arteries without evidence of aneurysm, dissection, significant stenosis or FMD. IMA: Patent without evidence of stenosis, dissection or aneurysm. Veins: No focal abnormalities. Non Vascular Multiple small circumscribed T2 hyperintense T1 hypointense nonenhancing lesions scattered throughout the liver consistent with small simple cysts. Stable 2.2 cm left adrenal adenoma dating back to November of 2018. Dextroconvex scoliosis of the lumbar spine with multilevel degenerative disc disease. Otherwise, no focal signal abnormality or abnormal enhancement involving the spleen, pancreas, kidneys, stomach, bowel, or lower genitourinary tract. MRA PELVIS Vascular Inflow: Tortuous with but patent. No evidence of aneurysm, dissection, stenosis or occlusion. Proximal Outflow: No evidence of aneurysm, dissection, stenosis or occlusion. Atherosclerotic plaque visualized along the posterior walls of the common femoral arteries bilaterally. Veins: Patent.  No focal abnormality. Non Vascular No signal abnormality or abnormal enhancement. IMPRESSION: 1. Essentially stable mild aneurysmal dilation of the ascending thoracic aorta with a maximal diameter of 4.1 cm. Four year stability is reassuring. This aortic diameter may be normal for the patient's body surface area. Consider follow-up surveillance imaging every other year. 2. Stable ectasia of the aortic root at 4.4 cm. 3. The distal descending thoracic aorta and upper abdominal aorta are tortuous but nonaneurysmal. 4. No evidence of abdominal aortic, iliac or visceral artery aneurysm. 5. Mild atherosclerotic plaque in the aorta and bilateral common femoral arteries without significant stenosis. 6.  Dextroconvex scoliosis of the lumbar spine with associated multilevel degenerative disc disease. 7. Small benign hepatic cysts. 8. Stable left adrenal adenoma. Signed, Criselda Peaches, MD, [facility] Electronically Signed   By: Jacqulynn Cadet M.D.   On: 08/27/2021 13:24    Assessment & Plan:   Azahel was seen today for hypertension, hypothyroidism, osteoarthritis and coronary artery disease.  Diagnoses and all orders for this visit:  Acquired hypothyroidism- He is euthyroid. -     TSH; Future -     TSH  CAD in native artery -     irbesartan (AVAPRO) 300 MG tablet; Take 1 tablet (300 mg total) by mouth daily. -     Discontinue: amLODipine (NORVASC) 5 MG tablet; Take 1 tablet (5 mg total) by mouth daily.  Essential hypertension- He has not achieved his blood pressure goal of 130/80.  Will increase the dose of irbesartan and will add amlodipine. -     Basic metabolic panel; Future -     Urinalysis, Routine w reflex microscopic; Future -     Urinalysis, Routine w reflex microscopic -     Basic metabolic panel -     irbesartan (AVAPRO) 300 MG tablet; Take 1 tablet (300 mg total) by mouth daily. -     Discontinue: amLODipine (NORVASC) 5 MG tablet; Take 1 tablet (  5 mg total) by mouth daily.  Thrombocytopenia ([place])- His platelets are normal now. -     CBC with Differential/Platelet; Future -     CBC with Differential/Platelet   I have discontinued Gregory Hale's irbesartan. I am also having him start on irbesartan. Additionally, I am having him maintain his aspirin EC, acetaminophen, and nitroGLYCERIN.  Meds ordered this encounter  Medications   irbesartan (AVAPRO) 300 MG tablet    Sig: Take 1 tablet (300 mg total) by mouth daily.    Dispense:  90 tablet    Refill:  1   DISCONTD: amLODipine (NORVASC) 5 MG tablet    Sig: Take 1 tablet (5 mg total) by mouth daily.    Dispense:  90 tablet    Refill:  0      Follow-up: Return in about 4 months (around 02/08/2023).  Scarlette Calico, MD

## 2022-10-10 NOTE — Patient Instructions (Signed)
Hypertension, Adult High blood pressure (hypertension) is when the force of blood pumping through the arteries is too strong. The arteries are the blood vessels that carry blood from the heart throughout the body. Hypertension forces the heart to work harder to pump blood and may cause arteries to become narrow or stiff. Untreated or uncontrolled hypertension can lead to a heart attack, heart failure, a stroke, kidney disease, and other problems. A blood pressure reading consists of a higher number over a lower number. Ideally, your blood pressure should be below 120/80. The first ("top") number is called the systolic pressure. It is a measure of the pressure in your arteries as your heart beats. The second ("bottom") number is called the diastolic pressure. It is a measure of the pressure in your arteries as the heart relaxes. What are the causes? The exact cause of this condition is not known. There are some conditions that result in high blood pressure. What increases the risk? Certain factors may make you more likely to develop high blood pressure. Some of these risk factors are under your control, including: Smoking. Not getting enough exercise or physical activity. Being overweight. Having too much fat, sugar, calories, or salt (sodium) in your diet. Drinking too much alcohol. Other risk factors include: Having a personal history of heart disease, diabetes, high cholesterol, or kidney disease. Stress. Having a family history of high blood pressure and high cholesterol. Having obstructive sleep apnea. Age. The risk increases with age. What are the signs or symptoms? High blood pressure may not cause symptoms. Very high blood pressure (hypertensive crisis) may cause: Headache. Fast or irregular heartbeats (palpitations). Shortness of breath. Nosebleed. Nausea and vomiting. Vision changes. Severe chest pain, dizziness, and seizures. How is this diagnosed? This condition is diagnosed by  measuring your blood pressure while you are seated, with your arm resting on a flat surface, your legs uncrossed, and your feet flat on the floor. The cuff of the blood pressure monitor will be placed directly against the skin of your upper arm at the level of your heart. Blood pressure should be measured at least twice using the same arm. Certain conditions can cause a difference in blood pressure between your right and left arms. If you have a high blood pressure reading during one visit or you have normal blood pressure with other risk factors, you may be asked to: Return on a different day to have your blood pressure checked again. Monitor your blood pressure at home for 1 week or longer. If you are diagnosed with hypertension, you may have other blood or imaging tests to help your health care provider understand your overall risk for other conditions. How is this treated? This condition is treated by making healthy lifestyle changes, such as eating healthy foods, exercising more, and reducing your alcohol intake. You may be referred for counseling on a healthy diet and physical activity. Your health care provider may prescribe medicine if lifestyle changes are not enough to get your blood pressure under control and if: Your systolic blood pressure is above 130. Your diastolic blood pressure is above 80. Your personal target blood pressure may vary depending on your medical conditions, your age, and other factors. Follow these instructions at home: Eating and drinking  Eat a diet that is high in fiber and potassium, and low in sodium, added sugar, and fat. An example of this eating plan is called the DASH diet. DASH stands for Dietary Approaches to Stop Hypertension. To eat this way: Eat   plenty of fresh fruits and vegetables. Try to fill one half of your plate at each meal with fruits and vegetables. Eat whole grains, such as whole-wheat pasta, brown rice, or whole-grain bread. Fill about one  fourth of your plate with whole grains. Eat or drink low-fat dairy products, such as skim milk or low-fat yogurt. Avoid fatty cuts of meat, processed or cured meats, and poultry with skin. Fill about one fourth of your plate with lean proteins, such as fish, chicken without skin, beans, eggs, or tofu. Avoid pre-made and processed foods. These tend to be higher in sodium, added sugar, and fat. Reduce your daily sodium intake. Many people with hypertension should eat less than 1,500 mg of sodium a day. Do not drink alcohol if: Your health care provider tells you not to drink. You are pregnant, may be pregnant, or are planning to become pregnant. If you drink alcohol: Limit how much you have to: 0-1 drink a day for women. 0-2 drinks a day for men. Know how much alcohol is in your drink. In the [place] one drink equals one 12 oz bottle of beer (355 mL), one 5 oz glass of wine (148 mL), or one 1 oz glass of hard liquor (44 mL). Lifestyle  Work with your health care provider to maintain a healthy body weight or to lose weight. Ask what an ideal weight is for you. Get at least 30 minutes of exercise that causes your heart to beat faster (aerobic exercise) most days of the week. Activities may include walking, swimming, or biking. Include exercise to strengthen your muscles (resistance exercise), such as Pilates or lifting weights, as part of your weekly exercise routine. Try to do these types of exercises for 30 minutes at least 3 days a week. Do not use any products that contain nicotine or tobacco. These products include cigarettes, chewing tobacco, and vaping devices, such as e-cigarettes. If you need help quitting, ask your health care provider. Monitor your blood pressure at home as told by your health care provider. Keep all follow-up visits. This is important. Medicines Take over-the-counter and prescription medicines only as told by your health care provider. Follow directions carefully. Blood  pressure medicines must be taken as prescribed. Do not skip doses of blood pressure medicine. Doing this puts you at risk for problems and can make the medicine less effective. Ask your health care provider about side effects or reactions to medicines that you should watch for. Contact a health care provider if you: Think you are having a reaction to a medicine you are taking. Have headaches that keep coming back (recurring). Feel dizzy. Have swelling in your ankles. Have trouble with your vision. Get help right away if you: Develop a severe headache or confusion. Have unusual weakness or numbness. Feel faint. Have severe pain in your chest or abdomen. Vomit repeatedly. Have trouble breathing. These symptoms may be an emergency. Get help right away. Call 911. Do not wait to see if the symptoms will go away. Do not drive yourself to the hospital. Summary Hypertension is when the force of blood pumping through your arteries is too strong. If this condition is not controlled, it may put you at risk for serious complications. Your personal target blood pressure may vary depending on your medical conditions, your age, and other factors. For most people, a normal blood pressure is less than 120/80. Hypertension is treated with lifestyle changes, medicines, or a combination of both. Lifestyle changes include losing weight, eating a healthy,   low-sodium diet, exercising more, and limiting alcohol. This information is not intended to replace advice given to you by your health care provider. Make sure you discuss any questions you have with your health care provider. Document Revised: 07/20/2021 Document Reviewed: 07/20/2021 Elsevier Patient Education  2023 [employer]  

## 2022-10-12 ENCOUNTER — Encounter: Payer: Self-pay | Admitting: Cardiovascular Disease

## 2022-10-12 ENCOUNTER — Encounter: Payer: Self-pay | Admitting: Internal Medicine

## 2022-10-12 DIAGNOSIS — I2510 Atherosclerotic heart disease of native coronary artery without angina pectoris: Secondary | ICD-10-CM

## 2022-10-12 DIAGNOSIS — M159 Polyosteoarthritis, unspecified: Secondary | ICD-10-CM

## 2022-10-12 DIAGNOSIS — I10 Essential (primary) hypertension: Secondary | ICD-10-CM

## 2022-10-12 DIAGNOSIS — M545 Low back pain, unspecified: Secondary | ICD-10-CM

## 2022-10-12 MED ORDER — CELECOXIB 100 MG PO CAPS: 100.0000 mg | ORAL_CAPSULE | Freq: Two times a day (BID) | ORAL | 0 refills | Status: DC

## 2022-10-12 MED ORDER — ATORVASTATIN CALCIUM 80 MG PO TABS: 80.0000 mg | ORAL_TABLET | Freq: Every evening | ORAL | 3 refills | Status: DC

## 2022-10-12 MED ORDER — METOPROLOL SUCCINATE ER 25 MG PO TB24: 12.5000 mg | ORAL_TABLET | Freq: Every day | ORAL | 1 refills | Status: DC

## 2022-10-14 ENCOUNTER — Other Ambulatory Visit: Payer: Self-pay

## 2022-10-14 DIAGNOSIS — I10 Essential (primary) hypertension: Secondary | ICD-10-CM

## 2022-10-14 DIAGNOSIS — I2510 Atherosclerotic heart disease of native coronary artery without angina pectoris: Secondary | ICD-10-CM

## 2022-10-14 MED ORDER — AMLODIPINE BESYLATE 5 MG PO TABS: 5.0000 mg | ORAL_TABLET | Freq: Every day | ORAL | 0 refills | Status: DC

## 2022-11-16 ENCOUNTER — Telehealth: Payer: Self-pay

## 2022-11-16 NOTE — Telephone Encounter (Signed)
Contacted [name] to schedule their annual wellness visit. Patient declined to schedule AWV at this time.  Norton Blizzard, [place] ([facility])  [place] [facility] [phone]

## 2022-12-06 ENCOUNTER — Ambulatory Visit (INDEPENDENT_AMBULATORY_CARE_PROVIDER_SITE_OTHER): Payer: Medicare Other | Admitting: Podiatry

## 2022-12-06 ENCOUNTER — Encounter: Payer: Self-pay | Admitting: Podiatry

## 2022-12-06 DIAGNOSIS — M21611 Bunion of right foot: Secondary | ICD-10-CM

## 2022-12-06 DIAGNOSIS — B351 Tinea unguium: Secondary | ICD-10-CM | POA: Diagnosis not present

## 2022-12-06 DIAGNOSIS — M2041 Other hammer toe(s) (acquired), right foot: Secondary | ICD-10-CM | POA: Diagnosis not present

## 2022-12-06 DIAGNOSIS — M2011 Hallux valgus (acquired), right foot: Secondary | ICD-10-CM

## 2022-12-06 MED ORDER — TERBINAFINE HCL 250 MG PO TABS: 250.0000 mg | ORAL_TABLET | Freq: Every day | ORAL | 0 refills | Status: DC

## 2022-12-06 NOTE — Patient Instructions (Signed)
More silicone pads can be purchased from:  [email or web address]  

## 2022-12-06 NOTE — Progress Notes (Signed)
  Subjective:  Patient ID: Gregory Hale, male    DOB: June 11, 1951,  MRN: [id number]  Chief Complaint  Patient presents with   Pricilla Larsson    NP- Blister on R foot 2nd toe    72 y.o. male presents with the above complaint. History confirmed with patient.  He notes a blister rubbing on the right second toe started a few months ago, since it is improving he has not had any drainage or need to bandage it recently.  He also has nail fungus, was on Lamisil for about a month previously  Objective:  Physical Exam: warm, good capillary refill, no trophic changes or ulcerative lesions, normal DP and PT pulses, normal sensory exam, onychomycosis, and bilaterally has hallux valgus deformity and hammertoe contractures, worse on the right than left, preulcerative callus on the dorsal PIPJ.      Assessment:   1. Hammertoe of second toe of right foot   2. Onychomycosis   3. Hallux valgus with bunions, right      Plan:  Patient was evaluated and treated and all questions answered.  We discussed the etiology of the blister and callus how relates to his hammertoe deformities and hallux valgus deformity.  I recommended offloading with silicone pad and these were dispensed.  He will utilize these.  Discussed if not improving that surgical correction could be undertaken to alleviate the issue.  Follow-up as needed for this  Regarding his onychomycosis he was previously on Lamisil for 90-day course.  Does not recall how much improvement he had.  He would like to try to treat it once more.  Rx for Lamisil sent to pharmacy to begin a second course for 90 days.  Possible side effects and interactions discussed.  Photographs taken I will see him back in 3 months to reevaluate.  Return in about 3 months (around 03/08/2023) for follow up after nail fungus treatment.

## 2022-12-20 ENCOUNTER — Encounter: Payer: Self-pay | Admitting: Internal Medicine

## 2022-12-20 DIAGNOSIS — I2510 Atherosclerotic heart disease of native coronary artery without angina pectoris: Secondary | ICD-10-CM

## 2022-12-20 DIAGNOSIS — I10 Essential (primary) hypertension: Secondary | ICD-10-CM

## 2022-12-21 MED ORDER — AMLODIPINE BESYLATE 5 MG PO TABS: 5.0000 mg | ORAL_TABLET | Freq: Every day | ORAL | 0 refills | Status: DC

## 2023-01-30 ENCOUNTER — Telehealth: Payer: Self-pay

## 2023-01-30 NOTE — Telephone Encounter (Signed)
Left voice mail for pt to call back to schedule medicare wellness visit

## 2023-02-06 ENCOUNTER — Encounter: Payer: Self-pay | Admitting: Internal Medicine

## 2023-02-06 ENCOUNTER — Ambulatory Visit (INDEPENDENT_AMBULATORY_CARE_PROVIDER_SITE_OTHER): Payer: Medicare Other | Admitting: Internal Medicine

## 2023-02-06 VITALS — BP 142/74 | HR 58 | Temp 97.7°F | Ht 68.0 in | Wt 196.0 lb

## 2023-02-06 DIAGNOSIS — I2510 Atherosclerotic heart disease of native coronary artery without angina pectoris: Secondary | ICD-10-CM

## 2023-02-06 DIAGNOSIS — I10 Essential (primary) hypertension: Secondary | ICD-10-CM | POA: Diagnosis not present

## 2023-02-06 DIAGNOSIS — R001 Bradycardia, unspecified: Secondary | ICD-10-CM

## 2023-02-06 DIAGNOSIS — L989 Disorder of the skin and subcutaneous tissue, unspecified: Secondary | ICD-10-CM | POA: Insufficient documentation

## 2023-02-06 DIAGNOSIS — I77810 Thoracic aortic ectasia: Secondary | ICD-10-CM | POA: Diagnosis not present

## 2023-02-06 DIAGNOSIS — T50905A Adverse effect of unspecified drugs, medicaments and biological substances, initial encounter: Secondary | ICD-10-CM | POA: Diagnosis not present

## 2023-02-06 LAB — BASIC METABOLIC PANEL
BUN: 11 mg/dL (ref 6–23)
CO2: 30 mEq/L (ref 19–32)
Calcium: 9.4 mg/dL (ref 8.4–10.5)
Chloride: 101 mEq/L (ref 96–112)
Creatinine, Ser: 1 mg/dL (ref 0.40–1.50)
GFR: 76.11 mL/min (ref >60.00)
Glucose, Bld: 87 mg/dL (ref 70–99)
Potassium: 5 mEq/L (ref 3.5–5.1)
Sodium: 137 mEq/L (ref 135–145)

## 2023-02-06 LAB — TSH: TSH: 2.68 u[IU]/mL (ref 0.35–5.50)

## 2023-02-06 NOTE — Progress Notes (Signed)
Subjective:  Patient ID: Gregory Hale, male    DOB: Jul 20, 1951  Age: 72 y.o. MRN: [id number]  CC: Hypertension, Coronary Artery Disease, Hyperlipidemia, and Hypothyroidism   HPI Gregory Hale presents for f/up ---  He is active and denies chest pain, shortness of breath, diaphoresis, edema, dizziness, lightheadedness, or near syncope.  Outpatient Medications Prior to Visit  Medication Sig Dispense Refill   acetaminophen (TYLENOL) 500 MG tablet Take 500 mg by mouth every 6 (six) hours as needed.     amLODipine (NORVASC) 5 MG tablet Take 1 tablet (5 mg total) by mouth daily. 90 tablet 0   aspirin EC 81 MG tablet Take 1 tablet (81 mg total) by mouth daily. Swallow whole. 90 tablet 3   atorvastatin (LIPITOR) 80 MG tablet Take 1 tablet (80 mg total) by mouth every evening. 90 tablet 3   celecoxib (CELEBREX) 100 MG capsule Take 1 capsule (100 mg total) by mouth 2 (two) times daily. 180 capsule 0   irbesartan (AVAPRO) 300 MG tablet Take 1 tablet (300 mg total) by mouth daily. 90 tablet 1   metoprolol succinate (TOPROL-XL) 25 MG 24 hr tablet Take 0.5 tablets (12.5 mg total) by mouth daily. 45 tablet 1   nitroGLYCERIN (NITROSTAT) 0.4 MG SL tablet Place 1 tablet (0.4 mg total) under the tongue every 5 (five) minutes as needed. 25 tablet 3   terbinafine (LAMISIL) 250 MG tablet Take 1 tablet (250 mg total) by mouth daily. 90 tablet 0   No facility-administered medications prior to visit.    ROS Review of Systems  Constitutional: Negative.  Negative for diaphoresis and fatigue.  HENT: Negative.    Eyes: Negative.   Respiratory:  Negative for cough, chest tightness and wheezing.   Cardiovascular:  Negative for chest pain, palpitations and leg swelling.  Gastrointestinal:  Negative for abdominal pain, constipation, diarrhea, nausea and vomiting.  Endocrine: Negative.   Genitourinary: Negative.  Negative for difficulty urinating.  Musculoskeletal: Negative.  Negative for arthralgias and  myalgias.  Skin:  Positive for color change.  Neurological:  Negative for dizziness, weakness, light-headedness and headaches.  Hematological:  Negative for adenopathy. Does not bruise/bleed easily.  Psychiatric/Behavioral: Negative.      Objective:  BP (!) 142/74 (BP Location: Right Arm, Patient Position: Sitting, Cuff Size: Large)   Pulse (!) 58   Temp 97.7 F (36.5 C) (Oral)   Ht 5\' 8"  (1.727 m)   Wt 196 lb (88.9 kg)   SpO2 98%   BMI 29.80 kg/m   BP Readings from Last 3 Encounters:  02/06/23 (!) 142/74  10/10/22 (!) 144/86  09/20/22 128/78    Wt Readings from Last 3 Encounters:  02/06/23 196 lb (88.9 kg)  10/10/22 196 lb (88.9 kg)  09/20/22 194 lb 12.8 oz (88.4 kg)    Physical Exam Vitals reviewed.  HENT:     Nose: Nose normal.     Mouth/Throat:     Mouth: Mucous membranes are moist.  Eyes:     General: No scleral icterus.    Conjunctiva/sclera: Conjunctivae normal.  Cardiovascular:     Rate and Rhythm: Regular rhythm. Bradycardia present.     Pulses: Normal pulses.     Heart sounds: No murmur heard.    No friction rub. No gallop.     Comments: EKG- SB, 46 bpm [place] Q wave in III No LVH or acute ST/T wave changes Pulmonary:     Effort: Pulmonary effort is normal.     Breath sounds: No stridor. No  wheezing, rhonchi or rales.  Abdominal:     General: Abdomen is flat.     Palpations: There is no mass.     Tenderness: There is no abdominal tenderness. There is no guarding.     Hernia: No hernia is present.  Musculoskeletal:        General: Normal range of motion.     Cervical back: Neck supple.     Right lower leg: No edema.     Left lower leg: No edema.  Lymphadenopathy:     Cervical: No cervical adenopathy.  Skin:    General: Skin is warm and dry.  Neurological:     General: No focal deficit present.     Mental Status: He is alert.  Psychiatric:        Mood and Affect: Mood normal.        Behavior: Behavior normal.     Lab Results  Component  Value Date   WBC 6.2 10/10/2022   HGB 15.2 10/10/2022   HCT 44.9 10/10/2022   PLT 163.0 10/10/2022   GLUCOSE 87 02/06/2023   CHOL 121 04/18/2022   TRIG 48.0 04/18/2022   HDL 63.00 04/18/2022   LDLCALC 48 04/18/2022   ALT 21 04/18/2022   AST 28 04/18/2022   NA 137 02/06/2023   K 5.0 02/06/2023   CL 101 02/06/2023   CREATININE 1.00 02/06/2023   BUN 11 02/06/2023   CO2 30 02/06/2023   TSH 2.68 02/06/2023   PSA 0.72 04/18/2022   HGBA1C 5.7 08/30/2021    MR ANGIO PELVIS W WO CONTRAST  Result Date: 08/27/2021 CLINICAL DATA:  Thoracic aortic aneurysm follow-up EXAM: MRA CHEST ABDOMEN AND PELVIS WITH CONTRAST TECHNIQUE: Multiplanar, multiecho pulse sequences of the chest abdomen and pelvis were obtained with intravenous contrast. Angiographic images of abdomen and pelvis were obtained using MRA technique with intravenous contrast. CONTRAST:  8mL GADAVIST GADOBUTROL 1 MMOL/ML IV SOLN COMPARISON:  None. FINDINGS: MRA CHEST Vascular Mild fusiform aneurysmal dilation of the tubular portion of the ascending thoracic aorta with a maximal diameter of 4.1 cm. This is perhaps incrementally larger compared to 4 cm measured previously. The aortic root remains normal in size as does the transverse and descending thoracic aorta. The descending thoracic aorta is tortuous. Conventional 3 vessel arch anatomy. No evidence of dissection. The heart is normal in size. Unremarkable main pulmonary artery. Non Vascular No focal signal abnormality or abnormal enhancement within the lungs, pleura, mediastinum or musculoskeletal structures. No pleural effusion. MRA ABDOMEN Vascular Aorta: No evidence of aneurysm or dissection. Celiac: Widely patent. No evidence of aneurysm, dissection or significant stenosis. SMA: Widely patent. No evidence of aneurysm, dissection or significant stenosis. Renals: Solitary renal arteries without evidence of aneurysm, dissection, significant stenosis or FMD. IMA: Patent without evidence of  stenosis, dissection or aneurysm. Veins: No focal abnormalities. Non Vascular Multiple small circumscribed T2 hyperintense T1 hypointense nonenhancing lesions scattered throughout the liver consistent with small simple cysts. Stable 2.2 cm left adrenal adenoma dating back to November of 2018. Dextroconvex scoliosis of the lumbar spine with multilevel degenerative disc disease. Otherwise, no focal signal abnormality or abnormal enhancement involving the spleen, pancreas, kidneys, stomach, bowel, or lower genitourinary tract. MRA PELVIS Vascular Inflow: Tortuous with but patent. No evidence of aneurysm, dissection, stenosis or occlusion. Proximal Outflow: No evidence of aneurysm, dissection, stenosis or occlusion. Atherosclerotic plaque visualized along the posterior walls of the common femoral arteries bilaterally. Veins: Patent.  No focal abnormality. Non Vascular No signal abnormality or abnormal  enhancement. IMPRESSION: 1. Essentially stable mild aneurysmal dilation of the ascending thoracic aorta with a maximal diameter of 4.1 cm. Four year stability is reassuring. This aortic diameter may be normal for the patient's body surface area. Consider follow-up surveillance imaging every other year. 2. Stable ectasia of the aortic root at 4.4 cm. 3. The distal descending thoracic aorta and upper abdominal aorta are tortuous but nonaneurysmal. 4. No evidence of abdominal aortic, iliac or visceral artery aneurysm. 5. Mild atherosclerotic plaque in the aorta and bilateral common femoral arteries without significant stenosis. 6. Dextroconvex scoliosis of the lumbar spine with associated multilevel degenerative disc disease. 7. Small benign hepatic cysts. 8. Stable left adrenal adenoma. Signed, Sterling Big, MD, RPVI [facility] Radiology Electronically Signed   By: Malachy Moan M.D.   On: 08/27/2021 13:24   MR ANGIO ABDOMEN W WO CONTRAST  Result Date:  08/27/2021 CLINICAL DATA:  Thoracic aortic aneurysm follow-up EXAM: MRA CHEST ABDOMEN AND PELVIS WITH CONTRAST TECHNIQUE: Multiplanar, multiecho pulse sequences of the chest abdomen and pelvis were obtained with intravenous contrast. Angiographic images of abdomen and pelvis were obtained using MRA technique with intravenous contrast. CONTRAST:  8mL GADAVIST GADOBUTROL 1 MMOL/ML IV SOLN COMPARISON:  None. FINDINGS: MRA CHEST Vascular Mild fusiform aneurysmal dilation of the tubular portion of the ascending thoracic aorta with a maximal diameter of 4.1 cm. This is perhaps incrementally larger compared to 4 cm measured previously. The aortic root remains normal in size as does the transverse and descending thoracic aorta. The descending thoracic aorta is tortuous. Conventional 3 vessel arch anatomy. No evidence of dissection. The heart is normal in size. Unremarkable main pulmonary artery. Non Vascular No focal signal abnormality or abnormal enhancement within the lungs, pleura, mediastinum or musculoskeletal structures. No pleural effusion. MRA ABDOMEN Vascular Aorta: No evidence of aneurysm or dissection. Celiac: Widely patent. No evidence of aneurysm, dissection or significant stenosis. SMA: Widely patent. No evidence of aneurysm, dissection or significant stenosis. Renals: Solitary renal arteries without evidence of aneurysm, dissection, significant stenosis or FMD. IMA: Patent without evidence of stenosis, dissection or aneurysm. Veins: No focal abnormalities. Non Vascular Multiple small circumscribed T2 hyperintense T1 hypointense nonenhancing lesions scattered throughout the liver consistent with small simple cysts. Stable 2.2 cm left adrenal adenoma dating back to November of 2018. Dextroconvex scoliosis of the lumbar spine with multilevel degenerative disc disease. Otherwise, no focal signal abnormality or abnormal enhancement involving the spleen, pancreas, kidneys, stomach, bowel, or lower genitourinary  tract. MRA PELVIS Vascular Inflow: Tortuous with but patent. No evidence of aneurysm, dissection, stenosis or occlusion. Proximal Outflow: No evidence of aneurysm, dissection, stenosis or occlusion. Atherosclerotic plaque visualized along the posterior walls of the common femoral arteries bilaterally. Veins: Patent.  No focal abnormality. Non Vascular No signal abnormality or abnormal enhancement. IMPRESSION: 1. Essentially stable mild aneurysmal dilation of the ascending thoracic aorta with a maximal diameter of 4.1 cm. Four year stability is reassuring. This aortic diameter may be normal for the patient's body surface area. Consider follow-up surveillance imaging every other year. 2. Stable ectasia of the aortic root at 4.4 cm. 3. The distal descending thoracic aorta and upper abdominal aorta are tortuous but nonaneurysmal. 4. No evidence of abdominal aortic, iliac or visceral artery aneurysm. 5. Mild atherosclerotic plaque in the aorta and bilateral common femoral arteries without significant stenosis. 6. Dextroconvex scoliosis of the lumbar spine with associated multilevel degenerative disc disease. 7. Small benign hepatic cysts. 8. Stable left adrenal adenoma. Signed,  Sterling Big, MD, RPVI [facility] Radiology Electronically Signed   By: Malachy Moan M.D.   On: 08/27/2021 13:24   MR Angiogram Chest W Wo Contrast  Result Date: 08/27/2021 CLINICAL DATA:  Thoracic aortic aneurysm follow-up EXAM: MRA CHEST ABDOMEN AND PELVIS WITH CONTRAST TECHNIQUE: Multiplanar, multiecho pulse sequences of the chest abdomen and pelvis were obtained with intravenous contrast. Angiographic images of abdomen and pelvis were obtained using MRA technique with intravenous contrast. CONTRAST:  8mL GADAVIST GADOBUTROL 1 MMOL/ML IV SOLN COMPARISON:  None. FINDINGS: MRA CHEST Vascular Mild fusiform aneurysmal dilation of the tubular portion of the ascending thoracic aorta  with a maximal diameter of 4.1 cm. This is perhaps incrementally larger compared to 4 cm measured previously. The aortic root remains normal in size as does the transverse and descending thoracic aorta. The descending thoracic aorta is tortuous. Conventional 3 vessel arch anatomy. No evidence of dissection. The heart is normal in size. Unremarkable main pulmonary artery. Non Vascular No focal signal abnormality or abnormal enhancement within the lungs, pleura, mediastinum or musculoskeletal structures. No pleural effusion. MRA ABDOMEN Vascular Aorta: No evidence of aneurysm or dissection. Celiac: Widely patent. No evidence of aneurysm, dissection or significant stenosis. SMA: Widely patent. No evidence of aneurysm, dissection or significant stenosis. Renals: Solitary renal arteries without evidence of aneurysm, dissection, significant stenosis or FMD. IMA: Patent without evidence of stenosis, dissection or aneurysm. Veins: No focal abnormalities. Non Vascular Multiple small circumscribed T2 hyperintense T1 hypointense nonenhancing lesions scattered throughout the liver consistent with small simple cysts. Stable 2.2 cm left adrenal adenoma dating back to November of 2018. Dextroconvex scoliosis of the lumbar spine with multilevel degenerative disc disease. Otherwise, no focal signal abnormality or abnormal enhancement involving the spleen, pancreas, kidneys, stomach, bowel, or lower genitourinary tract. MRA PELVIS Vascular Inflow: Tortuous with but patent. No evidence of aneurysm, dissection, stenosis or occlusion. Proximal Outflow: No evidence of aneurysm, dissection, stenosis or occlusion. Atherosclerotic plaque visualized along the posterior walls of the common femoral arteries bilaterally. Veins: Patent.  No focal abnormality. Non Vascular No signal abnormality or abnormal enhancement. IMPRESSION: 1. Essentially stable mild aneurysmal dilation of the ascending thoracic aorta with a maximal diameter of 4.1 cm.  Four year stability is reassuring. This aortic diameter may be normal for the patient's body surface area. Consider follow-up surveillance imaging every other year. 2. Stable ectasia of the aortic root at 4.4 cm. 3. The distal descending thoracic aorta and upper abdominal aorta are tortuous but nonaneurysmal. 4. No evidence of abdominal aortic, iliac or visceral artery aneurysm. 5. Mild atherosclerotic plaque in the aorta and bilateral common femoral arteries without significant stenosis. 6. Dextroconvex scoliosis of the lumbar spine with associated multilevel degenerative disc disease. 7. Small benign hepatic cysts. 8. Stable left adrenal adenoma. Signed, Sterling Big, MD, RPVI [facility] Radiology Electronically Signed   By: Malachy Moan M.D.   On: 08/27/2021 13:24    Assessment & Plan:   Bradycardia, drug induced- Labs are negative for secondary causes.  He has chronotropic competence.  No changes in antihypertensives needed. -     TSH; Future -     EKG 12-Lead -     Ambulatory referral to Cardiology  Ascending aorta dilatation (HCC)- He is due for recheck in 6 months.  Essential hypertension- His blood pressure is not adequately well-controlled.  He will continue working on his lifestyle modifications. -     EKG 12-Lead -  Basic metabolic panel; Future  CAD in native artery -     Ambulatory referral to Cardiology  Skin lesion of scalp -     Ambulatory referral to Dermatology     Follow-up: Return in about 4 months (around 06/09/2023).  Sanda Linger, MD

## 2023-02-06 NOTE — Patient Instructions (Signed)
Bradycardia, Adult Bradycardia is a slower-than-normal heartbeat. A normal resting heart rate for an adult ranges from 60 to 100 beats per minute. With bradycardia, the resting heart rate is less than 60 beats per minute. Bradycardia can prevent enough oxygen from reaching certain areas of your body when you are active. It can be serious if it keeps enough oxygen from reaching your brain and other parts of your body. Bradycardia is not a problem for everyone. For some healthy adults, a slow resting heart rate is normal. What are the causes? This condition may be caused by: A problem with the heart, including: A problem with the heart's electrical system, such as a heart block. With a heart block, electrical signals between the chambers of the heart are partially or completely blocked, so they are not able to work as they should. A problem with the heart's natural pacemaker (sinus node). Heart disease. A heart attack. Heart damage. Lyme disease. A heart infection. A heart condition that is present at birth (congenital heart defect). Certain medicines that treat heart conditions. Certain conditions, such as hypothyroidism and obstructive sleep apnea. Problems with the balance of chemicals and other substances, like potassium, in the blood. Trauma. Radiation therapy. What increases the risk? You are more likely to develop this condition if you: Are age 65 or older. Have high blood pressure (hypertension), high cholesterol (hyperlipidemia), or diabetes. Drink heavily, use tobacco or nicotine products, or use drugs. What are the signs or symptoms? Symptoms of this condition include: Light-headedness. Feeling faint or fainting. Fatigue and weakness. Trouble with activity or exercise. Shortness of breath. Chest pain (angina). Drowsiness. Confusion. Dizziness. How is this diagnosed? This condition may be diagnosed based on: Your symptoms. Your medical history. A physical exam. During  the exam, your health care provider will listen to your heartbeat and check your pulse. To confirm the diagnosis, your health care provider may order tests, such as: Blood tests. An electrocardiogram (ECG). This test records the heart's electrical activity. The test can show how fast your heart is beating and whether the heartbeat is steady. A test in which you wear a portable device (event recorder or Holter monitor) to record your heart's electrical activity while you go about your day. An exercise test. How is this treated? Treatment for this condition depends on the cause of the condition and how severe your symptoms are. Treatment may involve: Treatment of the underlying condition. Changing your medicines or how much medicine you take. Having a small, battery-operated device called a pacemaker implanted under the skin. When bradycardia occurs, this device can be used to increase your heart rate and help your heart beat in a regular rhythm. Follow these instructions at home: Lifestyle Manage any health conditions that contribute to bradycardia as told by your health care provider. Follow a heart-healthy diet. A nutrition specialist (dietitian) can help educate you about healthy food options and changes. Follow an exercise program that is approved by your health care provider. Maintain a healthy weight. Try to reduce or manage your stress, such as with yoga or meditation. If you need help reducing stress, ask your health care provider. Do not use any products that contain nicotine or tobacco. These products include cigarettes, chewing tobacco, and vaping devices, such as e-cigarettes. If you need help quitting, ask your health care provider. Do not use illegal drugs. Alcohol use If you drink alcohol: Limit how much you have to: 0-1 drink a day for women who are not pregnant. 0-2 drinks a day   for men. Know how much alcohol is in a drink. In the [place] one drink equals one 12 oz bottle of  beer (355 mL), one 5 oz glass of wine (148 mL), or one 1 oz glass of hard liquor (44 mL). General instructions Take over-the-counter and prescription medicines only as told by your health care provider. Keep all follow-up visits. This is important. How is this prevented? In some cases, bradycardia may be prevented by: Treating underlying medical problems. Stopping behaviors or medicines that can trigger the condition. Contact a health care provider if: You feel light-headed or dizzy. You almost faint. You feel weak or are easily fatigued during physical activity. You experience confusion or have memory problems. Get help right away if: You faint. You have chest pains or an irregular heartbeat (palpitations). You have trouble breathing. These symptoms may represent a serious problem that is an emergency. Do not wait to see if the symptoms will go away. Get medical help right away. Call your local emergency services (911 in the [place] Do not drive yourself to the hospital. Summary Bradycardia is a slower-than-normal heartbeat. With bradycardia, the resting heart rate is less than 60 beats per minute. Treatment for this condition depends on the cause. Manage any health conditions that contribute to bradycardia as told by your health care provider. Do not use any products that contain nicotine or tobacco. These products include cigarettes, chewing tobacco, and vaping devices, such as e-cigarettes. Keep all follow-up visits. This is important. This information is not intended to replace advice given to you by your health care provider. Make sure you discuss any questions you have with your health care provider. Document Revised: 01/03/2021 Document Reviewed: 01/03/2021 Elsevier Patient Education  2023 [employer]  

## 2023-02-08 ENCOUNTER — Ambulatory Visit: Payer: Medicare Other | Admitting: Internal Medicine

## 2023-02-08 NOTE — Progress Notes (Deleted)
Office Visit    Patient Name: Gregory Hale Date of Encounter: 02/08/2023  Primary Care Provider:  Etta Grandchild, MD Primary Cardiologist:  Verne Carrow, MD Primary Electrophysiologist: None   Past Medical History    Past Medical History:  Diagnosis Date   Arthritis    Ascending aorta dilatation (HCC)    Bradycardia    a. HR 40 in setting of acute inferior STEMI 03/2017.   CAD in native artery    a. inf STEMI 03/2017 a/w hypotension and bradycardia -> s/p DES to RCA, otherwise mild disease in LAD, moderate disease in ostium of the moderate caliber diagonal branch, normal LVEF >65%   Dilated aortic root (HCC)    Heart attack (HCC)    STEMI involving right coronary artery (HCC)    04/22/17 PCI/DES to mRCA, normal EF   Tobacco abuse    Quit in 1998   Past Surgical History:  Procedure Laterality Date   CORONARY STENT INTERVENTION N/A 04/22/2017   Procedure: Coronary Stent Intervention;  Surgeon: Kathleene Hazel, MD;  Location: [facility] INVASIVE CV LAB;  Service: Cardiovascular;  Laterality: N/A;   LEFT HEART CATH AND CORONARY ANGIOGRAPHY N/A 04/22/2017   Procedure: Left Heart Cath and Coronary Angiography;  Surgeon: Kathleene Hazel, MD;  Location: [facility] INVASIVE CV LAB;  Service: Cardiovascular;  Laterality: N/A;   parotid Left 1980   TONSILLECTOMY     as a child    Allergies  Allergies  Allergen Reactions   Advil [Ibuprofen] Other (See Comments)    bleeding     History of Present Illness    Gregory Hale is a 72 y.o. male with PMH of CAD s/p acute inferior STEMI treated with DES to occluded RCA, mild disease in the LAD and moderate disease in diagonal branch, ascending aortic dilation (stable at 41 mm), arthritis who presents today for complaint of drug-induced bradycardia.  Gregory Hale was initially seen by Dr. Clifton James in 2018 for inferior MI that occurred while playing golf.  While in the ED patient had loss of consciousness and heart rate in the 40s.   BP was documented at 50/20 and patient transferred to the Cath Lab for treatment. LHC showed thrombotic occlusion of RCA treated with DEs, otherwise mild disease in LAD, moderate disease in ostium of the moderate caliber diagonal branch, normal LVEF >65%. 2D Echo showed mild LVH, EF 60-65%, grade 1 DD, mildly dilated aortic root and ascending aorta, mild TR. He was able to be stared on standard post MI therapy including beta blocker and statin.  He was last seen by Dr. Clifton James on 08/2021 for follow-up.  During visit patient was doing well with no reports of chest pain, shortness of breath, palpitations.  He is very active and plays golf 3 to 4 days/week.  Most recent chest MRA 08/2021 showing stable ascending aortic aneurysm at 4.1 cm.  He was seen last in follow-up on 09/20/2022 and reported doing well with no new cardiac complaints.  His blood pressure was noted to be well-controlled at that time.  He was maintaining a busy lifestyle playing golf and compliant with his medications with no changes made to his regimen at that time.  Since last being seen in the office patient reports***.  Patient denies chest pain, palpitations, dyspnea, PND, orthopnea, nausea, vomiting, dizziness, syncope, edema, weight gain, or early satiety.     ***Notes:  Home Medications    Current Outpatient Medications  Medication Sig Dispense Refill   acetaminophen (TYLENOL) 500  MG tablet Take 500 mg by mouth every 6 (six) hours as needed.     amLODipine (NORVASC) 5 MG tablet Take 1 tablet (5 mg total) by mouth daily. 90 tablet 0   aspirin EC 81 MG tablet Take 1 tablet (81 mg total) by mouth daily. Swallow whole. 90 tablet 3   atorvastatin (LIPITOR) 80 MG tablet Take 1 tablet (80 mg total) by mouth every evening. 90 tablet 3   celecoxib (CELEBREX) 100 MG capsule Take 1 capsule (100 mg total) by mouth 2 (two) times daily. 180 capsule 0   irbesartan (AVAPRO) 300 MG tablet Take 1 tablet (300 mg total) by mouth daily. 90  tablet 1   metoprolol succinate (TOPROL-XL) 25 MG 24 hr tablet Take 0.5 tablets (12.5 mg total) by mouth daily. 45 tablet 1   nitroGLYCERIN (NITROSTAT) 0.4 MG SL tablet Place 1 tablet (0.4 mg total) under the tongue every 5 (five) minutes as needed. 25 tablet 3   terbinafine (LAMISIL) 250 MG tablet Take 1 tablet (250 mg total) by mouth daily. 90 tablet 0   No current facility-administered medications for this visit.     Review of Systems  Please see the history of present illness.    (+)*** (+)***  All other systems reviewed and are otherwise negative except as noted above.  Physical Exam    Wt Readings from Last 3 Encounters:  02/06/23 196 lb (88.9 kg)  10/10/22 196 lb (88.9 kg)  09/20/22 194 lb 12.8 oz (88.4 kg)   NW:GNFAO were no vitals filed for this visit.,There is no height or weight on file to calculate BMI.  Constitutional:      Appearance: Healthy appearance. Not in distress.  Neck:     Vascular: JVD normal.  Pulmonary:     Effort: Pulmonary effort is normal.     Breath sounds: No wheezing. No rales. Diminished in the bases Cardiovascular:     Normal rate. Regular rhythm. Normal S1. Normal S2.      Murmurs: There is no murmur.  Edema:    Peripheral edema absent.  Abdominal:     Palpations: Abdomen is soft non tender. There is no hepatomegaly.  Skin:    General: Skin is warm and dry.  Neurological:     General: No focal deficit present.     Mental Status: Alert and oriented to person, place and time.     Cranial Nerves: Cranial nerves are intact.  EKG/LABS/ Recent Cardiac Studies    ECG personally reviewed by me today - ***  Cardiac Studies & Procedures   CARDIAC CATHETERIZATION  CARDIAC CATHETERIZATION 04/22/2017  Narrative  Mid LAD lesion, 30 %stenosed.  Ost 2nd Diag lesion, 70 %stenosed.  A STENT RESOLUTE ONYX 5.0X18 drug eluting stent was successfully placed.  Prox RCA lesion, 100 %stenosed.  Post intervention, there is a 0% residual  stenosis.  The left ventricular systolic function is normal.  LV end diastolic pressure is normal.  The left ventricular ejection fraction is greater than 65% by visual estimate.  There is no mitral valve regurgitation.  1. Acute inferior STEMI secondary to thrombotic occlusion of the mid RCA 2. Successful PTCA/DES x 1 mid RCA 3. Mild disease in the mid LAD 4. Moderate disease in the ostium of the moderate caliber Diagonal branch 5. Normal LV systolic function  Recommendations: Will admit to ICU on telemetry. Will continue Cangrelor for 30 minutes post Brilinta. Will then use ASA, Brilinta for one year. Will start high dose statin and low  dose beta blocker. Echo tomorrow.  Findings Coronary Findings Diagnostic  Dominance: Right  Left Anterior Descending  First Diagonal Branch Vessel is small in size.  First Septal Branch Vessel is small in size.  Second Diagonal Branch  Second Septal Branch Vessel is small in size.  Third Diagonal Branch Vessel is small in size.  Third Septal Branch Vessel is small in size.  Ramus Intermedius Vessel is small.  Left Circumflex Vessel is moderate in size.  First Obtuse Marginal Branch Vessel is moderate in size.  Second Obtuse Marginal Branch Vessel is moderate in size.  Right Coronary Artery  Intervention  Prox RCA lesion Angioplasty Pre-stent angioplasty was performed using a BALLOON EUPHORA RX2.5X12. A STENT RESOLUTE ONYX 5.0X18 drug eluting stent was successfully placed. Stent strut is well apposed. Post-stent angioplasty was performed using a BALLOON  EMERGE MR 5.0X12. There is no pre-interventional antegrade distal flow (TIMI 0).  The post-interventional distal flow is normal (TIMI 3). The intervention was successful . No complications occurred at this lesion. There is a 0% residual stenosis post intervention.     ECHOCARDIOGRAM  ECHOCARDIOGRAM COMPLETE 04/23/2017  Narrative *[place]* *[facility]* [address] [place], [place] [address] [phone]  ------------------------------------------------------------------- Echocardiography  Patient:    Azi, Totin MR #:       [id number] Study Date: 04/23/2017 Gender:     M Age:        56 Height:     172.7 cm Weight:     83.5 kg BSA:        2.02 m^2 Pt. Status: Room:       2H18C  ADMITTING    Verne Carrow ATTENDING    Clifton James, Christopher ORDERING     McAlhany, Christopher Lauralee Evener SONOGRAPHER  Arvil Chaco PERFORMING   [facility], Inpatient  cc:  ------------------------------------------------------------------- LV EF: 60% -   65%  ------------------------------------------------------------------- Indications:      CAD of native vessels 414.01.  ------------------------------------------------------------------- History:   PMH:  Acute inferior STEMI S/P DES. Smoker in remission. Hyperlipidemia.  ------------------------------------------------------------------- Study Conclusions  - Left ventricle: The cavity size was normal. Wall thickness was increased in a pattern of mild LVH. Systolic function was normal. The estimated ejection fraction was in the range of 60% to 65%. Wall motion was normal; there were no regional wall motion abnormalities. Doppler parameters are consistent with abnormal left ventricular relaxation (grade 1 diastolic dysfunction). - Aortic root: The aortic root was mildly dilated. - Ascending aorta: The ascending aorta was mildly dilated. - Mitral valve: There was trivial regurgitation. - Right atrium: Central venous pressure (est): 3 mm Hg. - Atrial septum: No defect or patent foramen ovale was identified. - Tricuspid valve: There was mild regurgitation. - Pulmonary arteries: PA peak pressure: 24 mm Hg (S). - Pericardium, extracardiac: There was no pericardial effusion.  Impressions:  - Mild LVH with LVEF 60-65% and grade 1 diastolic  dysfunction. Trivial mitral regurgitation. Mildly dilated aortic root and ascending aorta. Mild tricuspid regurgitation with PASP 24 mmHg.  ------------------------------------------------------------------- Labs, prior tests, procedures, and surgery: Catheterization (current admission).    The study demonstrated coronary artery disease. There was a stenosis which was treated with a stent.  ------------------------------------------------------------------- Study data:  No prior study was available for comparison.  Study status:  Routine.  Procedure:  The patient reported no pain pre or post test. Transthoracic echocardiography. Image quality was adequate.  Study completion:  There were no complications. Echocardiography.  M-mode, complete 2D,  spectral Doppler, and color Doppler.  Birthdate:  Patient birthdate: 10-31-1950.  Age:  Patient is 72 yr old.  Sex:  Gender: male.    BMI: 28 kg/m^2.  Blood pressure:     113/81  Patient status:  Inpatient.  Study date: Study date: 04/23/2017. Study time: 01:51 PM.  Location:  ICU/CCU  -------------------------------------------------------------------  ------------------------------------------------------------------- Left ventricle:  The cavity size was normal. Wall thickness was increased in a pattern of mild LVH. Systolic function was normal. The estimated ejection fraction was in the range of 60% to 65%. Wall motion was normal; there were no regional wall motion abnormalities. Doppler parameters are consistent with abnormal left ventricular relaxation (grade 1 diastolic dysfunction).  ------------------------------------------------------------------- Aortic valve:   Trileaflet.  Doppler:  There was no significant regurgitation.  ------------------------------------------------------------------- Aorta:  Aortic root: The aortic root was mildly dilated. Ascending aorta: The ascending aorta was mildly  dilated.  ------------------------------------------------------------------- Mitral valve:   The valve appears to be grossly normal.    Doppler: There was trivial regurgitation.  ------------------------------------------------------------------- Left atrium:  The atrium was normal in size.  ------------------------------------------------------------------- Atrial septum:  No defect or patent foramen ovale was identified.  ------------------------------------------------------------------- Right ventricle:  The cavity size was normal. Systolic function was normal.  ------------------------------------------------------------------- Tricuspid valve:   The valve appears to be grossly normal. Doppler:  There was mild regurgitation.  ------------------------------------------------------------------- Right atrium:  The atrium was normal in size.  ------------------------------------------------------------------- Pericardium:  There was no pericardial effusion.  ------------------------------------------------------------------- Systemic veins: Inferior vena cava: The vessel was normal in size. The respirophasic diameter changes were in the normal range (>= 50%), consistent with normal central venous pressure.  ------------------------------------------------------------------- Measurements  Left ventricle                          Value        Reference LV ID, ED, PLAX chordal         (L)     37    mm     43 - 52 LV ID, ES, PLAX chordal         (L)     22.9  mm     23 - 38 LV fx shortening, PLAX chordal          38    %      >=29 LV PW thickness, ED                     11.9  mm     ---------- IVS/LV PW ratio, ED                     1            <=1.3 LV e&', lateral                          8.92  cm/s   ---------- LV E/e&', lateral                        7.17         ---------- LV e&', medial                           5.22  cm/s   ---------- LV E/e&', medial  12.26        ---------- LV e&', average                          7.07  cm/s   ---------- LV E/e&', average                        9.05         ----------  Ventricular septum                      Value        Reference IVS thickness, ED                       11.9  mm     ----------  LVOT                                    Value        Reference LVOT peak velocity, S                   130   cm/s   ---------- LVOT mean velocity, S                   75.3  cm/s   ---------- LVOT VTI, S                             31.9  cm     ---------- LVOT peak gradient, S                   7     mm Hg  ----------  Aorta                                   Value        Reference Aortic root ID, ED                      42    mm     ----------  Left atrium                             Value        Reference LA ID, A-P, ES                          47    mm     ---------- LA ID/bsa, A-P                  (H)     2.33  cm/m^2 <=2.2 LA volume, ES, 1-p A4C                  49.1  ml     ---------- LA volume/bsa, ES, 1-p A4C              24.3  ml/m^2 ---------- LA volume, ES, 1-p A2C                  89.5  ml     ---------- LA volume/bsa, ES, 1-p A2C  44.3  ml/m^2 ----------  Mitral valve                            Value        Reference Mitral E-wave peak velocity             64    cm/s   ---------- Mitral A-wave peak velocity             100   cm/s   ---------- Mitral deceleration time        (H)     311   ms     150 - 230 Mitral E/A ratio, peak                  0.6          ----------  Pulmonary arteries                      Value        Reference PA pressure, S, DP                      24    mm Hg  <=30  Tricuspid valve                         Value        Reference Tricuspid regurg peak velocity          229   cm/s   ---------- Tricuspid peak RV-RA gradient           21    mm Hg  ----------  Right atrium                            Value        Reference RA ID, S-I, ES, A4C              (H)     52.6  mm     34 - 49 RA area, ES, A4C                (H)     19.8  cm^2   8.3 - 19.5 RA volume, ES, A/L                      56.6  ml     ---------- RA volume/bsa, ES, A/L                  28    ml/m^2 ----------  Systemic veins                          Value        Reference Estimated CVP                           3     mm Hg  ----------  Right ventricle                         Value        Reference RV ID, minor axis, ED, A4C base         32.4  mm     ---------- TAPSE  27.2  mm     ---------- RV pressure, S, DP                      24    mm Hg  <=30 RV s&', lateral, S                       10.9  cm/s   ----------  Legend: (L)  and  (H)  mark values outside specified reference range.  ------------------------------------------------------------------- Prepared and Electronically Authenticated by  Nona Dell, M.D. 2018-07-29T14:52:00             Risk Assessment/Calculations:   {Does this patient have ATRIAL FIBRILLATION?[phone]}        Lab Results  Component Value Date   WBC 6.2 10/10/2022   HGB 15.2 10/10/2022   HCT 44.9 10/10/2022   MCV 93.4 10/10/2022   PLT 163.0 10/10/2022   Lab Results  Component Value Date   CREATININE 1.00 02/06/2023   BUN 11 02/06/2023   NA 137 02/06/2023   K 5.0 02/06/2023   CL 101 02/06/2023   CO2 30 02/06/2023   Lab Results  Component Value Date   ALT 21 04/18/2022   AST 28 04/18/2022   ALKPHOS 66 04/18/2022   BILITOT 2.6 (H) 04/18/2022   Lab Results  Component Value Date   CHOL 121 04/18/2022   HDL 63.00 04/18/2022   LDLCALC 48 04/18/2022   TRIG 48.0 04/18/2022   CHOLHDL 2 04/18/2022    Lab Results  Component Value Date   HGBA1C 5.7 08/30/2021     Assessment & Plan    1.  Bradycardia: -  2.  Coronary artery disease: -s/p inferior STEMI treated with DES x 1 to occluded RCA.  Today patient reports no chest pain or shortness of breath since previous visit. -Continue  GDMT with ASA 81 mg, Lipitor 80 mg, metoprolol succinate 12.5 mg daily   3.  Essential hypertension: -Patient's blood pressure today was 120/78 -Continue irbesartan 150 mg and Toprol-XL 12.5 mg daily   4.  Hyperlipidemia: -Patient's last LDL cholesterol was 48 -Continue Lipitor 80 mg daily   5.  Thoracic aortic aneurysm: -Patient's last chest MRA 08/2021 with stable ascending aneurysm at 41 mm. -He will have repeat scan in 2 years for surveillance      Disposition: Follow-up with Verne Carrow, MD or APP in *** months {Are you ordering a CV Procedure (e.g. stress test, cath, DCCV, TEE, etc)?   Press F2        :[id number]}   Medication Adjustments/Labs and Tests Ordered: Current medicines are reviewed at length with the patient today.  Concerns regarding medicines are outlined above.   Signed, Napoleon Form, Leodis Rains, NP 02/08/2023, 10:03 AM [facility]

## 2023-02-09 ENCOUNTER — Encounter: Payer: Self-pay | Admitting: Internal Medicine

## 2023-02-09 ENCOUNTER — Ambulatory Visit: Payer: Medicare Other | Admitting: Nurse Practitioner

## 2023-02-09 DIAGNOSIS — R001 Bradycardia, unspecified: Secondary | ICD-10-CM

## 2023-02-09 DIAGNOSIS — I10 Essential (primary) hypertension: Secondary | ICD-10-CM

## 2023-02-09 DIAGNOSIS — E785 Hyperlipidemia, unspecified: Secondary | ICD-10-CM

## 2023-02-09 DIAGNOSIS — I77810 Thoracic aortic ectasia: Secondary | ICD-10-CM

## 2023-02-09 DIAGNOSIS — I2510 Atherosclerotic heart disease of native coronary artery without angina pectoris: Secondary | ICD-10-CM

## 2023-02-10 ENCOUNTER — Other Ambulatory Visit: Payer: Self-pay | Admitting: Internal Medicine

## 2023-02-10 MED ORDER — IRBESARTAN 300 MG PO TABS: 300.0000 mg | ORAL_TABLET | Freq: Every day | ORAL | 1 refills | Status: DC

## 2023-02-14 ENCOUNTER — Encounter: Payer: Self-pay | Admitting: Podiatry

## 2023-02-15 MED ORDER — TERBINAFINE HCL 250 MG PO TABS: 250.0000 mg | ORAL_TABLET | Freq: Every day | ORAL | 0 refills | Status: DC

## 2023-02-21 NOTE — Progress Notes (Unsigned)
Office Visit    Patient Name: Gregory Hale Date of Encounter: 02/21/2023  Primary Care Provider:  Etta Grandchild, MD Primary Cardiologist:  Verne Carrow, MD Primary Electrophysiologist: None   Past Medical History    Past Medical History:  Diagnosis Date   Arthritis    Ascending aorta dilatation (HCC)    Bradycardia    a. HR 40 in setting of acute inferior STEMI 03/2017.   CAD in native artery    a. inf STEMI 03/2017 a/w hypotension and bradycardia -> s/p DES to RCA, otherwise mild disease in LAD, moderate disease in ostium of the moderate caliber diagonal branch, normal LVEF >65%   Dilated aortic root (HCC)    Heart attack (HCC)    STEMI involving right coronary artery (HCC)    04/22/17 PCI/DES to mRCA, normal EF   Tobacco abuse    Quit in 1998   Past Surgical History:  Procedure Laterality Date   CORONARY STENT INTERVENTION N/A 04/22/2017   Procedure: Coronary Stent Intervention;  Surgeon: Kathleene Hazel, MD;  Location: [facility] INVASIVE CV LAB;  Service: Cardiovascular;  Laterality: N/A;   LEFT HEART CATH AND CORONARY ANGIOGRAPHY N/A 04/22/2017   Procedure: Left Heart Cath and Coronary Angiography;  Surgeon: Kathleene Hazel, MD;  Location: [facility] INVASIVE CV LAB;  Service: Cardiovascular;  Laterality: N/A;   parotid Left 1980   TONSILLECTOMY     as a child    Allergies  Allergies  Allergen Reactions   Advil [Ibuprofen] Other (See Comments)    bleeding     History of Present Illness   Gregory Hale is a 72 y.o. male with PMH of CAD s/p acute inferior STEMI treated with DES to occluded RCA, mild disease in the LAD and moderate disease in diagonal branch, ascending aortic dilation (stable at 41 mm), arthritis who presents today for complaint of drug-induced bradycardia.   Mr. Haroldson was initially seen by Dr. Clifton James in 2018 for inferior MI that occurred while playing golf.  While in the ED patient had loss of consciousness and heart rate in the 40s.   BP was documented at 50/20 and patient transferred to the Cath Lab for treatment. LHC showed thrombotic occlusion of RCA treated with DEs, otherwise mild disease in LAD, moderate disease in ostium of the moderate caliber diagonal branch, normal LVEF >65%. 2D Echo showed mild LVH, EF 60-65%, grade 1 DD, mildly dilated aortic root and ascending aorta, mild TR. He was able to be stared on standard post MI therapy including beta blocker and statin.  He was last seen by Dr. Clifton James on 08/2021 for follow-up.  During visit patient was doing well with no reports of chest pain, shortness of breath, palpitations.  He is very active and plays golf 3 to 4 days/week.  Most recent chest MRA 08/2021 showing stable ascending aortic aneurysm at 4.1 cm.  He was seen last in follow-up on 09/20/2022 and reported doing well with no new cardiac complaints.  His blood pressure was noted to be well-controlled at that time.  He was maintaining a busy lifestyle playing golf and compliant with his medications with no changes made to his regimen at that time.   Since last being seen in the office patient reports***.  Patient denies chest pain, palpitations, dyspnea, PND, orthopnea, nausea, vomiting, dizziness, syncope, edema, weight gain, or early satiety.  Home Medications    Current Outpatient Medications  Medication Sig Dispense Refill   acetaminophen (TYLENOL) 500 MG tablet Take 500  mg by mouth every 6 (six) hours as needed.     amLODipine (NORVASC) 5 MG tablet Take 1 tablet (5 mg total) by mouth daily. 90 tablet 0   aspirin EC 81 MG tablet Take 1 tablet (81 mg total) by mouth daily. Swallow whole. 90 tablet 3   atorvastatin (LIPITOR) 80 MG tablet Take 1 tablet (80 mg total) by mouth every evening. 90 tablet 3   celecoxib (CELEBREX) 100 MG capsule Take 1 capsule (100 mg total) by mouth 2 (two) times daily. 180 capsule 0   irbesartan (AVAPRO) 300 MG tablet Take 1 tablet (300 mg total) by mouth daily. 90 tablet 1   metoprolol  succinate (TOPROL-XL) 25 MG 24 hr tablet Take 0.5 tablets (12.5 mg total) by mouth daily. 45 tablet 1   nitroGLYCERIN (NITROSTAT) 0.4 MG SL tablet Place 1 tablet (0.4 mg total) under the tongue every 5 (five) minutes as needed. 25 tablet 3   [START ON 03/27/2023] terbinafine (LAMISIL) 250 MG tablet Take 1 tablet (250 mg total) by mouth daily. 90 tablet 0   No current facility-administered medications for this visit.     Review of Systems  Please see the history of present illness.    (+)*** (+)***  All other systems reviewed and are otherwise negative except as noted above.  Physical Exam    Wt Readings from Last 3 Encounters:  02/06/23 196 lb (88.9 kg)  10/10/22 196 lb (88.9 kg)  09/20/22 194 lb 12.8 oz (88.4 kg)   ZO:XWRUE were no vitals filed for this visit.,There is no height or weight on file to calculate BMI.  Constitutional:      Appearance: Healthy appearance. Not in distress.  Neck:     Vascular: JVD normal.  Pulmonary:     Effort: Pulmonary effort is normal.     Breath sounds: No wheezing. No rales. Diminished in the bases Cardiovascular:     Normal rate. Regular rhythm. Normal S1. Normal S2.      Murmurs: There is no murmur.  Edema:    Peripheral edema absent.  Abdominal:     Palpations: Abdomen is soft non tender. There is no hepatomegaly.  Skin:    General: Skin is warm and dry.  Neurological:     General: No focal deficit present.     Mental Status: Alert and oriented to person, place and time.     Cranial Nerves: Cranial nerves are intact.  EKG/LABS/ Recent Cardiac Studies    ECG personally reviewed by me today - ***  Cardiac Studies & Procedures   CARDIAC CATHETERIZATION  CARDIAC CATHETERIZATION 04/22/2017  Narrative  Mid LAD lesion, 30 %stenosed.  Ost 2nd Diag lesion, 70 %stenosed.  A STENT RESOLUTE ONYX 5.0X18 drug eluting stent was successfully placed.  Prox RCA lesion, 100 %stenosed.  Post intervention, there is a 0% residual stenosis.   The left ventricular systolic function is normal.  LV end diastolic pressure is normal.  The left ventricular ejection fraction is greater than 65% by visual estimate.  There is no mitral valve regurgitation.  1. Acute inferior STEMI secondary to thrombotic occlusion of the mid RCA 2. Successful PTCA/DES x 1 mid RCA 3. Mild disease in the mid LAD 4. Moderate disease in the ostium of the moderate caliber Diagonal branch 5. Normal LV systolic function  Recommendations: Will admit to ICU on telemetry. Will continue Cangrelor for 30 minutes post Brilinta. Will then use ASA, Brilinta for one year. Will start high dose statin and low dose  beta blocker. Echo tomorrow.  Findings Coronary Findings Diagnostic  Dominance: Right  Left Anterior Descending  First Diagonal Branch Vessel is small in size.  First Septal Branch Vessel is small in size.  Second Diagonal Branch  Second Septal Branch Vessel is small in size.  Third Diagonal Branch Vessel is small in size.  Third Septal Branch Vessel is small in size.  Ramus Intermedius Vessel is small.  Left Circumflex Vessel is moderate in size.  First Obtuse Marginal Branch Vessel is moderate in size.  Second Obtuse Marginal Branch Vessel is moderate in size.  Right Coronary Artery  Intervention  Prox RCA lesion Angioplasty Pre-stent angioplasty was performed using a BALLOON EUPHORA RX2.5X12. A STENT RESOLUTE ONYX 5.0X18 drug eluting stent was successfully placed. Stent strut is well apposed. Post-stent angioplasty was performed using a BALLOON [place] EMERGE MR 5.0X12. There is no pre-interventional antegrade distal flow (TIMI 0).  The post-interventional distal flow is normal (TIMI 3). The intervention was successful . No complications occurred at this lesion. There is a 0% residual stenosis post intervention.     ECHOCARDIOGRAM  ECHOCARDIOGRAM COMPLETE 04/23/2017  Narrative *[place]* *[facility]* [address] [place], [place] [address] [phone]  ------------------------------------------------------------------- Echocardiography  Patient:    Ronold, Glomb MR #:       [id number] Study Date: 04/23/2017 Gender:     M Age:        83 Height:     172.7 cm Weight:     83.5 kg BSA:        2.02 m^2 Pt. Status: Room:       2H18C  ADMITTING    Verne Carrow ATTENDING    Clifton James, Christopher ORDERING     McAlhany, Christopher Lauralee Evener SONOGRAPHER  Arvil Chaco PERFORMING   [facility], Inpatient  cc:  ------------------------------------------------------------------- LV EF: 60% -   65%  ------------------------------------------------------------------- Indications:      CAD of native vessels 414.01.  ------------------------------------------------------------------- History:   PMH:  Acute inferior STEMI S/P DES. Smoker in remission. Hyperlipidemia.  ------------------------------------------------------------------- Study Conclusions  - Left ventricle: The cavity size was normal. Wall thickness was increased in a pattern of mild LVH. Systolic function was normal. The estimated ejection fraction was in the range of 60% to 65%. Wall motion was normal; there were no regional wall motion abnormalities. Doppler parameters are consistent with abnormal left ventricular relaxation (grade 1 diastolic dysfunction). - Aortic root: The aortic root was mildly dilated. - Ascending aorta: The ascending aorta was mildly dilated. - Mitral valve: There was trivial regurgitation. - Right atrium: Central venous pressure (est): 3 mm Hg. - Atrial septum: No defect or patent foramen ovale was identified. - Tricuspid valve: There was mild regurgitation. - Pulmonary arteries: PA peak pressure: 24 mm Hg (S). - Pericardium, extracardiac: There was no pericardial effusion.  Impressions:  - Mild LVH with LVEF 60-65% and grade 1 diastolic  dysfunction. Trivial mitral regurgitation. Mildly dilated aortic root and ascending aorta. Mild tricuspid regurgitation with PASP 24 mmHg.  ------------------------------------------------------------------- Labs, prior tests, procedures, and surgery: Catheterization (current admission).    The study demonstrated coronary artery disease. There was a stenosis which was treated with a stent.  ------------------------------------------------------------------- Study data:  No prior study was available for comparison.  Study status:  Routine.  Procedure:  The patient reported no pain pre or post test. Transthoracic echocardiography. Image quality was adequate.  Study completion:  There were no complications. Echocardiography.  M-mode, complete 2D, spectral  Doppler, and color Doppler.  Birthdate:  Patient birthdate: Jan 30, 1951.  Age:  Patient is 72 yr old.  Sex:  Gender: male.    BMI: 28 kg/m^2.  Blood pressure:     113/81  Patient status:  Inpatient.  Study date: Study date: 04/23/2017. Study time: 01:51 PM.  Location:  ICU/CCU  -------------------------------------------------------------------  ------------------------------------------------------------------- Left ventricle:  The cavity size was normal. Wall thickness was increased in a pattern of mild LVH. Systolic function was normal. The estimated ejection fraction was in the range of 60% to 65%. Wall motion was normal; there were no regional wall motion abnormalities. Doppler parameters are consistent with abnormal left ventricular relaxation (grade 1 diastolic dysfunction).  ------------------------------------------------------------------- Aortic valve:   Trileaflet.  Doppler:  There was no significant regurgitation.  ------------------------------------------------------------------- Aorta:  Aortic root: The aortic root was mildly dilated. Ascending aorta: The ascending aorta was mildly  dilated.  ------------------------------------------------------------------- Mitral valve:   The valve appears to be grossly normal.    Doppler: There was trivial regurgitation.  ------------------------------------------------------------------- Left atrium:  The atrium was normal in size.  ------------------------------------------------------------------- Atrial septum:  No defect or patent foramen ovale was identified.  ------------------------------------------------------------------- Right ventricle:  The cavity size was normal. Systolic function was normal.  ------------------------------------------------------------------- Tricuspid valve:   The valve appears to be grossly normal. Doppler:  There was mild regurgitation.  ------------------------------------------------------------------- Right atrium:  The atrium was normal in size.  ------------------------------------------------------------------- Pericardium:  There was no pericardial effusion.  ------------------------------------------------------------------- Systemic veins: Inferior vena cava: The vessel was normal in size. The respirophasic diameter changes were in the normal range (>= 50%), consistent with normal central venous pressure.  ------------------------------------------------------------------- Measurements  Left ventricle                          Value        Reference LV ID, ED, PLAX chordal         (L)     37    mm     43 - 52 LV ID, ES, PLAX chordal         (L)     22.9  mm     23 - 38 LV fx shortening, PLAX chordal          38    %      >=29 LV PW thickness, ED                     11.9  mm     ---------- IVS/LV PW ratio, ED                     1            <=1.3 LV e&', lateral                          8.92  cm/s   ---------- LV E/e&', lateral                        7.17         ---------- LV e&', medial                           5.22  cm/s   ---------- LV E/e&', medial  12.26        ---------- LV e&', average                          7.07  cm/s   ---------- LV E/e&', average                        9.05         ----------  Ventricular septum                      Value        Reference IVS thickness, ED                       11.9  mm     ----------  LVOT                                    Value        Reference LVOT peak velocity, S                   130   cm/s   ---------- LVOT mean velocity, S                   75.3  cm/s   ---------- LVOT VTI, S                             31.9  cm     ---------- LVOT peak gradient, S                   7     mm Hg  ----------  Aorta                                   Value        Reference Aortic root ID, ED                      42    mm     ----------  Left atrium                             Value        Reference LA ID, A-P, ES                          47    mm     ---------- LA ID/bsa, A-P                  (H)     2.33  cm/m^2 <=2.2 LA volume, ES, 1-p A4C                  49.1  ml     ---------- LA volume/bsa, ES, 1-p A4C              24.3  ml/m^2 ---------- LA volume, ES, 1-p A2C                  89.5  ml     ---------- LA volume/bsa, ES, 1-p A2C  44.3  ml/m^2 ----------  Mitral valve                            Value        Reference Mitral E-wave peak velocity             64    cm/s   ---------- Mitral A-wave peak velocity             100   cm/s   ---------- Mitral deceleration time        (H)     311   ms     150 - 230 Mitral E/A ratio, peak                  0.6          ----------  Pulmonary arteries                      Value        Reference PA pressure, S, DP                      24    mm Hg  <=30  Tricuspid valve                         Value        Reference Tricuspid regurg peak velocity          229   cm/s   ---------- Tricuspid peak RV-RA gradient           21    mm Hg  ----------  Right atrium                            Value        Reference RA ID, S-I, ES, A4C              (H)     52.6  mm     34 - 49 RA area, ES, A4C                (H)     19.8  cm^2   8.3 - 19.5 RA volume, ES, A/L                      56.6  ml     ---------- RA volume/bsa, ES, A/L                  28    ml/m^2 ----------  Systemic veins                          Value        Reference Estimated CVP                           3     mm Hg  ----------  Right ventricle                         Value        Reference RV ID, minor axis, ED, A4C base         32.4  mm     ---------- TAPSE  27.2  mm     ---------- RV pressure, S, DP                      24    mm Hg  <=30 RV s&', lateral, S                       10.9  cm/s   ----------  Legend: (L)  and  (H)  mark values outside specified reference range.  ------------------------------------------------------------------- Prepared and Electronically Authenticated by  Nona Dell, M.D. 2018-07-29T14:52:00             Risk Assessment/Calculations:   {Does this patient have ATRIAL FIBRILLATION?:[id number]}        Lab Results  Component Value Date   WBC 6.2 10/10/2022   HGB 15.2 10/10/2022   HCT 44.9 10/10/2022   MCV 93.4 10/10/2022   PLT 163.0 10/10/2022   Lab Results  Component Value Date   CREATININE 1.00 02/06/2023   BUN 11 02/06/2023   NA 137 02/06/2023   K 5.0 02/06/2023   CL 101 02/06/2023   CO2 30 02/06/2023   Lab Results  Component Value Date   ALT 21 04/18/2022   AST 28 04/18/2022   ALKPHOS 66 04/18/2022   BILITOT 2.6 (H) 04/18/2022   Lab Results  Component Value Date   CHOL 121 04/18/2022   HDL 63.00 04/18/2022   LDLCALC 48 04/18/2022   TRIG 48.0 04/18/2022   CHOLHDL 2 04/18/2022    Lab Results  Component Value Date   HGBA1C 5.7 08/30/2021     Assessment & Plan    1.  Bradycardia: -   2.  Coronary artery disease: -s/p inferior STEMI treated with DES x 1 to occluded RCA.  Today patient reports no chest pain or shortness of breath since previous visit. -Continue  GDMT with ASA 81 mg, Lipitor 80 mg, metoprolol succinate 12.5 mg daily   3.  Essential hypertension: -Patient's blood pressure today was 120/78 -Continue irbesartan 150 mg and Toprol-XL 12.5 mg daily   4.  Hyperlipidemia: -Patient's last LDL cholesterol was 48 -Continue Lipitor 80 mg daily   5.  Thoracic aortic aneurysm: -Patient's last chest MRA 08/2021 with stable ascending aneurysm at 41 mm. -He will have repeat scan in 2 years for surveillance      Disposition: Follow-up with Verne Carrow, MD or APP in *** months {Are you ordering a CV Procedure (e.g. stress test, cath, DCCV, TEE, etc)?   Press F2        :[id number]}   Medication Adjustments/Labs and Tests Ordered: Current medicines are reviewed at length with the patient today.  Concerns regarding medicines are outlined above.   Signed, Napoleon Form, Leodis Rains, NP 02/21/2023, 3:28 PM [facility]

## 2023-02-23 ENCOUNTER — Ambulatory Visit: Payer: Medicare Other | Attending: Nurse Practitioner | Admitting: Nurse Practitioner

## 2023-02-23 ENCOUNTER — Encounter: Payer: Self-pay | Admitting: Nurse Practitioner

## 2023-02-23 ENCOUNTER — Ambulatory Visit (INDEPENDENT_AMBULATORY_CARE_PROVIDER_SITE_OTHER): Payer: Medicare Other

## 2023-02-23 VITALS — BP 124/70 | HR 80 | Ht 68.0 in | Wt 191.8 lb

## 2023-02-23 DIAGNOSIS — I2510 Atherosclerotic heart disease of native coronary artery without angina pectoris: Secondary | ICD-10-CM | POA: Insufficient documentation

## 2023-02-23 DIAGNOSIS — E785 Hyperlipidemia, unspecified: Secondary | ICD-10-CM | POA: Diagnosis not present

## 2023-02-23 DIAGNOSIS — I10 Essential (primary) hypertension: Secondary | ICD-10-CM | POA: Insufficient documentation

## 2023-02-23 DIAGNOSIS — I77810 Thoracic aortic ectasia: Secondary | ICD-10-CM | POA: Insufficient documentation

## 2023-02-23 DIAGNOSIS — R001 Bradycardia, unspecified: Secondary | ICD-10-CM | POA: Diagnosis not present

## 2023-02-23 DIAGNOSIS — T50905A Adverse effect of unspecified drugs, medicaments and biological substances, initial encounter: Secondary | ICD-10-CM | POA: Insufficient documentation

## 2023-02-23 NOTE — Progress Notes (Unsigned)
ZIO XT serial # [id number] from office inventory applied in office.  Dr. Clifton James to read.

## 2023-02-23 NOTE — Patient Instructions (Addendum)
Medication Instructions:  Your physician recommends that you continue on your current medications as directed. Please refer to the Current Medication list given to you today. *If you need a refill on your cardiac medications before your next appointment, please call your pharmacy*   Lab Work: None ordered   Testing/Procedures: ZIO XT- Long Term Monitor Instructions  Your physician has requested you wear a ZIO patch monitor for 14 days.  This is a single patch monitor. [employer] supplies one patch monitor per enrollment. Additional stickers are not available. Please do not apply patch if you will be having a Nuclear Stress Test,  Echocardiogram, Cardiac CT, MRI, or Chest Xray during the period you would be wearing the  monitor. The patch cannot be worn during these tests. You cannot remove and re-apply the  ZIO XT patch monitor.  Your ZIO patch monitor will be mailed 3 day USPS to your address on file. It may take 3-5 days  to receive your monitor after you have been enrolled.  Once you have received your monitor, please review the enclosed instructions. Your monitor  has already been registered assigning a specific monitor serial # to you.  Billing and Patient Assistance Program Information  We have supplied [employer] with any of your insurance information on file for billing purposes. [employer] offers a sliding scale Patient Assistance Program for patients that do not have  insurance, or whose insurance does not completely cover the cost of the ZIO monitor.  You must apply for the Patient Assistance Program to qualify for this discounted rate.  To apply, please call [employer] at [phone], select option 4, select option 2, ask to apply for  Patient Assistance Program. Meredeth Ide will ask your household income, and how many people  are in your household. They will quote your out-of-pocket cost based on that information.  [employer] will also be able to set up a 67-month, interest-free payment  plan if needed.  Applying the monitor   Shave hair from upper left chest.  Hold abrader disc by orange tab. Rub abrader in 40 strokes over the upper left chest as  indicated in your monitor instructions.  Clean area with 4 enclosed alcohol pads. Let dry.  Apply patch as indicated in monitor instructions. Patch will be placed under collarbone on left  side of chest with arrow pointing upward.  Rub patch adhesive wings for 2 minutes. Remove white label marked "1". Remove the white  label marked "2". Rub patch adhesive wings for 2 additional minutes.  While looking in a mirror, press and release button in center of patch. A small green light will  flash 3-4 times. This will be your only indicator that the monitor has been turned on.  Do not shower for the first 24 hours. You may shower after the first 24 hours.  Press the button if you feel a symptom. You will hear a small click. Record Date, Time and  Symptom in the Patient Logbook.  When you are ready to remove the patch, follow instructions on the last 2 pages of Patient  Logbook. Stick patch monitor onto the last page of Patient Logbook.  Place Patient Logbook in the blue and white box. Use locking tab on box and tape box closed  securely. The blue and white box has prepaid postage on it. Please place it in the mailbox as  soon as possible. Your physician should have your test results approximately 7 days after the  monitor has been mailed back to [facility].  Call [facility] Customer Care at [phone] if you have questions regarding  your ZIO XT patch monitor. Call them immediately if you see an orange light blinking on your  monitor.  If your monitor falls off in less than 4 days, contact our Monitor department at [phone].  If your monitor becomes loose or falls off after 4 days call [employer] at [phone] for  suggestions on securing your monitor    Follow-Up: At [facility], you and your health  needs are our priority.  As part of our continuing mission to provide you with exceptional heart care, we have created designated Provider Care Teams.  These Care Teams include your primary Cardiologist (physician) and Advanced Practice Providers (APPs -  Physician Assistants and Nurse Practitioners) who all work together to provide you with the care you need, when you need it.  We recommend signing up for the patient portal called "MyChart".  Sign up information is provided on this After Visit Summary.  MyChart is used to connect with patients for Virtual Visits (Telemedicine).  Patients are able to view lab/test results, encounter notes, upcoming appointments, etc.  Non-urgent messages can be sent to your provider as well.   To learn more about what you can do with MyChart, go to [email or web address].    Your next appointment:   2 month(s)  Provider:   Robin Searing, NP   Then, Verne Carrow, MD will plan to see you again in 5 month(s).(December 2024)  Other Instructions

## 2023-02-27 ENCOUNTER — Telehealth: Payer: Self-pay | Admitting: Cardiovascular Disease

## 2023-02-27 NOTE — Telephone Encounter (Signed)
[employer] contacted to cancel charges on first monitor.  Patient instructed to mail monitor back for processing.  [employer] to assign a new monitor serial # to patient and ship UPS to his home.

## 2023-02-27 NOTE — Telephone Encounter (Signed)
Patient is requesting call back to discuss getting another monitor since the first monitor was only worn for 3 days. Please advise.

## 2023-02-27 NOTE — Telephone Encounter (Signed)
Patient is following up, requesting a call regarding his heart monitor that fell off.

## 2023-03-02 ENCOUNTER — Encounter: Payer: Self-pay | Admitting: Podiatry

## 2023-03-03 ENCOUNTER — Encounter: Payer: Self-pay | Admitting: Internal Medicine

## 2023-03-03 ENCOUNTER — Other Ambulatory Visit: Payer: Self-pay | Admitting: Internal Medicine

## 2023-03-03 DIAGNOSIS — M545 Low back pain, unspecified: Secondary | ICD-10-CM

## 2023-03-03 DIAGNOSIS — M159 Polyosteoarthritis, unspecified: Secondary | ICD-10-CM

## 2023-03-03 MED ORDER — CELECOXIB 100 MG PO CAPS
100.0000 mg | ORAL_CAPSULE | Freq: Two times a day (BID) | ORAL | 0 refills | Status: DC
Start: 2023-03-03 — End: 2023-06-05

## 2023-03-06 ENCOUNTER — Other Ambulatory Visit: Payer: Self-pay | Admitting: Internal Medicine

## 2023-03-06 DIAGNOSIS — H5203 Hypermetropia, bilateral: Secondary | ICD-10-CM | POA: Diagnosis not present

## 2023-03-06 DIAGNOSIS — H40023 Open angle with borderline findings, high risk, bilateral: Secondary | ICD-10-CM | POA: Diagnosis not present

## 2023-03-06 DIAGNOSIS — H353131 Nonexudative age-related macular degeneration, bilateral, early dry stage: Secondary | ICD-10-CM | POA: Diagnosis not present

## 2023-03-06 DIAGNOSIS — H2513 Age-related nuclear cataract, bilateral: Secondary | ICD-10-CM | POA: Diagnosis not present

## 2023-03-06 DIAGNOSIS — H524 Presbyopia: Secondary | ICD-10-CM | POA: Diagnosis not present

## 2023-03-06 DIAGNOSIS — H52223 Regular astigmatism, bilateral: Secondary | ICD-10-CM | POA: Diagnosis not present

## 2023-03-06 DIAGNOSIS — H43811 Vitreous degeneration, right eye: Secondary | ICD-10-CM | POA: Diagnosis not present

## 2023-03-07 ENCOUNTER — Encounter: Payer: Self-pay | Admitting: Podiatry

## 2023-03-07 ENCOUNTER — Ambulatory Visit (INDEPENDENT_AMBULATORY_CARE_PROVIDER_SITE_OTHER): Payer: Medicare Other | Admitting: Podiatry

## 2023-03-07 DIAGNOSIS — B351 Tinea unguium: Secondary | ICD-10-CM

## 2023-03-07 DIAGNOSIS — M2041 Other hammer toe(s) (acquired), right foot: Secondary | ICD-10-CM

## 2023-03-08 ENCOUNTER — Encounter: Payer: Self-pay | Admitting: Podiatry

## 2023-03-08 NOTE — Progress Notes (Signed)
  Subjective:  Patient ID: Gregory Hale, male    DOB: 08/09/1951,  MRN: [id number]  Chief Complaint  Patient presents with   Toe Pain    "It's a follow-up on my toe."   Nail Problem    "Follow-up on my toenails."    72 y.o. male presents with the above complaint. History confirmed with patient.  He returns for follow-up, has not noticed much of a difference in the nails yet although the do slightly feel better, he has had more discomfort in the top of the toe that has been rubbing  Objective:  Physical Exam: warm, good capillary refill, no trophic changes or ulcerative lesions, normal DP and PT pulses, normal sensory exam, onychomycosis, and bilaterally has hallux valgus deformity and hammertoe contractures, worse on the right than left, preulcerative callus on the dorsal PIPJ.     Assessment:   1. Hammertoe of second toe of right foot   2. Onychomycosis      Plan:  Patient was evaluated and treated and all questions answered.  We again discussed the painful hammertoe.  Silicone padding has not been helpful.  He inquired about a toe regulator, this was dispensed today as well and I showed him how to use it.  If this is unsuccessful then the only remaining option would be surgical treatment we will take radiographs to assess this at his next visit if he is interested.  Continue remaining Lamisil, he has a refill waiting for him advised him to take a 2 to 3-week break from the medication between courses, I will see him back in 2 months to reevaluate.  Return in about 2 months (around 05/07/2023) for f/u nail fungus and hammertoe.

## 2023-03-13 ENCOUNTER — Ambulatory Visit: Payer: Medicare Other | Admitting: Internal Medicine

## 2023-03-16 ENCOUNTER — Ambulatory Visit (INDEPENDENT_AMBULATORY_CARE_PROVIDER_SITE_OTHER): Payer: Medicare Other

## 2023-03-16 ENCOUNTER — Ambulatory Visit (INDEPENDENT_AMBULATORY_CARE_PROVIDER_SITE_OTHER): Payer: Medicare Other | Admitting: Family Medicine

## 2023-03-16 ENCOUNTER — Encounter: Payer: Self-pay | Admitting: Family Medicine

## 2023-03-16 VITALS — BP 138/86 | HR 55 | Temp 97.4°F | Ht 68.0 in | Wt 187.0 lb

## 2023-03-16 DIAGNOSIS — R1013 Epigastric pain: Secondary | ICD-10-CM

## 2023-03-16 DIAGNOSIS — K5900 Constipation, unspecified: Secondary | ICD-10-CM | POA: Diagnosis not present

## 2023-03-16 DIAGNOSIS — R630 Anorexia: Secondary | ICD-10-CM

## 2023-03-16 LAB — BASIC METABOLIC PANEL
BUN: 19 mg/dL (ref 6–23)
CO2: 28 mEq/L (ref 19–32)
Calcium: 9.2 mg/dL (ref 8.4–10.5)
Chloride: 103 mEq/L (ref 96–112)
Creatinine, Ser: 1 mg/dL (ref 0.40–1.50)
GFR: 76.05 mL/min (ref >60.00)
Glucose, Bld: 110 mg/dL — ABNORMAL HIGH (ref 70–99)
Potassium: 5.1 mEq/L (ref 3.5–5.1)
Sodium: 138 mEq/L (ref 135–145)

## 2023-03-16 LAB — CBC WITH DIFFERENTIAL/PLATELET
Basophils Absolute: 0 10*3/uL (ref 0.0–0.1)
Basophils Relative: 0.5 % (ref 0.0–3.0)
Eosinophils Absolute: 0.1 10*3/uL (ref 0.0–0.7)
Eosinophils Relative: 0.9 % (ref 0.0–5.0)
HCT: 44.6 % (ref 39.0–52.0)
Hemoglobin: 14.6 g/dL (ref 13.0–17.0)
Lymphocytes Relative: 23 % (ref 12.0–46.0)
Lymphs Abs: 1.7 10*3/uL (ref 0.7–4.0)
MCHC: 32.8 g/dL (ref 30.0–36.0)
MCV: 95.3 fl (ref 78.0–100.0)
Monocytes Absolute: 0.7 10*3/uL (ref 0.1–1.0)
Monocytes Relative: 9.6 % (ref 3.0–12.0)
Neutro Abs: 4.8 10*3/uL (ref 1.4–7.7)
Neutrophils Relative %: 66 % (ref 43.0–77.0)
Platelets: 176 10*3/uL (ref 150.0–400.0)
RBC: 4.68 Mil/uL (ref 4.22–5.81)
RDW: 14.3 % (ref 11.5–15.5)
WBC: 7.3 10*3/uL (ref 4.0–10.5)

## 2023-03-16 LAB — LIPASE: Lipase: 62 U/L — ABNORMAL HIGH (ref 11.0–59.0)

## 2023-03-16 LAB — HEPATIC FUNCTION PANEL
ALT: 15 U/L (ref 0–53)
AST: 20 U/L (ref 0–37)
Albumin: 4.3 g/dL (ref 3.5–5.2)
Alkaline Phosphatase: 73 U/L (ref 39–117)
Bilirubin, Direct: 0.3 mg/dL (ref 0.0–0.3)
Total Bilirubin: 1.3 mg/dL — ABNORMAL HIGH (ref 0.2–1.2)
Total Protein: 7.2 g/dL (ref 6.0–8.3)

## 2023-03-16 NOTE — Progress Notes (Signed)
Subjective:     Patient ID: Gregory Hale, male    DOB: Aug 28, 1951, 72 y.o.   MRN: [id number]  Chief Complaint  Patient presents with   Abdominal Pain    On and off abdominal pain that comes and goes above belly button. Constipated. Has been going on for about 2 weeks    Abdominal Pain Associated symptoms include constipation. Pertinent negatives include no diarrhea, dysuria, fever, frequency, nausea or vomiting.    Discussed the use of AI scribe software for clinical note transcription with the patient, who gave verbal consent to proceed.  History of Present Illness          C/o intermittent mid epigastric pain x 2 weeks. Occurs 2-3 times per day, lasts 10 mins to 1 hour.   Feels like acid indigestion. Decreased appetite. Tums and milk seems to help.   He takes Celebrex bid for arthritis. No blood in stool.   Constipated. Last bowel movement 4 days ago.   Normally he has daily bowel movements.   He is eating less.  Quite active.  Played golf yesterday.  Currently up-to-date with colonoscopy.  History of colonic polyps.    Health Maintenance Due  Topic Date Due   Medicare Annual Wellness (AWV)  Never done    Past Medical History:  Diagnosis Date   Arthritis    Ascending aorta dilatation (HCC)    Bradycardia    a. HR 40 in setting of acute inferior STEMI 03/2017.   CAD in native artery    a. inf STEMI 03/2017 a/w hypotension and bradycardia -> s/p DES to RCA, otherwise mild disease in LAD, moderate disease in ostium of the moderate caliber diagonal branch, normal LVEF >65%   Dilated aortic root (HCC)    Heart attack (HCC)    STEMI involving right coronary artery (HCC)    04/22/17 PCI/DES to mRCA, normal EF   Tobacco abuse    Quit in 1998    Past Surgical History:  Procedure Laterality Date   CORONARY STENT INTERVENTION N/A 04/22/2017   Procedure: Coronary Stent Intervention;  Surgeon: Kathleene Hazel, MD;  Location: [facility] INVASIVE CV LAB;  Service:  Cardiovascular;  Laterality: N/A;   LEFT HEART CATH AND CORONARY ANGIOGRAPHY N/A 04/22/2017   Procedure: Left Heart Cath and Coronary Angiography;  Surgeon: Kathleene Hazel, MD;  Location: [facility] INVASIVE CV LAB;  Service: Cardiovascular;  Laterality: N/A;   parotid Left 1980   TONSILLECTOMY     as a child    Family History  Problem Relation Age of Onset   CAD Brother    Diabetes Brother    Rheumatic fever Father    Other Father        rheumatic heart   Pancreatic cancer Sister    Colon cancer Neg Hx    Esophageal cancer Neg Hx    Stomach cancer Neg Hx    Rectal cancer Neg Hx     Social History   Socioeconomic History   Marital status: Married    Spouse name: Not on file   Number of children: 1   Years of education: Not on file   Highest education level: Bachelor's degree (e.g., BA, AB, BS)  Occupational History   Occupation: retired  Tobacco Use   Smoking status: Former    Types: Cigarettes    Quit date: 1990    Years since quitting: 34.4   Smokeless tobacco: Never  Vaping Use   Vaping Use: Never used  Substance and Sexual  Activity   Alcohol use: Yes    Alcohol/week: 10.0 standard drinks of alcohol    Types: 10 Cans of beer per week    Comment: .5 per day   Drug use: No   Sexual activity: Yes    Partners: Female  Other Topics Concern   Not on file  Social History Narrative   Not on file   Social Determinants of Health   Financial Resource Strain: Low Risk  (02/05/2023)   Overall Financial Resource Strain (CARDIA)    Difficulty of Paying Living Expenses: Not hard at all  Food Insecurity: No Food Insecurity (02/05/2023)   Hunger Vital Sign    Worried About Running Out of Food in the Last Year: Never true    Ran Out of Food in the Last Year: Never true  Transportation Needs: No Transportation Needs (02/05/2023)   PRAPARE - Administrator, Civil Service (Medical): No    Lack of Transportation (Non-Medical): No  Physical Activity: Sufficiently  Active (02/05/2023)   Exercise Vital Sign    Days of Exercise per Week: 6 days    Minutes of Exercise per Session: 150+ min  Stress: No Stress Concern Present (02/05/2023)   [employer] of Occupational Health - Occupational Stress Questionnaire    Feeling of Stress : Only a little  Social Connections: Socially Integrated (02/05/2023)   Social Connection and Isolation Panel [NHANES]    Frequency of Communication with Friends and Family: More than three times a week    Frequency of Social Gatherings with Friends and Family: More than three times a week    Attends Religious Services: 1 to 4 times per year    Active Member of [employer] or Organizations: Yes    Attends Engineer, structural: More than 4 times per year    Marital Status: Married  Catering manager Violence: Not on file    Outpatient Medications Prior to Visit  Medication Sig Dispense Refill   acetaminophen (TYLENOL) 500 MG tablet Take 500 mg by mouth every 6 (six) hours as needed.     amLODipine (NORVASC) 5 MG tablet Take 1 tablet (5 mg total) by mouth daily. 90 tablet 0   aspirin EC 81 MG tablet Take 1 tablet (81 mg total) by mouth daily. Swallow whole. 90 tablet 3   atorvastatin (LIPITOR) 80 MG tablet Take 1 tablet (80 mg total) by mouth every evening. 90 tablet 3   celecoxib (CELEBREX) 100 MG capsule Take 1 capsule (100 mg total) by mouth 2 (two) times daily. 180 capsule 0   irbesartan (AVAPRO) 300 MG tablet Take 1 tablet (300 mg total) by mouth daily. 90 tablet 1   metoprolol succinate (TOPROL-XL) 25 MG 24 hr tablet Take 0.5 tablets (12.5 mg total) by mouth daily. 45 tablet 1   nitroGLYCERIN (NITROSTAT) 0.4 MG SL tablet Place 1 tablet (0.4 mg total) under the tongue every 5 (five) minutes as needed. 25 tablet 3   [START ON 03/27/2023] terbinafine (LAMISIL) 250 MG tablet Take 1 tablet (250 mg total) by mouth daily. (Patient not taking: Reported on 03/16/2023) 90 tablet 0   No facility-administered medications prior to  visit.    Allergies  Allergen Reactions   Advil [Ibuprofen] Other (See Comments)    bleeding    Review of Systems  Constitutional:  Negative for chills, fever and malaise/fatigue.  Respiratory:  Negative for shortness of breath.   Cardiovascular:  Negative for chest pain, palpitations and leg swelling.  Gastrointestinal:  Positive for  abdominal pain and constipation. Negative for diarrhea, nausea and vomiting.  Genitourinary:  Negative for dysuria, frequency and urgency.  Neurological:  Negative for dizziness and focal weakness.       Objective:    Physical Exam Constitutional:      General: He is not in acute distress.    Appearance: He is not ill-appearing.  Eyes:     Extraocular Movements: Extraocular movements intact.     Conjunctiva/sclera: Conjunctivae normal.  Cardiovascular:     Rate and Rhythm: Normal rate and regular rhythm.  Pulmonary:     Effort: Pulmonary effort is normal.     Breath sounds: Normal breath sounds.  Abdominal:     General: Abdomen is flat. Bowel sounds are increased. There is no distension.     Palpations: Abdomen is soft. There is no pulsatile mass.     Tenderness: There is no abdominal tenderness. There is no right CVA tenderness, left CVA tenderness, guarding or rebound. Negative signs include Murphy's sign and McBurney's sign.  Musculoskeletal:     Cervical back: Normal range of motion and neck supple.  Skin:    General: Skin is warm and dry.  Neurological:     General: No focal deficit present.     Mental Status: He is alert and oriented to person, place, and time.  Psychiatric:        Mood and Affect: Mood normal.        Behavior: Behavior normal.        Thought Content: Thought content normal.      BP 138/86 (BP Location: Left Arm, Patient Position: Sitting, Cuff Size: Large)   Pulse (!) 55   Temp (!) 97.4 F (36.3 C) (Temporal)   Ht 5\' 8"  (1.727 m)   Wt 187 lb (84.8 kg)   SpO2 96%   BMI 28.43 kg/m  Wt Readings from Last  3 Encounters:  03/16/23 187 lb (84.8 kg)  02/23/23 191 lb 12.8 oz (87 kg)  02/06/23 196 lb (88.9 kg)       Assessment & Plan:   Problem List Items Addressed This Visit   None Visit Diagnoses     Epigastric pain    -  Primary   Relevant Orders   CBC with Differential/Platelet (Completed)   Basic metabolic panel (Completed)   Hepatic function panel (Completed)   Lipase (Completed)   DG Abd 2 Views (Completed)   Constipation, unspecified constipation type       Relevant Orders   DG Abd 2 Views (Completed)   Decreased appetite          No red flag symptoms.  Abdominal exam is benign. Reviewed previous colonoscopy with Dr. Russella Dar.  He does have history of colonic polyps.  Due for colonoscopy recall in November 2024. Abdominal x-ray negative for obstruction or large stool burden.  Labs are unremarkable, no sign of pancreatitis or acute infectious process. Recommend stopping Celebrex and starting Pepcid twice daily.  Recommend starting MiraLAX and titrating to improve bowel movements. Follow-up if any new or worsening symptoms.  I am having Harlan Stains maintain his aspirin EC, acetaminophen, nitroGLYCERIN, atorvastatin, metoprolol succinate, amLODipine, irbesartan, terbinafine, and celecoxib.  No orders of the defined types were placed in this encounter.

## 2023-03-16 NOTE — Patient Instructions (Addendum)
Please go downstairs for labs and an x-ray of your abdomen before you leave.  Try over-the-counter MiraLAX 1-2 capfuls daily in 6 to 8 ounces of fluids. You can titrate the Miralax to to help keep your stools more regular.  Eat a diet with plenty of fiber.   Stay well-hydrated.  Cut back or stop Celebrex.  Try taking over-the-counter Pepcid twice daily.  We will be in touch with your results.  Let us know if you have any new or worsening symptoms such as fever, chills, vomiting, blood in your stool or if you are unable to pass stool or gas through your rectum.  You are due for your colonoscopy with Dr. Russella Dar in November. If you are not improving in the next week or two, you may need to see your GI.

## 2023-03-17 NOTE — Telephone Encounter (Signed)
Pt has question regarding which dosage for Pepcid

## 2023-03-21 ENCOUNTER — Telehealth: Payer: Self-pay | Admitting: Gastroenterology

## 2023-03-21 NOTE — Telephone Encounter (Signed)
Inbound call from patient requesting to make an appointment. Patient was scheduled for 9/3 at 9:00 with Hyacinth Meeker and is requesting a call from the nurse to discuss symptoms. He believes he can not wait until September. Please advise.

## 2023-03-21 NOTE — Telephone Encounter (Signed)
The pt has epigastric pain that he has seen his PCP for and is being treated.  He states he is feeling much better but wanted to see if there was a sooner appt.  I did advise that if he gets worse to reach back out to his PCP or call our office to see if we have had any cancellations.  The pt has been advised of the information and verbalized understanding.

## 2023-03-23 ENCOUNTER — Telehealth: Payer: Self-pay | Admitting: Cardiovascular Disease

## 2023-03-23 DIAGNOSIS — I442 Atrioventricular block, complete: Secondary | ICD-10-CM

## 2023-03-23 DIAGNOSIS — R001 Bradycardia, unspecified: Secondary | ICD-10-CM | POA: Diagnosis not present

## 2023-03-23 DIAGNOSIS — I2510 Atherosclerotic heart disease of native coronary artery without angina pectoris: Secondary | ICD-10-CM | POA: Diagnosis not present

## 2023-03-23 NOTE — Telephone Encounter (Signed)
Spoke w Gregory Hale at [facility] reporting end of summary results for monitor 2. (see study - pt wore one monitor for 3 days and a seond monitor for almost 6 days)  Results for monitor 2 show High Grade, 3rd degree AV block--4 episodes on this report - slowest HR 25 bpm at 4:47 am on 03/05/23.   Reviewed by Dr. Tenny Craw - who reviewed monitor 1 also - episodes there as well and was also early am.  She did not want to stop Toprol XL12.5 mg.    He was having  SVT epioses as well Ocassional 1.9%. - Max HR 167  Both reports are available in EPIC for Dr. Clifton James to review.  This monitor was ordered by APP for bradycardia.

## 2023-03-23 NOTE — Telephone Encounter (Signed)
Tramine with [employer] is calling to report abnormal Zio results.  Phone#: [phone] (QMV#:[phone])

## 2023-03-24 ENCOUNTER — Telehealth: Payer: Self-pay | Admitting: Cardiovascular Disease

## 2023-03-24 NOTE — Telephone Encounter (Signed)
Left message for patient to call back  

## 2023-03-24 NOTE — Telephone Encounter (Signed)
Pt would like a c/b to go over monitor results.

## 2023-03-24 NOTE — Telephone Encounter (Signed)
Called and spoke with patient and discussed heart monitor results. Patient is concerned about what heart monitor showed and states he was planning to play golf this weekend.   Patient verbalized understanding of discontinuing metoprolol and tapering instructions from Robin Searing, NP.  Informed patient it appears from another phone note Dr. Clifton James noted that heart block was occurring at night during sleep. Informed patient he may play golf, but to stay hydrated and takes breaks especially if he feels SOB, fatigue, dizziness, or CP.  Patient denies any of the above symptoms. Advised if he develops any of the above symptoms between now and appt with Dr. Lalla Brothers on 7/3 to go to ED.  Patient verbalized understanding and ended call.

## 2023-03-24 NOTE — Telephone Encounter (Signed)
Gregory Hazel, MD  Lendon Ka, RN; [facility] Triage6 hours ago (10:09 AM)    He had high grade heart block while sleeping. Can we make sure he is feeling ok and not having any dizziness? I would like to refer him to EP. Can someone make that referral to EP? Continue Toprol for now. Chris    Patient called back. Informed patient of Dr. Gibson Ramp advisement. Placed referral for EP.

## 2023-03-29 ENCOUNTER — Ambulatory Visit: Payer: Medicare Other | Attending: Cardiology | Admitting: Cardiology

## 2023-03-29 ENCOUNTER — Encounter: Payer: Self-pay | Admitting: Cardiology

## 2023-03-29 VITALS — BP 118/76 | HR 71 | Ht 69.0 in | Wt 181.0 lb

## 2023-03-29 DIAGNOSIS — I442 Atrioventricular block, complete: Secondary | ICD-10-CM

## 2023-03-29 DIAGNOSIS — I495 Sick sinus syndrome: Secondary | ICD-10-CM

## 2023-03-29 MED ORDER — METOPROLOL SUCCINATE ER 25 MG PO TB24: 12.5000 mg | ORAL_TABLET | Freq: Every day | ORAL | 3 refills | Status: DC

## 2023-03-29 NOTE — Progress Notes (Signed)
Electrophysiology Office Note:    Date:  03/29/2023   ID:  Gregory Hale, DOB 20-Feb-1951, MRN [id number]  [facility] Cardiologist:  Verne Carrow, MD  [facility] Electrophysiologist:  Lanier Prude, MD   Referring MD: Kathleene Hazel*   Chief Complaint: Bradycardia  History of Present Illness:    Gregory Hale is a 72 y.o. malewho I am seeing today for an evaluation of bradycardia at the request of Dr. Clifton James and Robin Searing, NP.  The patient was last seen by Alden Server on Feb 23, 2023.  The patient has a medical history that includes coronary artery disease, STEMI in 2018, dilated aortic root, tobacco abuse (remote).  The patient reported a history of bradycardia while at his appointment with Alden Server.  At that visit his heart rate was 80 bpm.  No recent syncope.  A heart monitor was ordered.  The patient is very active.  He golfs frequently and walks 18 holes without limitation.  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  No syncope or presyncope.  His metoprolol succinate has been reduced.  He tells me that he was previously diagnosed with sleep apnea but did not start treatment with CPAP due to issues with the facemask.  This was "years ago".  His wife does tell him that he snores loudly and has pauses in his breathing at night.        Their past medical, social and family history was reveiwed.   ROS:   Please see the history of present illness.    All other systems reviewed and are negative.  EKGs/Labs/Other Studies Reviewed:    The following studies were reviewed today:  March 24, 2023 ZIO monitor personally reviewed Heart rate 23-1 67, average 59 beats per minute. 6 episodes of high degree AV block Wenckebach was present Bradycardic episodes were at night or early morning  Feb 06, 2023 EKG shows sinus bradycardia with a ventricular rate of 46 bpm        Physical Exam:    VS:  BP 118/76   Pulse 71   Ht 5\' 9"   (1.753 m)   Wt 181 lb (82.1 kg)   SpO2 98%   BMI 26.73 kg/m     Wt Readings from Last 3 Encounters:  03/29/23 181 lb (82.1 kg)  03/16/23 187 lb (84.8 kg)  02/23/23 191 lb 12.8 oz (87 kg)     GEN:  Well nourished, well developed in no acute distress.  Appears younger than stated age CARDIAC: RRR, no murmurs, rubs, gallops RESPIRATORY:  Clear to auscultation without rales, wheezing or rhonchi       ASSESSMENT AND PLAN:    1. Heart block AV complete (HCC)   2. Tachycardia-bradycardia syndrome (HCC)     #Tachybradycardia #Nocturnal bradycardia/high degree AV block The patient has evidence of tachycardia-bradycardia syndrome with nocturnal bradycardia and AV block.  Recent ZIO monitor does show good chronotropic competence.  He is asymptomatic.  Based on his history of sleep apnea and reported history of heavy snoring and nighttime apnea, I suspect his nocturnal bradycardia and AV block is secondary to hyper vagotonia.  We discussed treatment options including continuing with a watchful waiting approach versus proceeding with permanent pacing.  Given his chronotropic response, the patient and I mutually decided to continue with watchful waiting approach.  He will continue low-dose metoprolol 6.25 mg by mouth once daily.  He will let us know if he develops any worsening of symptoms including lightheadedness, dizziness  or syncope.  #Probable obstructive sleep apnea Based on history alone I suspect he has significant sleep apnea.  We had an extensive conversation today about the length between vagal tone, sleep apnea and nocturnal bradycardia.  Unguinal order him a repeat home sleep study to better assess.  #Hypertension At goal today.  Recommend checking blood pressures 1-2 times per week at home and recording the values.  Recommend bringing these recordings to the primary care physician.   Follow-up 6 months with me.   Signed, Rossie Muskrat. Lalla Brothers, MD, [facility], [facility] 03/29/2023 8:49 PM     Electrophysiology [facility]

## 2023-03-29 NOTE — Patient Instructions (Addendum)
Medication Instructions:  Your physician recommends that you continue on your current medications as directed. Please refer to the Current Medication list given to you today.  *If you need a refill on your cardiac medications before your next appointment, please call your pharmacy*  Testing/Procedures: Your physician has recommended that you have a sleep study. This test records several body functions during sleep, including: brain activity, eye movement, oxygen and carbon dioxide blood levels, heart rate and rhythm, breathing rate and rhythm, the flow of air through your mouth and nose, snoring, body muscle movements, and chest and belly movement. You will be contacted by pulmonology to complete a sleep study.   Your physician has requested that you have an echocardiogram. Echocardiography is a painless test that uses sound waves to create images of your heart. It provides your doctor with information about the size and shape of your heart and how well your heart's chambers and valves are working. This procedure takes approximately one hour. There are no restrictions for this procedure. Please do NOT wear cologne, perfume, aftershave, or lotions (deodorant is allowed). Please arrive 15 minutes prior to your appointment time.  Follow-Up: At [facility], you and your health needs are our priority.  As part of our continuing mission to provide you with exceptional heart care, we have created designated Provider Care Teams.  These Care Teams include your primary Cardiologist (physician) and Advanced Practice Providers (APPs -  Physician Assistants and Nurse Practitioners) who all work together to provide you with the care you need, when you need it.  Your next appointment:   6 months  Provider:   Dr. Lalla Brothers

## 2023-04-06 ENCOUNTER — Other Ambulatory Visit: Payer: Self-pay | Admitting: Internal Medicine

## 2023-04-06 DIAGNOSIS — I10 Essential (primary) hypertension: Secondary | ICD-10-CM

## 2023-04-06 DIAGNOSIS — I2510 Atherosclerotic heart disease of native coronary artery without angina pectoris: Secondary | ICD-10-CM

## 2023-04-12 ENCOUNTER — Encounter: Payer: Self-pay | Admitting: Internal Medicine

## 2023-04-13 MED ORDER — TERBINAFINE HCL 250 MG PO TABS: 250.0000 mg | ORAL_TABLET | Freq: Every day | ORAL | 0 refills | Status: AC

## 2023-04-24 ENCOUNTER — Encounter: Payer: Self-pay | Admitting: Podiatry

## 2023-04-24 ENCOUNTER — Ambulatory Visit (INDEPENDENT_AMBULATORY_CARE_PROVIDER_SITE_OTHER): Payer: Medicare Other | Admitting: Podiatry

## 2023-04-24 DIAGNOSIS — B351 Tinea unguium: Secondary | ICD-10-CM

## 2023-04-24 DIAGNOSIS — M2041 Other hammer toe(s) (acquired), right foot: Secondary | ICD-10-CM | POA: Diagnosis not present

## 2023-04-24 NOTE — Patient Instructions (Signed)
The name of the toe splint is called a Budin splint (may also find as a toe regulator). [employer] likely has them. We use the Pedi-Fix brand

## 2023-04-24 NOTE — Progress Notes (Signed)
  Subjective:  Patient ID: Gregory Hale, male    DOB: 1951/09/03,  MRN: [id number]  Chief Complaint  Patient presents with   Nail Problem    "They're better I think.  The toe is not good but I don't think it's going to get any better."    72 y.o. male presents with the above complaint. History confirmed with patient.  Overall pleased with the progress he has several weeks left of the second round of Lamisil.  No new side effects.  Toe is still a problem, had worn the Budin splint some while playing golf and helped some but has lost it.  Objective:  Physical Exam: warm, good capillary refill, no trophic changes or ulcerative lesions, normal DP and PT pulses, normal sensory exam, onychomycosis, and bilaterally has hallux valgus deformity and hammertoe contractures, worse on the right than left, preulcerative callus on the dorsal PIPJ.  Onychomycosis with about 75% clearance from initial therapy     Assessment:   1. Hammertoe of second toe of right foot   2. Onychomycosis      Plan:  Patient was evaluated and treated and all questions answered.  He would prefer to continue nonoperative treatment for the hammertoe at this point, he will continue to utilize the Budin splint as needed.  He will let me know if this worsens and he would like to proceed with surgery.  Complete current Lamisil course, advised to let the nails to continue to grow out and as long as he is making good progress with clearance that we should not need further courses of Lamisil but he will update me on his progress.  The nails were debrided in length and thickness with a sharp nail nipper to tolerance.  Return if symptoms worsen or fail to improve.

## 2023-04-25 ENCOUNTER — Ambulatory Visit (HOSPITAL_COMMUNITY): Payer: Medicare Other | Attending: Cardiovascular Disease

## 2023-04-25 DIAGNOSIS — I495 Sick sinus syndrome: Secondary | ICD-10-CM | POA: Insufficient documentation

## 2023-04-25 DIAGNOSIS — I442 Atrioventricular block, complete: Secondary | ICD-10-CM | POA: Insufficient documentation

## 2023-04-25 LAB — ECHOCARDIOGRAM COMPLETE
Area-P 1/2: 2.34 cm2
S' Lateral: 2.5 cm

## 2023-04-30 NOTE — Progress Notes (Signed)
Office Visit    Patient Name: Gregory Hale Date of Encounter: 04/30/2023  Primary Care Provider:  Etta Grandchild, MD Primary Cardiologist:  Verne Carrow, MD Primary Electrophysiologist: Lanier Prude, MD   Past Medical History    Past Medical History:  Diagnosis Date   Arthritis    Ascending aorta dilatation (HCC)    Bradycardia    a. HR 40 in setting of acute inferior STEMI 03/2017.   CAD in native artery    a. inf STEMI 03/2017 a/w hypotension and bradycardia -> s/p DES to RCA, otherwise mild disease in LAD, moderate disease in ostium of the moderate caliber diagonal branch, normal LVEF >65%   Dilated aortic root (HCC)    Heart attack (HCC)    STEMI involving right coronary artery (HCC)    04/22/17 PCI/DES to mRCA, normal EF   Tobacco abuse    Quit in 1998   Past Surgical History:  Procedure Laterality Date   CORONARY STENT INTERVENTION N/A 04/22/2017   Procedure: Coronary Stent Intervention;  Surgeon: Kathleene Hazel, MD;  Location: [facility] INVASIVE CV LAB;  Service: Cardiovascular;  Laterality: N/A;   LEFT HEART CATH AND CORONARY ANGIOGRAPHY N/A 04/22/2017   Procedure: Left Heart Cath and Coronary Angiography;  Surgeon: Kathleene Hazel, MD;  Location: [facility] INVASIVE CV LAB;  Service: Cardiovascular;  Laterality: N/A;   parotid Left 1980   TONSILLECTOMY     as a child    Allergies  Allergies  Allergen Reactions   Advil [Ibuprofen] Other (See Comments)    bleeding     History of Present Illness    Gregory Hale is a 72 y.o. male with PMH of CAD s/p acute inferior STEMI treated with DES to occluded RCA, mild disease in the LAD and moderate disease in diagonal branch, ascending aortic dilation (stable at 41 mm), arthritis who presents today for 55-month follow-up of bradycardia.  Gregory Hale was initially seen by Dr. Clifton James in 2018 for inferior MI that occurred while playing golf. LHC showed thrombotic occlusion of RCA treated with DES x 1.  2D  echo was completed 04/25/2023 with an EF of 60-65% and no RWMA and grade 1 DD, mild ascending aortic dilation of 42 mmHg and no significant valvular abnormalities.  He was last seen on 02/23/2023 when he was referred by his PCP due to bradycardia.  He wore a 14-day ZIO monitor that showed tachybradycardia syndrome with nocturnal bradycardia and AV block. Toprol-XL was discontinued and patient was referred to EP for further evaluation.  He was seen by Dr. Lalla Brothers on 03/29/2023.  Through shared decision and patient's asymptomatic symptoms watchful waiting approach was elected versus PPM placement.  Since last being seen in the office patient reports he has been doing well with no new cardiac complaints since his previous visit.  His blood pressure today was controlled at 124/68 and heart rate was initially 44 bpm and was 58 bpm on recheck.  He reports taking his beta-blocker and blood pressure medicine prior to today's visit.  He remains asymptomatic with his current heart rate and has not changed any of his physical activities such as playing golf and volunteering with [employer].  He has been taking a little more time with certain tasks such as cutting the grass.  He takes breaks before cutting the backyard after cutting the front yard.  During today's visit we discussed the pathophysiology of conduction disease and also reviewed the results of his echocardiogram.  He is  scheduled to follow-up with pulmonology regarding sleep study information and appropriate test to proceed with moving forward.  We discussed details of his echo and all questions were answered to his understanding.  Patient denies chest pain, palpitations, dyspnea, PND, orthopnea, nausea, vomiting, dizziness, syncope, edema, weight gain, or early satiety.    Home Medications    Current Outpatient Medications  Medication Sig Dispense Refill   acetaminophen (TYLENOL) 500 MG tablet Take 500 mg by mouth every 6 (six) hours as needed.      amLODipine (NORVASC) 5 MG tablet TAKE 1 TABLET BY MOUTH DAILY 90 tablet 0   aspirin EC 81 MG tablet Take 1 tablet (81 mg total) by mouth daily. Swallow whole. 90 tablet 3   atorvastatin (LIPITOR) 80 MG tablet Take 1 tablet (80 mg total) by mouth every evening. 90 tablet 3   celecoxib (CELEBREX) 100 MG capsule Take 1 capsule (100 mg total) by mouth 2 (two) times daily. (Patient taking differently: Take 100 mg by mouth daily.) 180 capsule 0   irbesartan (AVAPRO) 300 MG tablet Take 1 tablet (300 mg total) by mouth daily. 90 tablet 1   metoprolol succinate (TOPROL XL) 25 MG 24 hr tablet Take 0.5 tablets (12.5 mg total) by mouth daily. (Patient taking differently: Take 6.25 mg by mouth daily.) 45 tablet 3   nitroGLYCERIN (NITROSTAT) 0.4 MG SL tablet Place 1 tablet (0.4 mg total) under the tongue every 5 (five) minutes as needed. 25 tablet 3   terbinafine (LAMISIL) 250 MG tablet Take 1 tablet (250 mg total) by mouth daily. 90 tablet 0   No current facility-administered medications for this visit.     Review of Systems  Please see the history of present illness.    (+) Snoring (+) Minor fatigue  All other systems reviewed and are otherwise negative except as noted above.  Physical Exam    Wt Readings from Last 3 Encounters:  03/29/23 181 lb (82.1 kg)  03/16/23 187 lb (84.8 kg)  02/23/23 191 lb 12.8 oz (87 kg)   BJ:YNWGN were no vitals filed for this visit.,There is no height or weight on file to calculate BMI.  Constitutional:      Appearance: Healthy appearance. Not in distress.  Neck:     Vascular: JVD normal.  Pulmonary:     Effort: Pulmonary effort is normal.     Breath sounds: No wheezing. No rales. Diminished in the bases Cardiovascular:     Sinus bradycardia. Regular rhythm. Normal S1. Normal S2.      Murmurs: There is no murmur.  Edema:    Peripheral edema absent.  Abdominal:     Palpations: Abdomen is soft non tender. There is no hepatomegaly.  Skin:    General: Skin  is warm and dry.  Neurological:     General: No focal deficit present.     Mental Status: Alert and oriented to person, place and time.     Cranial Nerves: Cranial nerves are intact.  EKG/LABS/ Recent Cardiac Studies    ECG personally reviewed by me today -none completed today   Lab Results  Component Value Date   WBC 7.3 03/16/2023   HGB 14.6 03/16/2023   HCT 44.6 03/16/2023   MCV 95.3 03/16/2023   PLT 176.0 03/16/2023   Lab Results  Component Value Date   CREATININE 1.00 03/16/2023   BUN 19 03/16/2023   NA 138 03/16/2023   K 5.1 03/16/2023   CL 103 03/16/2023   CO2 28 03/16/2023  Lab Results  Component Value Date   ALT 15 03/16/2023   AST 20 03/16/2023   ALKPHOS 73 03/16/2023   BILITOT 1.3 (H) 03/16/2023   Lab Results  Component Value Date   CHOL 121 04/18/2022   HDL 63.00 04/18/2022   LDLCALC 48 04/18/2022   TRIG 48.0 04/18/2022   CHOLHDL 2 04/18/2022    Lab Results  Component Value Date   HGBA1C 5.7 08/30/2021     Assessment & Plan    1.  Coronary artery disease: -s/p inferior STEMI treated with DES x 1 to occluded RCA. Today patient reports that he has been doing well with no new cardiac complaints since previous visit. -Continue GDMT with Lipitor 80 mg daily, ASA 81 mg daily, Toprol 6.25 mg daily  2.  Complete heart block: -14-day ZIO monitor worn that showed tachybradycardia with high degree AVB. -Due to asymptomatic nature watchful waiting was elected versus PPM placement -Today patient is asymptomatic and heart rate was initially 44 bpm and was 58 bpm on recheck. -Monitor heart rate and blood pressure over 1 week and contact office with findings.  3.  Essential hypertension: -Patient's blood pressure today was controlled at 124/68 -Continue irbesartan 150 mg, Norvasc 5 mg, and metoprolol 6.25 mg  4.  Sleep disturbance: -He is scheduled to follow-up with pulmonology to determine the appropriate sleep study to pursue in order to evaluate for  sleep apnea.  Disposition: Follow-up with Verne Carrow, MD or APP in 4 months    Medication Adjustments/Labs and Tests Ordered: Current medicines are reviewed at length with the patient today.  Concerns regarding medicines are outlined above.   Signed, Napoleon Form, Leodis Rains, NP 04/30/2023, 1:35 PM [facility]

## 2023-05-01 ENCOUNTER — Encounter: Payer: Self-pay | Admitting: Nurse Practitioner

## 2023-05-01 ENCOUNTER — Ambulatory Visit: Payer: Medicare Other | Attending: Nurse Practitioner | Admitting: Nurse Practitioner

## 2023-05-01 VITALS — BP 124/68 | HR 58 | Ht 69.5 in | Wt 189.4 lb

## 2023-05-01 DIAGNOSIS — I2510 Atherosclerotic heart disease of native coronary artery without angina pectoris: Secondary | ICD-10-CM | POA: Diagnosis not present

## 2023-05-01 DIAGNOSIS — I442 Atrioventricular block, complete: Secondary | ICD-10-CM | POA: Diagnosis not present

## 2023-05-01 DIAGNOSIS — G479 Sleep disorder, unspecified: Secondary | ICD-10-CM | POA: Diagnosis not present

## 2023-05-01 DIAGNOSIS — I10 Essential (primary) hypertension: Secondary | ICD-10-CM | POA: Diagnosis not present

## 2023-05-01 NOTE — Patient Instructions (Addendum)
Medication Instructions:  Your physician recommends that you continue on your current medications as directed. Please refer to the Current Medication list given to you today. *If you need a refill on your cardiac medications before your next appointment, please call your pharmacy*   Lab Work: None ordered   Testing/Procedures: None Ordered   Follow-Up: At [facility], you and your health needs are our priority.  As part of our continuing mission to provide you with exceptional heart care, we have created designated Provider Care Teams.  These Care Teams include your primary Cardiologist (physician) and Advanced Practice Providers (APPs -  Physician Assistants and Nurse Practitioners) who all work together to provide you with the care you need, when you need it.  We recommend signing up for the patient portal called "MyChart".  Sign up information is provided on this After Visit Summary.  MyChart is used to connect with patients for Virtual Visits (Telemedicine).  Patients are able to view lab/test results, encounter notes, upcoming appointments, etc.  Non-urgent messages can be sent to your provider as well.   To learn more about what you can do with MyChart, go to [email or web address].    Your next appointment:   FOLLOW UP AS SCHEDULED    Provider:   Verne Carrow, MD     Other Instructions: Check your heartrate daily for one week then contact the office with the readings

## 2023-05-05 ENCOUNTER — Institutional Professional Consult (permissible substitution): Payer: Medicare Other | Admitting: Primary Care

## 2023-05-05 ENCOUNTER — Ambulatory Visit (INDEPENDENT_AMBULATORY_CARE_PROVIDER_SITE_OTHER): Payer: Medicare Other | Admitting: Primary Care

## 2023-05-05 ENCOUNTER — Encounter: Payer: Self-pay | Admitting: Primary Care

## 2023-05-05 VITALS — BP 118/74 | HR 72 | Temp 98.5°F | Ht 69.0 in | Wt 187.0 lb

## 2023-05-05 DIAGNOSIS — R001 Bradycardia, unspecified: Secondary | ICD-10-CM

## 2023-05-05 DIAGNOSIS — R0683 Snoring: Secondary | ICD-10-CM

## 2023-05-05 DIAGNOSIS — Z8669 Personal history of other diseases of the nervous system and sense organs: Secondary | ICD-10-CM

## 2023-05-05 DIAGNOSIS — I2510 Atherosclerotic heart disease of native coronary artery without angina pectoris: Secondary | ICD-10-CM

## 2023-05-05 NOTE — Patient Instructions (Addendum)
Sleep apnea is defined as period of 10 seconds or longer when you stop breathing at night. This can happen multiple times a night. Dx sleep apnea is when this occurs more than 5 times an hour.    Mild OSA 5-15 apneic events an hour Moderate OSA 15-30 apneic events an hour Severe OSA > 30 apneic events an hour   Untreated sleep apnea puts you at higher risk for cardiac arrhythmias, pulmonary HTN, stroke and diabetes  Treatment options include weight loss, side sleeping position, oral appliance, CPAP therapy or referral to ENT for possible surgical options    Recommendations: Focus on side sleeping position or elevate head with wedge pillow 30 degrees Work on weight loss efforts if able  Do not drive if experiencing excessive daytime sleepiness of fatigue    Orders: Home sleep study re: loud snoring    Follow-up: Please call to schedule follow-up 1-2 weeks after completing home sleep study to review results and treatment if needed (can be virtual)  Sleep Apnea  Sleep apnea is a condition that affects your breathing while you are sleeping. Your tongue or soft tissue in your throat may block the flow of air while you sleep. You may have shallow breathing or stop breathing for short periods of time. People with sleep apnea may snore loudly. There are three kinds of sleep apnea: Obstructive sleep apnea. This kind is caused by a blocked or collapsed airway. This is the most common. Central sleep apnea. This kind happens when the part of the brain that controls breathing does not send the correct signals to the muscles that control breathing. Mixed sleep apnea. This is a combination of obstructive and central sleep apnea. What are the causes? The most common cause of sleep apnea is a collapsed or blocked airway. What increases the risk? Being very overweight. Having family members with sleep apnea. Having a tongue or tonsils that are larger than normal. Having a small airway or jaw  problems. Being older. What are the signs or symptoms? Loud snoring. Restless sleep. Trouble staying asleep. Being sleepy or tired during the day. Waking up gasping or choking. Having a headache in the morning. Mood swings. Having a hard time remembering things and concentrating. How is this diagnosed? A medical history. A physical exam. A sleep study. This is also called a polysomnography test. This test is done at a sleep lab or in your home while you are sleeping. How is this treated? Treatment may include: Sleeping on your side. Losing weight if you're overweight. Wearing an oral appliance. This is a mouthpiece that moves your lower jaw forward. Using a positive airway pressure (PAP) device to keep your airways open while you sleep, such as: A continuous positive airway pressure (CPAP) device. This device gives forced air through a mask when you breathe out. This keeps your airways open. A bilevel positive airway pressure (BIPAP) device. This device gives forced air through a mask when you breathe in and when you breathe out to keep your airways open. Having surgery if other treatments do not work. If your sleep apnea is not treated, you may be at risk for: Heart failure. Heart attack. Stroke. Type 2 diabetes or a problem with your blood sugar called insulin resistance. Follow these instructions at home: Medicines Take your medicines only as told by your health care provider. Avoid alcohol, medicines to help you relax, and certain pain medicines. These may make sleep apnea worse. General instructions Do not smoke, vape, or use products  with nicotine or tobacco in them. If you need help quitting, talk with your provider. If you were given a PAP device to open your airway while you sleep, use it as told by your provider. If you're having surgery, make sure to tell your provider you have sleep apnea. You may need to bring your PAP device with you. Contact a health care provider  if: The PAP device that you were given to use during sleep bothers you or does not seem to be working. You do not feel better or you feel worse. Get help right away if: You have trouble breathing. You have chest pain. You have trouble talking. One side of your body feels weak. A part of your face is hanging down. These symptoms may be an emergency. Call 911 right away. Do not wait to see if the symptoms will go away. Do not drive yourself to the hospital. This information is not intended to replace advice given to you by your health care provider. Make sure you discuss any questions you have with your health care provider. Document Revised: 11/17/2022 Document Reviewed: 11/17/2022 Elsevier Patient Education  2024 [employer].

## 2023-05-05 NOTE — Assessment & Plan Note (Addendum)
-   Following with cardiology. Zio patch showed tachybradycardia with AVB. Patient is asymptomatic. Continue Toprol XL 6.25 mg daily as directed.

## 2023-05-05 NOTE — Progress Notes (Signed)
@Patient  ID: Gregory Hale, male    DOB: Aug 01, 1951, 72 y.o.   MRN: [id number]  Chief Complaint  Patient presents with   Consult    Snoring and low heart rate during sleep.  Snoring x years.    Referring provider: Lanier Prude, MD  HPI: 72 year old male, former smoker.  Past medical history significant for hypertension, ascending aorta dilation, STEMI s/p DES, hypothyroidism, osteoarthritis, chronic back pain.  05/05/2023 Patient presents today for sleep consult. Referred by cardiology due to asymptomatic bradycardia. He had a sleep study over 10+ year ago, results are not available. He was told that he had mild sleep apnea. He was never started on CPAP. He does have snoring symptoms, otherwise sleeps ok. He goes to bed between 8:30-9 PM.  It takes him 15 to 30 minutes to fall asleep.  He wakes up on average 1-2 times a night.  He starts his day between 5 and 6 AM. Denies significant daytime sleepiness. Family member has seen him fall asleep during the day when inactive. No concern for narcolepsy or cataplexy. Epworth score 2/24.   Allergies  Allergen Reactions   Advil [Ibuprofen] Other (See Comments)    bleeding    Immunization History  Administered Date(s) Administered   Fluad Quad(high Dose 65+) 06/06/2019   Influenza, High Dose Seasonal PF 06/28/2017, 07/03/2018   Influenza,inj,Quad PF,6+ Mos 07/20/2015   Influenza-Unspecified 07/21/2021, 06/26/2022   PFIZER(Purple Top)SARS-COV-2 Vaccination 10/14/2019, 11/08/2019, 06/21/2020   Pneumococcal Conjugate-13 01/29/2018   Pneumococcal Polysaccharide-23 03/18/2019   Td 09/26/2008   Tdap 12/11/2018   Zoster Recombinant(Shingrix) 06/09/2018, 07/30/2018   Zoster, Live 07/30/2015    Past Medical History:  Diagnosis Date   Arthritis    Ascending aorta dilatation (HCC)    Bradycardia    a. HR 40 in setting of acute inferior STEMI 03/2017.   CAD in native artery    a. inf STEMI 03/2017 a/w hypotension and bradycardia -> s/p DES  to RCA, otherwise mild disease in LAD, moderate disease in ostium of the moderate caliber diagonal branch, normal LVEF >65%   Dilated aortic root (HCC)    Heart attack (HCC)    STEMI involving right coronary artery (HCC)    04/22/17 PCI/DES to mRCA, normal EF   Tobacco abuse    Quit in 1998    Tobacco History: Social History   Tobacco Use  Smoking Status Former   Current packs/day: 0.00   Types: Cigarettes   Quit date: 1990   Years since quitting: 34.6  Smokeless Tobacco Never   Counseling given: Not Answered   Outpatient Medications Prior to Visit  Medication Sig Dispense Refill   acetaminophen (TYLENOL) 500 MG tablet Take 500 mg by mouth every 6 (six) hours as needed.     amLODipine (NORVASC) 5 MG tablet TAKE 1 TABLET BY MOUTH DAILY 90 tablet 0   aspirin EC 81 MG tablet Take 1 tablet (81 mg total) by mouth daily. Swallow whole. 90 tablet 3   atorvastatin (LIPITOR) 80 MG tablet Take 1 tablet (80 mg total) by mouth every evening. 90 tablet 3   celecoxib (CELEBREX) 100 MG capsule Take 1 capsule (100 mg total) by mouth 2 (two) times daily. (Patient taking differently: Take 100 mg by mouth daily.) 180 capsule 0   famotidine (PEPCID) 10 MG tablet Take 10 mg by mouth daily.     irbesartan (AVAPRO) 300 MG tablet Take 1 tablet (300 mg total) by mouth daily. 90 tablet 1   metoprolol succinate (TOPROL-XL) 25  MG 24 hr tablet Take 6.25 mg by mouth daily.     nitroGLYCERIN (NITROSTAT) 0.4 MG SL tablet Place 1 tablet (0.4 mg total) under the tongue every 5 (five) minutes as needed. 25 tablet 3   terbinafine (LAMISIL) 250 MG tablet Take 1 tablet (250 mg total) by mouth daily. 90 tablet 0   No facility-administered medications prior to visit.    Review of Systems  Review of Systems  Constitutional: Negative.   HENT: Negative.    Respiratory: Negative.    Cardiovascular: Negative.   Psychiatric/Behavioral:  Positive for sleep disturbance.    Physical Exam  BP 118/74 (BP Location:  Left Arm, Patient Position: Sitting, Cuff Size: Large)   Pulse 72   Temp 98.5 F (36.9 C) (Oral)   Ht 5\' 9"  (1.753 m)   Wt 187 lb (84.8 kg)   SpO2 97%   BMI 27.62 kg/m  Physical Exam Constitutional:      Appearance: Normal appearance.  HENT:     Head: Normocephalic and atraumatic.     Mouth/Throat:     Mouth: Mucous membranes are moist.     Pharynx: Oropharynx is clear.  Cardiovascular:     Rate and Rhythm: Normal rate and regular rhythm.  Pulmonary:     Effort: Pulmonary effort is normal.     Breath sounds: Normal breath sounds.  Skin:    General: Skin is warm and dry.  Neurological:     General: No focal deficit present.     Mental Status: He is alert and oriented to person, place, and time. Mental status is at baseline.  Psychiatric:        Mood and Affect: Mood normal.        Behavior: Behavior normal.        Thought Content: Thought content normal.        Judgment: Judgment normal.      Lab Results:  CBC    Component Value Date/Time   WBC 7.3 03/16/2023 0945   RBC 4.68 03/16/2023 0945   HGB 14.6 03/16/2023 0945   HCT 44.6 03/16/2023 0945   HCT 46 06/24/2011 0000   PLT 176.0 03/16/2023 0945   PLT 154 06/24/2011 0000   MCV 95.3 03/16/2023 0945   MCH 31.3 04/24/2017 0322   MCHC 32.8 03/16/2023 0945   RDW 14.3 03/16/2023 0945   RDW 13.4 06/24/2011 0000   LYMPHSABS 1.7 03/16/2023 0945   MONOABS 0.7 03/16/2023 0945   EOSABS 0.1 03/16/2023 0945   BASOSABS 0.0 03/16/2023 0945    BMET    Component Value Date/Time   NA 138 03/16/2023 0945   NA 139 08/09/2021 1046   K 5.1 03/16/2023 0945   K 5.2 06/24/2011 0000   CL 103 03/16/2023 0945   CL 103 06/24/2011 0000   CO2 28 03/16/2023 0945   GLUCOSE 110 (H) 03/16/2023 0945   BUN 19 03/16/2023 0945   BUN 12 08/09/2021 1046   CREATININE 1.00 03/16/2023 0945   CREATININE 1.08 06/24/2011 0000   CALCIUM 9.2 03/16/2023 0945   CALCIUM 9.5 06/24/2011 0000   GFRNONAA 76 07/20/2020 0938   GFRAA 88 07/20/2020  0938    BNP No results found for: "BNP"  ProBNP No results found for: "PROBNP"  Imaging: ECHOCARDIOGRAM COMPLETE  Result Date: 04/25/2023    ECHOCARDIOGRAM REPORT   Patient Name:   Ociel Mize Date of Exam: 04/25/2023 Medical Rec #:  [id number]     Height:       69.0 in Accession #:  [id number]    Weight:       181.0 lb Date of Birth:  November 19, 1950      BSA:          1.980 m Patient Age:    71 years      BP:           118/76 mmHg Patient Gender: M             HR:           58 bpm. Exam Location:  [address] Procedure: 2D Echo, 3D Echo, Cardiac Doppler and Color Doppler Indications:    I49.5 Tachy-Brady Syndrome  History:        Patient has prior history of Echocardiogram examinations, most                 recent 04/23/2017. Previous Myocardial Infarction and CAD,                 Abnormal ECG, Arrythmias:Tachycardia and Bradycardia; Risk                 Factors:Former Smoker, Hypertension, Family History of Coronary                 Artery Disease and Sleep Apnea. Complete Heart Block,                 Tachy/Brady Syndrome, Ascending Aortic Dilation.  Sonographer:    Farrel Conners RDCS Referring Phys: Lanier Prude IMPRESSIONS  1. Left ventricular ejection fraction, by estimation, is 60 to 65%. The left ventricle has normal function. The left ventricle has no regional wall motion abnormalities. Left ventricular diastolic parameters are consistent with Grade I diastolic dysfunction (impaired relaxation).  2. Right ventricular systolic function is normal. The right ventricular size is normal. There is normal pulmonary artery systolic pressure. The estimated right ventricular systolic pressure is 28.8 mmHg.  3. The mitral valve is grossly normal. Trivial mitral valve regurgitation. No evidence of mitral stenosis.  4. The aortic valve is tricuspid. Aortic valve regurgitation is trivial. No aortic stenosis is present.  5. Aortic dilatation noted. There is mild dilatation of the aortic root, measuring 40  mm. There is mild dilatation of the ascending aorta, measuring 42 mm.  6. The inferior vena cava is normal in size with greater than 50% respiratory variability, suggesting right atrial pressure of 3 mmHg. FINDINGS  Left Ventricle: Left ventricular ejection fraction, by estimation, is 60 to 65%. The left ventricle has normal function. The left ventricle has no regional wall motion abnormalities. 3D ejection fraction reviewed and evaluated as part of the interpretation. Alternate measurement of EF is felt to be most reflective of LV function. The left ventricular internal cavity size was normal in size. There is no left ventricular hypertrophy. Left ventricular diastolic parameters are consistent with Grade I diastolic dysfunction (impaired relaxation). Right Ventricle: The right ventricular size is normal. No increase in right ventricular wall thickness. Right ventricular systolic function is normal. There is normal pulmonary artery systolic pressure. The tricuspid regurgitant velocity is 2.54 m/s, and  with an assumed right atrial pressure of 3 mmHg, the estimated right ventricular systolic pressure is 28.8 mmHg. Left Atrium: Left atrial size was normal in size. Right Atrium: Right atrial size was normal in size. Pericardium: There is no evidence of pericardial effusion. Mitral Valve: The mitral valve is grossly normal. Trivial mitral valve regurgitation. No evidence of mitral valve stenosis. Tricuspid Valve: The tricuspid valve is grossly normal. Tricuspid valve  regurgitation is trivial. No evidence of tricuspid stenosis. Aortic Valve: The aortic valve is tricuspid. Aortic valve regurgitation is trivial. No aortic stenosis is present. Pulmonic Valve: The pulmonic valve was grossly normal. Pulmonic valve regurgitation is trivial. No evidence of pulmonic stenosis. Aorta: Aortic dilatation noted. There is mild dilatation of the aortic root, measuring 40 mm. There is mild dilatation of the ascending aorta, measuring  42 mm. Venous: The inferior vena cava is normal in size with greater than 50% respiratory variability, suggesting right atrial pressure of 3 mmHg. IAS/Shunts: The atrial septum is grossly normal.  LEFT VENTRICLE PLAX 2D LVIDd:         4.00 cm   Diastology LVIDs:         2.50 cm   LV e' medial:    6.20 cm/s LV PW:         0.90 cm   LV E/e' medial:  12.0 LV IVS:        1.20 cm   LV e' lateral:   9.01 cm/s LVOT diam:     2.65 cm   LV E/e' lateral: 8.3 LV SV:         121 LV SV Index:   61 LVOT Area:     5.52 cm                           3D Volume EF:                          3D EF:        67 %                          LV EDV:       126 ml                          LV ESV:       42 ml                          LV SV:        84 ml RIGHT VENTRICLE RV Basal diam:  3.80 cm RVSP:           28.8 mmHg LEFT ATRIUM             Index        RIGHT ATRIUM           Index LA diam:        4.30 cm 2.17 cm/m   RA Pressure: 3.00 mmHg LA Vol (A2C):   51.6 ml 26.06 ml/m  RA Area:     18.80 cm LA Vol (A4C):   41.3 ml 20.86 ml/m  RA Volume:   57.10 ml  28.84 ml/m LA Biplane Vol: 47.2 ml 23.84 ml/m  AORTIC VALVE LVOT Vmax:   88.50 cm/s LVOT Vmean:  58.675 cm/s LVOT VTI:    0.220 m  AORTA Ao Root diam: 4.13 cm Ao Asc diam:  3.90 cm MITRAL VALVE                TRICUSPID VALVE MV Area (PHT   cm          TR Peak grad:   25.8 mmHg MV Decel Time: 324 msec     TR Vmax:  254.00 cm/s MV E velocity: 74.40 cm/s   Estimated RAP:  3.00 mmHg MV A velocity: 114.00 cm/s  RVSP:           28.8 mmHg MV E/A ratio:  0.65                             SHUNTS                             Systemic VTI:  0.22 m                             Systemic Diam: 2.65 cm Lennie Odor MD Electronically signed by Lennie Odor MD Signature Date/Time: 04/25/2023/8:30:59 PM    Final      Assessment & Plan:   Loud snoring - Patient has symptoms of loud snoring. Hx sleep apnea, never started on CPAP. Last sleep study was > 10 years ago. Referred by cardiology due to  bradycardia. Epworth score 2/24. BMI 27. No significant daytime sleepiness. Needs repeat sleep testing to re-assess degree of OSA. Ordered for HST. Reviewed risks of untreated sleep apnea including cardiac arrhythmias, pulmonary hypertension, diabetes and stroke.  We also discussed treatment options including weight loss, oral appliance, CPAP therapy referral to ENT for possible surgical options. FU 1-2 week after sleep study to review sleep study results and treatment options if needed.   Bradycardia, drug induced - Following with cardiology. Zio patch showed tachybradycardia with AVB. Patient is asymptomatic. Continue Toprol XL 6.25 mg daily as directed.   CAD in native artery - Hx STEMI s/p DES - Continue Lipitor and ASA as directed   Glenford Bayley, NP 05/05/2023

## 2023-05-05 NOTE — Progress Notes (Signed)
Reviewed and agree with assessment/plan.   Coralyn Helling, MD [facility] Pulmonary/Critical Care 05/05/2023, 3:42 PM Pager:  [phone]

## 2023-05-05 NOTE — Assessment & Plan Note (Addendum)
-   Patient has symptoms of loud snoring. Hx sleep apnea, never started on CPAP. Last sleep study was > 10 years ago. Referred by cardiology due to bradycardia. Epworth score 2/24. BMI 27. No significant daytime sleepiness. Needs repeat sleep testing to re-assess degree of OSA. Ordered for HST. Reviewed risks of untreated sleep apnea including cardiac arrhythmias, pulmonary hypertension, diabetes and stroke.  We also discussed treatment options including weight loss, oral appliance, CPAP therapy referral to ENT for possible surgical options. FU 1-2 week after sleep study to review sleep study results and treatment options if needed.

## 2023-05-05 NOTE — Assessment & Plan Note (Signed)
-   Hx STEMI s/p DES - Continue Lipitor and ASA as directed

## 2023-05-11 ENCOUNTER — Encounter (INDEPENDENT_AMBULATORY_CARE_PROVIDER_SITE_OTHER): Payer: Self-pay

## 2023-05-13 DIAGNOSIS — G4733 Obstructive sleep apnea (adult) (pediatric): Secondary | ICD-10-CM | POA: Diagnosis not present

## 2023-05-24 ENCOUNTER — Encounter: Payer: Self-pay | Admitting: Internal Medicine

## 2023-05-30 ENCOUNTER — Ambulatory Visit: Payer: Medicare Other | Admitting: Physician Assistant

## 2023-05-31 ENCOUNTER — Other Ambulatory Visit: Payer: Self-pay | Admitting: Internal Medicine

## 2023-05-31 DIAGNOSIS — K644 Residual hemorrhoidal skin tags: Secondary | ICD-10-CM | POA: Insufficient documentation

## 2023-06-01 ENCOUNTER — Ambulatory Visit (INDEPENDENT_AMBULATORY_CARE_PROVIDER_SITE_OTHER): Payer: Medicare Other | Admitting: Primary Care

## 2023-06-01 DIAGNOSIS — G4733 Obstructive sleep apnea (adult) (pediatric): Secondary | ICD-10-CM

## 2023-06-01 DIAGNOSIS — Z8669 Personal history of other diseases of the nervous system and sense organs: Secondary | ICD-10-CM

## 2023-06-01 DIAGNOSIS — R0683 Snoring: Secondary | ICD-10-CM

## 2023-06-05 ENCOUNTER — Encounter: Payer: Self-pay | Admitting: Cardiology

## 2023-06-05 ENCOUNTER — Other Ambulatory Visit: Payer: Self-pay | Admitting: Internal Medicine

## 2023-06-05 DIAGNOSIS — G8929 Other chronic pain: Secondary | ICD-10-CM

## 2023-06-05 DIAGNOSIS — M159 Polyosteoarthritis, unspecified: Secondary | ICD-10-CM

## 2023-06-06 NOTE — Progress Notes (Signed)
Please call patient and let him know that home sleep study was resulted yesterday.  It did show he has severe obstructive sleep apnea.  Recommend he be started on auto CPAP 5 to 20 cm H2O.  If patient is okay with this please place order.  He needs to wear CPAP nightly for 4 to 6 hours or longer.  He will need to schedule a follow-up in 6 to 8 weeks for CPAP compliance.  Can be virtual or in person.

## 2023-06-07 MED ORDER — CELECOXIB 100 MG PO CAPS: 100.0000 mg | ORAL_CAPSULE | Freq: Two times a day (BID) | ORAL | 0 refills | Status: DC

## 2023-06-12 ENCOUNTER — Encounter: Payer: Self-pay | Admitting: Internal Medicine

## 2023-06-12 ENCOUNTER — Ambulatory Visit (INDEPENDENT_AMBULATORY_CARE_PROVIDER_SITE_OTHER): Payer: Medicare Other | Admitting: Internal Medicine

## 2023-06-12 VITALS — BP 136/78 | HR 63 | Temp 98.1°F | Ht 69.0 in | Wt 191.0 lb

## 2023-06-12 DIAGNOSIS — E039 Hypothyroidism, unspecified: Secondary | ICD-10-CM

## 2023-06-12 DIAGNOSIS — E785 Hyperlipidemia, unspecified: Secondary | ICD-10-CM | POA: Diagnosis not present

## 2023-06-12 DIAGNOSIS — R001 Bradycardia, unspecified: Secondary | ICD-10-CM | POA: Diagnosis not present

## 2023-06-12 DIAGNOSIS — T50905A Adverse effect of unspecified drugs, medicaments and biological substances, initial encounter: Secondary | ICD-10-CM

## 2023-06-12 DIAGNOSIS — I2510 Atherosclerotic heart disease of native coronary artery without angina pectoris: Secondary | ICD-10-CM | POA: Diagnosis not present

## 2023-06-12 LAB — LIPID PANEL
Cholesterol: 124 mg/dL (ref 0–200)
HDL: 75.7 mg/dL (ref >39.00)
LDL Cholesterol: 40 mg/dL (ref 0–99)
NonHDL: 48.35
Total CHOL/HDL Ratio: 2
Triglycerides: 42 mg/dL (ref 0.0–149.0)
VLDL: 8.4 mg/dL (ref 0.0–40.0)

## 2023-06-12 LAB — TSH: TSH: 2.93 u[IU]/mL (ref 0.35–5.50)

## 2023-06-12 NOTE — Progress Notes (Signed)
Subjective:  Patient ID: Gregory Hale, male    DOB: Jun 13, 1951  Age: 72 y.o. MRN: [id number]  CC: Hypertension and Hyperlipidemia   HPI [name] presents for f/up -----  Discussed the use of AI scribe software for clinical note transcription with the patient, who gave verbal consent to proceed.  History of Present Illness   The patient, with a history of hemorrhoids, presents with worsening pain and constipation. They have been managing the constipation with a nightly powdered laxative, which has kept the stool soft and somewhat manageable. They report straining to have a bowel movement about six to eight weeks ago, which led to an upset stomach and subsequent constipation. They have an upcoming appointment with a surgeon to address the hemorrhoids, which they describe as large and painful.  In addition to the gastrointestinal issues, the patient reports a low heart rate, which was previously attributed to sleep apnea. They recently underwent a sleep study, which revealed a significant amount of apnea. They are scheduled to discuss treatment options, including the possibility of using a CPAP machine, at an upcoming appointment.       Outpatient Medications Prior to Visit  Medication Sig Dispense Refill   acetaminophen (TYLENOL) 500 MG tablet Take 500 mg by mouth every 6 (six) hours as needed.     amLODipine (NORVASC) 5 MG tablet TAKE 1 TABLET BY MOUTH DAILY 90 tablet 0   aspirin EC 81 MG tablet Take 1 tablet (81 mg total) by mouth daily. Swallow whole. 90 tablet 3   atorvastatin (LIPITOR) 80 MG tablet Take 1 tablet (80 mg total) by mouth every evening. 90 tablet 3   celecoxib (CELEBREX) 100 MG capsule Take 1 capsule (100 mg total) by mouth 2 (two) times daily. 180 capsule 0   famotidine (PEPCID) 10 MG tablet Take 10 mg by mouth daily.     irbesartan (AVAPRO) 300 MG tablet Take 1 tablet (300 mg total) by mouth daily. 90 tablet 1   metoprolol succinate (TOPROL-XL) 25 MG 24 hr tablet  Take 6.25 mg by mouth daily.     nitroGLYCERIN (NITROSTAT) 0.4 MG SL tablet Place 1 tablet (0.4 mg total) under the tongue every 5 (five) minutes as needed. 25 tablet 3   terbinafine (LAMISIL) 250 MG tablet Take 1 tablet (250 mg total) by mouth daily. 90 tablet 0   No facility-administered medications prior to visit.    ROS Review of Systems  Constitutional: Negative.  Negative for diaphoresis and fatigue.  HENT: Negative.    Respiratory: Negative.  Negative for cough, chest tightness, shortness of breath and wheezing.   Cardiovascular:  Negative for chest pain, palpitations and leg swelling.  Gastrointestinal:  Positive for rectal pain. Negative for abdominal pain, anal bleeding, blood in stool, constipation, diarrhea, nausea and vomiting.  Genitourinary: Negative.  Negative for difficulty urinating.  Musculoskeletal: Negative.  Negative for arthralgias and myalgias.  Skin: Negative.   Neurological: Negative.  Negative for dizziness and weakness.  Hematological:  Negative for adenopathy. Does not bruise/bleed easily.  Psychiatric/Behavioral: Negative.      Objective:  BP 136/78 (BP Location: Right Arm, Patient Position: Sitting, Cuff Size: Large)   Pulse 63   Temp 98.1 F (36.7 C) (Oral)   Ht 5\' 9"  (1.753 m)   Wt 191 lb (86.6 kg)   SpO2 98%   BMI 28.21 kg/m   BP Readings from Last 3 Encounters:  06/12/23 136/78  05/05/23 118/74  05/01/23 124/68    Wt Readings from Last 3  Encounters:  06/12/23 191 lb (86.6 kg)  05/05/23 187 lb (84.8 kg)  05/01/23 189 lb 6.4 oz (85.9 kg)    Physical Exam Vitals reviewed.  Constitutional:      Appearance: Normal appearance.  HENT:     Nose: Nose normal.     Mouth/Throat:     Mouth: Mucous membranes are moist.  Eyes:     General: No scleral icterus.    Conjunctiva/sclera: Conjunctivae normal.  Cardiovascular:     Rate and Rhythm: Regular rhythm. Bradycardia present.     Heart sounds: No murmur heard. Pulmonary:     Effort:  Pulmonary effort is normal.     Breath sounds: No stridor. No wheezing, rhonchi or rales.  Abdominal:     General: Abdomen is flat.     Palpations: There is no mass.     Tenderness: There is no abdominal tenderness. There is no guarding.     Hernia: No hernia is present. There is no hernia in the left inguinal area or right inguinal area.  Genitourinary:    Pubic Area: No rash.      Penis: Normal and circumcised.      Testes: Normal.     Epididymis:     Right: Normal.     Left: Normal.     Prostate: Enlarged. Not tender and no nodules present.     Rectum: Guaiac result negative. External hemorrhoid and internal hemorrhoid present. No mass, tenderness or anal fissure. Normal anal tone.     Comments: Uncomplicated I/E anal hemorrhoids Musculoskeletal:        General: Normal range of motion.     Cervical back: Neck supple.     Right lower leg: No edema.     Left lower leg: No edema.  Lymphadenopathy:     Lower Body: No right inguinal adenopathy. No left inguinal adenopathy.  Skin:    General: Skin is warm and dry.  Neurological:     General: No focal deficit present.     Mental Status: He is alert. Mental status is at baseline.  Psychiatric:        Mood and Affect: Mood normal.        Behavior: Behavior normal.     Lab Results  Component Value Date   WBC 7.3 03/16/2023   HGB 14.6 03/16/2023   HCT 44.6 03/16/2023   PLT 176.0 03/16/2023   GLUCOSE 110 (H) 03/16/2023   CHOL 124 06/12/2023   TRIG 42.0 06/12/2023   HDL 75.70 06/12/2023   LDLCALC 40 06/12/2023   ALT 15 03/16/2023   AST 20 03/16/2023   NA 138 03/16/2023   K 5.1 03/16/2023   CL 103 03/16/2023   CREATININE 1.00 03/16/2023   BUN 19 03/16/2023   CO2 28 03/16/2023   TSH 2.93 06/12/2023   PSA 0.72 04/18/2022   HGBA1C 5.7 08/30/2021    MR ANGIO PELVIS W WO CONTRAST  Result Date: 08/27/2021 CLINICAL DATA:  Thoracic aortic aneurysm follow-up EXAM: MRA CHEST ABDOMEN AND PELVIS WITH CONTRAST TECHNIQUE:  Multiplanar, multiecho pulse sequences of the chest abdomen and pelvis were obtained with intravenous contrast. Angiographic images of abdomen and pelvis were obtained using MRA technique with intravenous contrast. CONTRAST:  8mL GADAVIST GADOBUTROL 1 MMOL/ML IV SOLN COMPARISON:  None. FINDINGS: MRA CHEST Vascular Mild fusiform aneurysmal dilation of the tubular portion of the ascending thoracic aorta with a maximal diameter of 4.1 cm. This is perhaps incrementally larger compared to 4 cm measured previously. The aortic root remains  normal in size as does the transverse and descending thoracic aorta. The descending thoracic aorta is tortuous. Conventional 3 vessel arch anatomy. No evidence of dissection. The heart is normal in size. Unremarkable main pulmonary artery. Non Vascular No focal signal abnormality or abnormal enhancement within the lungs, pleura, mediastinum or musculoskeletal structures. No pleural effusion. MRA ABDOMEN Vascular Aorta: No evidence of aneurysm or dissection. Celiac: Widely patent. No evidence of aneurysm, dissection or significant stenosis. SMA: Widely patent. No evidence of aneurysm, dissection or significant stenosis. Renals: Solitary renal arteries without evidence of aneurysm, dissection, significant stenosis or FMD. IMA: Patent without evidence of stenosis, dissection or aneurysm. Veins: No focal abnormalities. Non Vascular Multiple small circumscribed T2 hyperintense T1 hypointense nonenhancing lesions scattered throughout the liver consistent with small simple cysts. Stable 2.2 cm left adrenal adenoma dating back to November of 2018. Dextroconvex scoliosis of the lumbar spine with multilevel degenerative disc disease. Otherwise, no focal signal abnormality or abnormal enhancement involving the spleen, pancreas, kidneys, stomach, bowel, or lower genitourinary tract. MRA PELVIS Vascular Inflow: Tortuous with but patent. No evidence of aneurysm, dissection, stenosis or occlusion.  Proximal Outflow: No evidence of aneurysm, dissection, stenosis or occlusion. Atherosclerotic plaque visualized along the posterior walls of the common femoral arteries bilaterally. Veins: Patent.  No focal abnormality. Non Vascular No signal abnormality or abnormal enhancement. IMPRESSION: 1. Essentially stable mild aneurysmal dilation of the ascending thoracic aorta with a maximal diameter of 4.1 cm. Four year stability is reassuring. This aortic diameter may be normal for the patient's body surface area. Consider follow-up surveillance imaging every other year. 2. Stable ectasia of the aortic root at 4.4 cm. 3. The distal descending thoracic aorta and upper abdominal aorta are tortuous but nonaneurysmal. 4. No evidence of abdominal aortic, iliac or visceral artery aneurysm. 5. Mild atherosclerotic plaque in the aorta and bilateral common femoral arteries without significant stenosis. 6. Dextroconvex scoliosis of the lumbar spine with associated multilevel degenerative disc disease. 7. Small benign hepatic cysts. 8. Stable left adrenal adenoma. Signed, Sterling Big, MD, RPVI [facility] Radiology Electronically Signed   By: Malachy Moan M.D.   On: 08/27/2021 13:24   MR ANGIO ABDOMEN W WO CONTRAST  Result Date: 08/27/2021 CLINICAL DATA:  Thoracic aortic aneurysm follow-up EXAM: MRA CHEST ABDOMEN AND PELVIS WITH CONTRAST TECHNIQUE: Multiplanar, multiecho pulse sequences of the chest abdomen and pelvis were obtained with intravenous contrast. Angiographic images of abdomen and pelvis were obtained using MRA technique with intravenous contrast. CONTRAST:  8mL GADAVIST GADOBUTROL 1 MMOL/ML IV SOLN COMPARISON:  None. FINDINGS: MRA CHEST Vascular Mild fusiform aneurysmal dilation of the tubular portion of the ascending thoracic aorta with a maximal diameter of 4.1 cm. This is perhaps incrementally larger compared to 4 cm measured previously. The aortic root  remains normal in size as does the transverse and descending thoracic aorta. The descending thoracic aorta is tortuous. Conventional 3 vessel arch anatomy. No evidence of dissection. The heart is normal in size. Unremarkable main pulmonary artery. Non Vascular No focal signal abnormality or abnormal enhancement within the lungs, pleura, mediastinum or musculoskeletal structures. No pleural effusion. MRA ABDOMEN Vascular Aorta: No evidence of aneurysm or dissection. Celiac: Widely patent. No evidence of aneurysm, dissection or significant stenosis. SMA: Widely patent. No evidence of aneurysm, dissection or significant stenosis. Renals: Solitary renal arteries without evidence of aneurysm, dissection, significant stenosis or FMD. IMA: Patent without evidence of stenosis, dissection or aneurysm. Veins: No focal abnormalities. Non Vascular Multiple small  circumscribed T2 hyperintense T1 hypointense nonenhancing lesions scattered throughout the liver consistent with small simple cysts. Stable 2.2 cm left adrenal adenoma dating back to November of 2018. Dextroconvex scoliosis of the lumbar spine with multilevel degenerative disc disease. Otherwise, no focal signal abnormality or abnormal enhancement involving the spleen, pancreas, kidneys, stomach, bowel, or lower genitourinary tract. MRA PELVIS Vascular Inflow: Tortuous with but patent. No evidence of aneurysm, dissection, stenosis or occlusion. Proximal Outflow: No evidence of aneurysm, dissection, stenosis or occlusion. Atherosclerotic plaque visualized along the posterior walls of the common femoral arteries bilaterally. Veins: Patent.  No focal abnormality. Non Vascular No signal abnormality or abnormal enhancement. IMPRESSION: 1. Essentially stable mild aneurysmal dilation of the ascending thoracic aorta with a maximal diameter of 4.1 cm. Four year stability is reassuring. This aortic diameter may be normal for the patient's body surface area. Consider follow-up  surveillance imaging every other year. 2. Stable ectasia of the aortic root at 4.4 cm. 3. The distal descending thoracic aorta and upper abdominal aorta are tortuous but nonaneurysmal. 4. No evidence of abdominal aortic, iliac or visceral artery aneurysm. 5. Mild atherosclerotic plaque in the aorta and bilateral common femoral arteries without significant stenosis. 6. Dextroconvex scoliosis of the lumbar spine with associated multilevel degenerative disc disease. 7. Small benign hepatic cysts. 8. Stable left adrenal adenoma. Signed, Sterling Big, MD, RPVI [facility] Radiology Electronically Signed   By: Malachy Moan M.D.   On: 08/27/2021 13:24   MR Angiogram Chest W Wo Contrast  Result Date: 08/27/2021 CLINICAL DATA:  Thoracic aortic aneurysm follow-up EXAM: MRA CHEST ABDOMEN AND PELVIS WITH CONTRAST TECHNIQUE: Multiplanar, multiecho pulse sequences of the chest abdomen and pelvis were obtained with intravenous contrast. Angiographic images of abdomen and pelvis were obtained using MRA technique with intravenous contrast. CONTRAST:  8mL GADAVIST GADOBUTROL 1 MMOL/ML IV SOLN COMPARISON:  None. FINDINGS: MRA CHEST Vascular Mild fusiform aneurysmal dilation of the tubular portion of the ascending thoracic aorta with a maximal diameter of 4.1 cm. This is perhaps incrementally larger compared to 4 cm measured previously. The aortic root remains normal in size as does the transverse and descending thoracic aorta. The descending thoracic aorta is tortuous. Conventional 3 vessel arch anatomy. No evidence of dissection. The heart is normal in size. Unremarkable main pulmonary artery. Non Vascular No focal signal abnormality or abnormal enhancement within the lungs, pleura, mediastinum or musculoskeletal structures. No pleural effusion. MRA ABDOMEN Vascular Aorta: No evidence of aneurysm or dissection. Celiac: Widely patent. No evidence of aneurysm, dissection  or significant stenosis. SMA: Widely patent. No evidence of aneurysm, dissection or significant stenosis. Renals: Solitary renal arteries without evidence of aneurysm, dissection, significant stenosis or FMD. IMA: Patent without evidence of stenosis, dissection or aneurysm. Veins: No focal abnormalities. Non Vascular Multiple small circumscribed T2 hyperintense T1 hypointense nonenhancing lesions scattered throughout the liver consistent with small simple cysts. Stable 2.2 cm left adrenal adenoma dating back to November of 2018. Dextroconvex scoliosis of the lumbar spine with multilevel degenerative disc disease. Otherwise, no focal signal abnormality or abnormal enhancement involving the spleen, pancreas, kidneys, stomach, bowel, or lower genitourinary tract. MRA PELVIS Vascular Inflow: Tortuous with but patent. No evidence of aneurysm, dissection, stenosis or occlusion. Proximal Outflow: No evidence of aneurysm, dissection, stenosis or occlusion. Atherosclerotic plaque visualized along the posterior walls of the common femoral arteries bilaterally. Veins: Patent.  No focal abnormality. Non Vascular No signal abnormality or abnormal enhancement. IMPRESSION: 1. Essentially stable mild aneurysmal dilation  of the ascending thoracic aorta with a maximal diameter of 4.1 cm. Four year stability is reassuring. This aortic diameter may be normal for the patient's body surface area. Consider follow-up surveillance imaging every other year. 2. Stable ectasia of the aortic root at 4.4 cm. 3. The distal descending thoracic aorta and upper abdominal aorta are tortuous but nonaneurysmal. 4. No evidence of abdominal aortic, iliac or visceral artery aneurysm. 5. Mild atherosclerotic plaque in the aorta and bilateral common femoral arteries without significant stenosis. 6. Dextroconvex scoliosis of the lumbar spine with associated multilevel degenerative disc disease. 7. Small benign hepatic cysts. 8. Stable left adrenal adenoma.  Signed, Sterling Big, MD, RPVI [facility] Radiology Electronically Signed   By: Malachy Moan M.D.   On: 08/27/2021 13:24    Assessment & Plan:   CAD in native artery -     Lipid panel; Future  Dyslipidemia, goal LDL below 70 - LDL goal achieved. Doing well on the statin  -     Lipid panel; Future -     TSH; Future  Acquired hypothyroidism- He is euthyroid. -     TSH; Future  Bradycardia, drug induced- He is asymptomatic with this. -     TSH; Future     Follow-up: Return in about 6 months (around 12/10/2023).  Sanda Linger, MD

## 2023-06-12 NOTE — Patient Instructions (Signed)

## 2023-06-13 ENCOUNTER — Encounter: Payer: Self-pay | Admitting: Internal Medicine

## 2023-06-14 DIAGNOSIS — Z23 Encounter for immunization: Secondary | ICD-10-CM | POA: Diagnosis not present

## 2023-06-16 ENCOUNTER — Ambulatory Visit (INDEPENDENT_AMBULATORY_CARE_PROVIDER_SITE_OTHER): Payer: Medicare Other | Admitting: Primary Care

## 2023-06-16 ENCOUNTER — Other Ambulatory Visit: Payer: Self-pay | Admitting: Internal Medicine

## 2023-06-16 ENCOUNTER — Encounter: Payer: Self-pay | Admitting: Primary Care

## 2023-06-16 DIAGNOSIS — G4730 Sleep apnea, unspecified: Secondary | ICD-10-CM

## 2023-06-16 DIAGNOSIS — N401 Enlarged prostate with lower urinary tract symptoms: Secondary | ICD-10-CM

## 2023-06-16 NOTE — Patient Instructions (Addendum)
Sleep study showed severe OSA  Recommend starting CPAP due to severity of your OSA- this is the goal standard treatment for sleep apnea  Other options is oral appliance or positional sleep with wedge pillow (30 degrees)- these will likely not be as effective but can be tried   Follow-up: 3 months with [facility] NP or sooner if needed   CPAP and BIPAP Information CPAP and BIPAP are methods that use air pressure to keep your airways open and to help you breathe well. CPAP and BIPAP use different amounts of pressure. Your health care provider will tell you whether CPAP or BIPAP would be more helpful for you. CPAP stands for "continuous positive airway pressure." With CPAP, the amount of pressure stays the same while you breathe in (inhale) and out (exhale). BIPAP stands for "bi-level positive airway pressure." With BIPAP, the amount of pressure will be higher when you inhale and lower when you exhale. This allows you to take larger breaths. CPAP or BIPAP may be used in the hospital, or your health care provider may want you to use it at home. You may need to have a sleep study before your health care provider can order a machine for you to use at home. What are the advantages? CPAP or BIPAP can be helpful if you have: Sleep apnea. Chronic obstructive pulmonary disease (COPD). Heart failure. Medical conditions that cause muscle weakness, including muscular dystrophy or amyotrophic lateral sclerosis (ALS). Other problems that cause breathing to be shallow, weak, abnormal, or difficult. CPAP and BIPAP are most commonly used for obstructive sleep apnea (OSA) to keep the airways from collapsing when the muscles relax during sleep. What are the risks? Generally, this is a safe treatment. However, problems may occur, including: Irritated skin or skin sores if the mask does not fit properly. Dry or stuffy nose or nosebleeds. Dry mouth. Feeling gassy or bloated. Sinus or lung infection if the equipment is  not cleaned properly. When should CPAP or BIPAP be used? In most cases, the mask only needs to be worn during sleep. Generally, the mask needs to be worn throughout the night and during any daytime naps. People with certain medical conditions may also need to wear the mask at other times, such as when they are awake. Follow instructions from your health care provider about when to use the machine. What happens during CPAP or BIPAP?  Both CPAP and BIPAP are provided by a small machine with a flexible plastic tube that attaches to a plastic mask that you wear. Air is blown through the mask into your nose or mouth. The amount of pressure that is used to blow the air can be adjusted on the machine. Your health care provider will set the pressure setting and help you find the best mask for you. Tips for using the mask Because the mask needs to be snug, some people feel trapped or closed-in (claustrophobic) when first using the mask. If you feel this way, you may need to get used to the mask. One way to do this is to hold the mask loosely over your nose or mouth and then gradually apply the mask more snugly. You can also gradually increase the amount of time that you use the mask. Masks are available in various types and sizes. If your mask does not fit well, talk with your health care provider about getting a different one. Some common types of masks include: Full face masks, which fit over the mouth and nose. Nasal masks,  which fit over the nose. Nasal pillow or prong masks, which fit into the nostrils. If you are using a mask that fits over your nose and you tend to breathe through your mouth, a chin strap may be applied to help keep your mouth closed. Use a skin barrier to protect your skin as told by your health care provider. Some CPAP and BIPAP machines have alarms that may sound if the mask comes off or develops a leak. If you have trouble with the mask, it is very important that you talk with your  health care provider about finding a way to make the mask easier to tolerate. Do not stop using the mask. There could be a negative impact on your health if you stop using the mask. Tips for using the machine Place your CPAP or BIPAP machine on a secure table or stand near an electrical outlet. Know where the on/off switch is on the machine. Follow instructions from your health care provider about how to set the pressure on your machine and when you should use it. Do not eat or drink while the CPAP or BIPAP machine is on. Food or fluids could get pushed into your lungs by the pressure of the CPAP or BIPAP. For home use, CPAP and BIPAP machines can be rented or purchased through home health care companies. Many different brands of machines are available. Renting a machine before purchasing may help you find out which particular machine works well for you. Your health insurance company may also decide which machine you may get. Keep the CPAP or BIPAP machine and attachments clean. Ask your health care provider for specific instructions. Check the humidifier if you have a dry stuffy nose or nosebleeds. Make sure it is working correctly. Follow these instructions at home: Take over-the-counter and prescription medicines only as told by your health care provider. Ask if you can take sinus medicine if your sinuses are blocked. Do not use any products that contain nicotine or tobacco. These products include cigarettes, chewing tobacco, and vaping devices, such as e-cigarettes. If you need help quitting, ask your health care provider. Keep all follow-up visits. This is important. Contact a health care provider if: You have redness or pressure sores on your head, face, mouth, or nose from the mask or head gear. You have trouble using the CPAP or BIPAP machine. You cannot tolerate wearing the CPAP or BIPAP mask. Someone tells you that you snore even when wearing your CPAP or BIPAP. Get help right away  if: You have trouble breathing. You feel confused. Summary CPAP and BIPAP are methods that use air pressure to keep your airways open and to help you breathe well. If you have trouble with the mask, it is very important that you talk with your health care provider about finding a way to make the mask easier to tolerate. Do not stop using the mask. There could be a negative impact to your health if you stop using the mask. Follow instructions from your health care provider about when to use the machine. This information is not intended to replace advice given to you by your health care provider. Make sure you discuss any questions you have with your health care provider. Document Revised: 04/21/2021 Document Reviewed: 08/21/2020 Elsevier Patient Education  2023 [employer].

## 2023-06-21 ENCOUNTER — Telehealth (HOSPITAL_BASED_OUTPATIENT_CLINIC_OR_DEPARTMENT_OTHER): Payer: Self-pay | Admitting: *Deleted

## 2023-06-21 DIAGNOSIS — K644 Residual hemorrhoidal skin tags: Secondary | ICD-10-CM | POA: Diagnosis not present

## 2023-06-21 NOTE — Telephone Encounter (Signed)
     Primary Cardiologist: Verne Carrow, MD  Chart reviewed as part of pre-operative protocol coverage. Given past medical history and time since last visit, based on ACC/AHA guidelines, Gregory Hale would be at acceptable risk for the planned procedure without further cardiovascular testing.   His aspirin my be held for 5-7 day prior to his surgery. Please resume as soon as hemostasis is achieved.   I will route this recommendation to the requesting party via Epic fax function and remove from pre-op pool.  Please call with questions.  Thomasene Ripple. Amauria Younts NP-C     06/21/2023, 12:58 PM [facility] [address] Office [phone] Fax [phone]

## 2023-06-21 NOTE — Telephone Encounter (Signed)
Pre-operative Risk Assessment    Patient Name: Gregory Hale  DOB: March 13, 1951 MRN: [id number]      Request for Surgical Clearance    Procedure:   External Hemorrhoidectomy Surgery  Date of Surgery:  Clearance TBD                                 Surgeon:  Dr. Violeta Gelinas Surgeon's Group or Practice Name:  [facility] Phone number:  [phone] Fax number:  [phone]   Type of Clearance Requested:   - Medical  - Pharmacy:  Hold Aspirin Not Indicated.    Type of Anesthesia:  General    Additional requests/questions:    Signed, Emmit Pomfret   06/21/2023, 12:46 PM

## 2023-06-25 ENCOUNTER — Telehealth: Payer: Self-pay | Admitting: Primary Care

## 2023-06-25 DIAGNOSIS — G4730 Sleep apnea, unspecified: Secondary | ICD-10-CM | POA: Insufficient documentation

## 2023-06-25 NOTE — Telephone Encounter (Signed)
Can we please call patient and follow-up on his decision on whether or not he would like to pursue CPAP.  We discussed how CPAP was optimal treatment for severe sleep apnea, I believe he was going to consider and get back to [place].

## 2023-06-25 NOTE — Assessment & Plan Note (Addendum)
-   Referred by cardiology due to asymptomatic bradycardia.  Patient has snoring symptoms but otherwise feels he sleeps well.  Home sleep study on 05/13/2023 showed severe obstructive sleep apnea, AHI 55 an hour with SpO2 low 73%.  Patient spent 36 minutes with oxygen level less than 88%.  We reviewed results and treatment options.  Recommend patient be started on auto CPAP 5 to 20 cm H2O due to bradycardia. Other treatment options include oral appliance or positional sleep therapy.  Advised patient CPAP was gold standard treatment for sleep apnea and likely other treatment options would not be as effective.  He is going to consider treatment and notify office if he would like to begin CPAP.  Follow-up in 3 months to monitor symptoms.

## 2023-06-27 NOTE — Telephone Encounter (Signed)
Called patient.  He does want to start CPAP therapy.  He is going on a trip soon and would like to start on the CPAP at the end of October 2024 (after his trip).  Informed patient to call our clinic when he is ready to start treatment.  Per office visit note on 06/16/2023:  Recommended patient be started on auto CPAP 5-20cm h20 due to bradycardia. Follow-up in 3 months to monitor symptoms.   Will wait to put order in when patient calls clinic.

## 2023-07-03 ENCOUNTER — Encounter: Payer: Self-pay | Admitting: Internal Medicine

## 2023-07-03 ENCOUNTER — Other Ambulatory Visit: Payer: Self-pay | Admitting: Internal Medicine

## 2023-07-03 DIAGNOSIS — I2510 Atherosclerotic heart disease of native coronary artery without angina pectoris: Secondary | ICD-10-CM

## 2023-07-03 DIAGNOSIS — G8929 Other chronic pain: Secondary | ICD-10-CM

## 2023-07-03 DIAGNOSIS — M150 Primary generalized (osteo)arthritis: Secondary | ICD-10-CM

## 2023-07-03 DIAGNOSIS — I10 Essential (primary) hypertension: Secondary | ICD-10-CM

## 2023-07-03 DIAGNOSIS — E785 Hyperlipidemia, unspecified: Secondary | ICD-10-CM

## 2023-07-03 MED ORDER — ATORVASTATIN CALCIUM 80 MG PO TABS
80.0000 mg | ORAL_TABLET | Freq: Every evening | ORAL | 1 refills | Status: DC
Start: 2023-07-03 — End: 2023-12-28

## 2023-07-13 ENCOUNTER — Ambulatory Visit: Payer: Medicare Other | Admitting: Physician Assistant

## 2023-07-17 ENCOUNTER — Encounter: Payer: Self-pay | Admitting: Physician Assistant

## 2023-07-17 ENCOUNTER — Ambulatory Visit (INDEPENDENT_AMBULATORY_CARE_PROVIDER_SITE_OTHER): Payer: Medicare Other | Admitting: Physician Assistant

## 2023-07-17 VITALS — BP 120/62 | HR 56 | Ht 69.0 in | Wt 190.0 lb

## 2023-07-17 DIAGNOSIS — R1013 Epigastric pain: Secondary | ICD-10-CM

## 2023-07-17 DIAGNOSIS — K5900 Constipation, unspecified: Secondary | ICD-10-CM | POA: Diagnosis not present

## 2023-07-17 DIAGNOSIS — Z860101 Personal history of adenomatous and serrated colon polyps: Secondary | ICD-10-CM

## 2023-07-17 MED ORDER — CLENPIQ 10-3.5-12 MG-GM -GM/175ML PO SOLN: 1.0000 | ORAL | 0 refills | Status: DC

## 2023-07-17 NOTE — Patient Instructions (Addendum)
You have been scheduled for a colonoscopy. Please follow written instructions given to you at your visit today.   Please pick up your prep supplies at the pharmacy within the next 1-3 days.  If you use inhalers (even only as needed), please bring them with you on the day of your procedure.  DO NOT TAKE 7 DAYS PRIOR TO TEST- Trulicity (dulaglutide) Ozempic, Wegovy (semaglutide) Mounjaro (tirzepatide) Bydureon Bcise (exanatide extended release)  DO NOT TAKE 1 DAY PRIOR TO YOUR TEST Rybelsus (semaglutide) Adlyxin (lixisenatide) Victoza (liraglutide) Byetta (exanatide) ___________________________________________________________________________   Titrate your miralax as needed.  Try slowly stopping the pepcid.  I appreciate the opportunity to care for you. Hyacinth Meeker PA-C

## 2023-07-17 NOTE — Progress Notes (Signed)
Chief Complaint: Epigastric pain and constipation  HPI:    Gregory Hale is a 72 year old male with a past medical history as listed below including STEMI in 2018 (04/25/2023 echo with LVEF 60-65%), known to Dr. Russella Dar, who was referred to me by Etta Grandchild, MD for a complaint of epigastric pain and constipation.    08/10/2020 colonoscopy with nine 5-7 mm polyps in the rectum, sigmoid colon, transverse colon removed in 2 to 3-4 mm polyps in the ascending colon, mild diverticulosis in the left, anal papula were hypertrophied and internal hemorrhoids.  Recommended a more extensive prep next time.  Pathology showed tubular adenomas and repeat recommended in 3 years.    Today, patient presents to clinic and explains that he was having some epigastric pain back in the summer and started an over-the-counter Pepcid he thinks 10 mg nightly and this completely relieved his symptoms.  His question now is whether or not to continue this medication.    Also discusses that he has constipation which is relieved, almost to the other extreme with MiraLAX a cap and a half daily.  Now he is having 3 bowel movements a day which are somewhat urgent and wonders what he should do next.  Apparently had tried fiber supplement in the past which caused him excessive fullness and bloating.    Patient is having surgery for his external hemorrhoids next month.    Denies fever, chills or weight loss.  Past Medical History:  Diagnosis Date   Arthritis    Ascending aorta dilatation (HCC)    Bradycardia    a. HR 40 in setting of acute inferior STEMI 03/2017.   CAD in native artery    a. inf STEMI 03/2017 a/w hypotension and bradycardia -> s/p DES to RCA, otherwise mild disease in LAD, moderate disease in ostium of the moderate caliber diagonal branch, normal LVEF >65%   Dilated aortic root (HCC)    Heart attack (HCC)    STEMI involving right coronary artery (HCC)    04/22/17 PCI/DES to mRCA, normal EF   Tobacco abuse    Quit  in 1998    Past Surgical History:  Procedure Laterality Date   CORONARY STENT INTERVENTION N/A 04/22/2017   Procedure: Coronary Stent Intervention;  Surgeon: Kathleene Hazel, MD;  Location: [facility] INVASIVE CV LAB;  Service: Cardiovascular;  Laterality: N/A;   LEFT HEART CATH AND CORONARY ANGIOGRAPHY N/A 04/22/2017   Procedure: Left Heart Cath and Coronary Angiography;  Surgeon: Kathleene Hazel, MD;  Location: [facility] INVASIVE CV LAB;  Service: Cardiovascular;  Laterality: N/A;   parotid Left 1980   TONSILLECTOMY     as a child    Current Outpatient Medications  Medication Sig Dispense Refill   acetaminophen (TYLENOL) 500 MG tablet Take 500 mg by mouth every 6 (six) hours as needed.     amLODipine (NORVASC) 5 MG tablet TAKE 1 TABLET BY MOUTH DAILY 90 tablet 0   aspirin EC 81 MG tablet Take 1 tablet (81 mg total) by mouth daily. Swallow whole. 90 tablet 3   atorvastatin (LIPITOR) 80 MG tablet Take 1 tablet (80 mg total) by mouth every evening. 90 tablet 1   celecoxib (CELEBREX) 100 MG capsule Take 1 capsule (100 mg total) by mouth 2 (two) times daily. 180 capsule 0   famotidine (PEPCID) 10 MG tablet Take 10 mg by mouth daily.     irbesartan (AVAPRO) 300 MG tablet Take 1 tablet (300 mg total) by mouth daily. 90 tablet  1   metoprolol succinate (TOPROL-XL) 25 MG 24 hr tablet Take 6.25 mg by mouth daily.     nitroGLYCERIN (NITROSTAT) 0.4 MG SL tablet Place 1 tablet (0.4 mg total) under the tongue every 5 (five) minutes as needed. 25 tablet 3   No current facility-administered medications for this visit.    Allergies as of 07/17/2023 - Review Complete 07/17/2023  Allergen Reaction Noted   Advil [ibuprofen] Other (See Comments) 12/12/2018    Family History  Problem Relation Age of Onset   CAD Brother    Diabetes Brother    Rheumatic fever Father    Other Father        rheumatic heart   Pancreatic cancer Sister    Colon cancer Neg Hx    Esophageal cancer Neg Hx    Stomach  cancer Neg Hx    Rectal cancer Neg Hx     Social History   Socioeconomic History   Marital status: Married    Spouse name: Not on file   Number of children: 1   Years of education: Not on file   Highest education level: Bachelor's degree (e.g., BA, AB, BS)  Occupational History   Occupation: retired  Tobacco Use   Smoking status: Former    Current packs/day: 0.00    Types: Cigarettes    Quit date: 1990    Years since quitting: 34.8   Smokeless tobacco: Never  Vaping Use   Vaping status: Never Used  Substance and Sexual Activity   Alcohol use: Yes    Alcohol/week: 10.0 standard drinks of alcohol    Types: 10 Cans of beer per week    Comment: .5 per day   Drug use: No   Sexual activity: Yes    Partners: Female  Other Topics Concern   Not on file  Social History Narrative   Not on file   Social Determinants of Health   Financial Resource Strain: Low Risk  (02/05/2023)   Overall Financial Resource Strain (CARDIA)    Difficulty of Paying Living Expenses: Not hard at all  Food Insecurity: No Food Insecurity (02/05/2023)   Hunger Vital Sign    Worried About Running Out of Food in the Last Year: Never true    Ran Out of Food in the Last Year: Never true  Transportation Needs: No Transportation Needs (02/05/2023)   PRAPARE - Administrator, Civil Service (Medical): No    Lack of Transportation (Non-Medical): No  Physical Activity: Sufficiently Active (02/05/2023)   Exercise Vital Sign    Days of Exercise per Week: 6 days    Minutes of Exercise per Session: 150+ min  Stress: No Stress Concern Present (02/05/2023)   [employer] of Occupational Health - Occupational Stress Questionnaire    Feeling of Stress : Only a little  Social Connections: Socially Integrated (02/05/2023)   Social Connection and Isolation Panel [NHANES]    Frequency of Communication with Friends and Family: More than three times a week    Frequency of Social Gatherings with Friends and  Family: More than three times a week    Attends Religious Services: 1 to 4 times per year    Active Member of [employer] or Organizations: Yes    Attends Engineer, structural: More than 4 times per year    Marital Status: Married  Catering manager Violence: Not on file    Review of Systems:    Constitutional: No weight loss, fever or chills Skin: No rash  Cardiovascular:  No chest pain Respiratory: No SOB  Gastrointestinal: See HPI and otherwise negative Genitourinary: No dysuria Neurological: No headache, dizziness or syncope Musculoskeletal: No new muscle or joint pain Hematologic: No bleeding  Psychiatric: No history of depression or anxiety   Physical Exam:  Vital signs: BP 120/62   Pulse (!) 56   Ht 5\' 9"  (1.753 m)   Wt 190 lb (86.2 kg)   BMI 28.06 kg/m   Constitutional:   Pleasant Caucasian male appears to be in NAD, Well developed, Well nourished, alert and cooperative Head:  Normocephalic and atraumatic. Eyes:   PEERL, EOMI. No icterus. Conjunctiva pink. Ears:  Normal auditory acuity. Neck:  Supple Throat: Oral cavity and pharynx without inflammation, swelling or lesion.  Respiratory: Respirations even and unlabored. Lungs clear to auscultation bilaterally.   No wheezes, crackles, or rhonchi.  Cardiovascular: Normal S1, S2. No MRG. Regular rate and rhythm. No peripheral edema, cyanosis or pallor.  Gastrointestinal:  Soft, nondistended, nontender. No rebound or guarding. Normal bowel sounds. No appreciable masses or hepatomegaly. Rectal:  Not performed.  Msk:  Symmetrical without gross deformities. Without edema, no deformity or joint abnormality.  Neurologic:  Alert and  oriented x4;  grossly normal neurologically.  Skin:   Dry and intact without significant lesions or rashes. Psychiatric: Demonstrates good judgement and reason without abnormal affect or behaviors.  RELEVANT LABS AND IMAGING: CBC    Component Value Date/Time   WBC 7.3 03/16/2023 0945   RBC  4.68 03/16/2023 0945   HGB 14.6 03/16/2023 0945   HCT 44.6 03/16/2023 0945   HCT 46 06/24/2011 0000   PLT 176.0 03/16/2023 0945   PLT 154 06/24/2011 0000   MCV 95.3 03/16/2023 0945   MCH 31.3 04/24/2017 0322   MCHC 32.8 03/16/2023 0945   RDW 14.3 03/16/2023 0945   RDW 13.4 06/24/2011 0000   LYMPHSABS 1.7 03/16/2023 0945   MONOABS 0.7 03/16/2023 0945   EOSABS 0.1 03/16/2023 0945   BASOSABS 0.0 03/16/2023 0945    CMP     Component Value Date/Time   NA 138 03/16/2023 0945   NA 139 08/09/2021 1046   K 5.1 03/16/2023 0945   K 5.2 06/24/2011 0000   CL 103 03/16/2023 0945   CL 103 06/24/2011 0000   CO2 28 03/16/2023 0945   GLUCOSE 110 (H) 03/16/2023 0945   BUN 19 03/16/2023 0945   BUN 12 08/09/2021 1046   CREATININE 1.00 03/16/2023 0945   CREATININE 1.08 06/24/2011 0000   CALCIUM 9.2 03/16/2023 0945   CALCIUM 9.5 06/24/2011 0000   PROT 7.2 03/16/2023 0945   PROT 6.2 05/22/2017 0814   ALBUMIN 4.3 03/16/2023 0945   ALBUMIN 4.0 05/22/2017 0814   AST 20 03/16/2023 0945   AST 17 06/24/2011 0000   ALT 15 03/16/2023 0945   ALKPHOS 73 03/16/2023 0945   ALKPHOS 64 06/24/2011 0000   BILITOT 1.3 (H) 03/16/2023 0945   BILITOT 1.4 (H) 05/22/2017 0814   BILITOT 1.3 06/24/2011 0000   GFRNONAA 76 07/20/2020 0938   GFRAA 88 07/20/2020 0938    Assessment: 1.  History of adenomatous polyps: Patient due for his next colonoscopy in November 2.  Constipation: Better on MiraLAX And a half a day, now with sometimes 3 urgent bowel movements a day 3.  Epigastric pain: Better after 3 months of Pepcid 10 mg nightly; likely gastritis  Plan: 1.  Scheduled patient for a colonoscopy with Dr. Barron Alvine in lieu of Dr. Anselm Jungling retirement and to give the patient some time  after his upcoming external hemorrhoid surgery for his history of adenomatous polyps.  Did provide the patient a detailed list of risks for the procedure and he agrees to proceed. Patient is appropriate for endoscopic procedure(s) in  the ambulatory ([facility]) setting.  2.  Patient will have a 2-day bowel prep for the above procedure. 3.  Discussed titration of MiraLAX for switching to a fiber supplement such as Citrucel which is slightly less gas causing. 4.  Discussed with patient he can trial coming off of the Pepcid, would go to every other night and then stop, if he continues to require this medication he will let us know. 5.  Patient to follow in clinic per recommendations from Dr. Barron Alvine after time of procedure.  He will be the patient's primary GI physician here.  Hyacinth Meeker, PA-C [facility] 07/17/2023, 8:48 AM  Cc: Etta Grandchild, MD

## 2023-07-19 ENCOUNTER — Encounter (HOSPITAL_BASED_OUTPATIENT_CLINIC_OR_DEPARTMENT_OTHER): Payer: Self-pay | Admitting: General Surgery

## 2023-07-20 NOTE — Progress Notes (Addendum)
Reviewed chart with Dr Isaias Cowman at [facility]. Due to patient's complete heart block (although stable but no PPM) patient is not a [facility] candidate and will need to have procedure at main OR. Dr Carollee Massed office aware.

## 2023-07-24 NOTE — Progress Notes (Signed)
Agree with the assessment and plan as outlined by Jennifer Lemmon, PA-C. ? ?Jaja Switalski, DO, FACG ? ?

## 2023-07-25 NOTE — Progress Notes (Signed)
Called and left a VM with Dois Davenport the surgery scheduler for Dr.Thompson ensuring that the office is aware that this pt is NOT a candidate for outpatient surgery at [facility] and his surgery will need to done in the main OR. At this time the case is still posted at [facility].

## 2023-07-26 ENCOUNTER — Other Ambulatory Visit: Payer: Self-pay

## 2023-07-26 ENCOUNTER — Encounter (HOSPITAL_COMMUNITY): Payer: Self-pay | Admitting: General Surgery

## 2023-07-26 NOTE — Progress Notes (Signed)
Anesthesia Chart Review: Gregory Hale  Case: [id number] Date/Time: 07/27/23 1245   Procedure: EXTERNAL HEMORRHOIDECTOMY   Anesthesia type: General   Pre-op diagnosis: EXTERNAL HEMORRHOIDS   [facility]: [facility] ROOM 04 / [facility] OR   Surgeons: Violeta Gelinas, MD       DISCUSSION: Patient is a 72 year old male scheduled for the above procedure.  Case is a same-day workup as it is a late add on after being moved from [facility] due to cardiac history including tachybradycardia syndrome with nocturnal high degree heart block on recent monitor that is being followed by EP without plans for PPM at this time.   History includes former smoker (quit 09/26/88), , CAD (inferior STEMI, s/p DES mid RCA 04/22/17), dilated aortic root/TAA (ascending TAA 44 mm 08/2021 MRA; 40 mm aortic root, 42 mm TAA 03/2023 echo), tachybradycardia syndrome, OSA (severe 05/13/23 HST), parotidectomy, tonsillectomy.   He is followed by cardiology since his STEMI in 2018. Dr. Clifton James is his primary cardiologist. In May 2024 he reported asymptomatic bradycardia and a 14 day Zio monitor was ordered which showed tachycardia bradycardia syndrome with nocturnal bradycardia and AV block (including CHB). B-blocker was held until he could see EP. He had evaluation with Dr. Lalla Brothers on 03/29/23. He reported being active and able to play golf and even walk 18 holes. 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 No syncope or presyncope. He reported snoring with prior diagnosis of OSA,but was not using CPAP due to mask issues. He suspected nocturnal bradycardia and AV block is secondary to hyper vagotonia. Treatment options of watchful waiting versus PPM discussed, and wrote, "Given his chronotropic response, the patient and I mutually decided to continue with watchful waiting approach.  He will continue low-dose metoprolol 6.25 mg by mouth once daily." Six month EP follow-up is planned. He was also referred for repeat sleep study which was  done on 05/13/23 showing severe OSA with AHI 55.8/hr. CPAP has been recommended, but as of 06/25/23 he was still considering.   Preoperative cardiology input outlined by Edd Fabian, NP on 06/21/23. "Given past medical history and time since last visit, based on ACC/AHA guidelines, [name] would be at acceptable risk for the planned procedure without further cardiovascular testing.    His aspirin my be held for 5-7 day prior to his surgery. Please resume as soon as hemostasis is achieved."  Anesthesia team to evaluate on the day of surgery. Severe OSA with O2 saturation to nadir of 73% and 88% or less for 36 minutes by 04/2023 sleep study    VS: Ht 5\' 9"  (1.753 m)   Wt 86.2 kg   BMI 28.06 kg/m   BP Readings from Last 3 Encounters:  07/17/23 120/62  06/16/23 110/74  06/12/23 136/78   Pulse Readings from Last 3 Encounters:  07/17/23 (!) 56  06/16/23 (!) 52  06/12/23 63    PROVIDERS: Etta Grandchild, MD is PCP  Verne Carrow, MD is cardiologist Steffanie Dunn, MD is EP cardiologist Coralyn Helling, MD is pulmonologist (OSA) Doristine Locks, DO is GI   LABS: For day of surgery as indicated. Last results in [facility] include: Lab Results  Component Value Date   WBC 7.3 03/16/2023   HGB 14.6 03/16/2023   HCT 44.6 03/16/2023   PLT 176.0 03/16/2023   GLUCOSE 110 (H) 03/16/2023   ALT 15 03/16/2023   AST 20 03/16/2023   NA 138 03/16/2023   K 5.1 03/16/2023   CL 103 03/16/2023  CREATININE 1.00 03/16/2023   BUN 19 03/16/2023   CO2 28 03/16/2023   TSH 2.93 06/12/2023    Home Sleep Study 05/13/23: Interpretation: Patient demonstrated a severe level of abnormal breathing events with clinically significant levels of oxygen desaturation.  Severe obstructive sleep apnea, AHI (4%) 55.8/h.  Snoring with oxygen desaturation to a nadir of 73% and time with O2 saturation 88% or less was 36 minutes. - Pulmonology recommended he start auto CPAP 5 to 20 cm H2O and wear nightly for  4-6 hours or longer.   IMAGES: MRA Chest/abd/pelvis 08/27/21: IMPRESSION: 1. Essentially stable mild aneurysmal dilation of the ascending thoracic aorta with a maximal diameter of 4.1 cm. Four year stability is reassuring. This aortic diameter may be normal for the patient's body surface area. Consider follow-up surveillance imaging every other year. 2. Stable ectasia of the aortic root at 4.4 cm. 3. The distal descending thoracic aorta and upper abdominal aorta are tortuous but nonaneurysmal. 4. No evidence of abdominal aortic, iliac or visceral artery aneurysm. 5. Mild atherosclerotic plaque in the aorta and bilateral common femoral arteries without significant stenosis. 6. Dextroconvex scoliosis of the lumbar spine with associated multilevel degenerative disc disease. 7. Small benign hepatic cysts. 8. Stable left adrenal adenoma.    EKG: 02/06/23: SB at 46 bpm   CV: Long term cardiac monitor 02/23/23 - 03/08/23: Patch Wear Time:  8 days and 22 hours (2024-05-30T15:38:47-0400 to 2024-06-12T13:49:53-0400)   Monitor 1 Patient had a min HR of 23 bpm, max HR of 133 bpm, and avg HR of 60 bpm. Predominant underlying rhythm was Sinus Rhythm. 74 Supraventricular Tachycardia runs occurred, the run with the fastest interval lasting 9 beats with a max rate of 133 bpm, the  longest lasting 50.8 secs with an avg rate of 116 bpm. 2 episode(s) of AV Block (High Grade) occurred, lasting a total of 10 secs. Second Degree AV Block-Mobitz I (Wenckebach) was present. Isolated SVEs were occasional (2.3%, 5866), SVE Couplets were  rare (<1.0%, 535), and SVE Triplets were rare (<1.0%, 99). Isolated VEs were occasional (1.1%, 2840), VE Couplets were rare (<1.0%, 53), and no VE Triplets were present. Ventricular Bigeminy was present. Difficulty discerning atrial activity making  definitive diagnosis difficult to ascertain.   Monitor 2 Patient had a min HR of 25 bpm, max HR of 167 bpm, and avg HR of 58  bpm. Predominant underlying rhythm was Sinus Rhythm. Bundle Branch Block/IVCD was present. 1 run of Ventricular Tachycardia occurred lasting 4 beats with a max rate of 167 bpm (avg 164  bpm). 296 Supraventricular Tachycardia runs occurred, the run with the fastest interval lasting 14 beats with a max rate of 154 bpm, the longest lasting 3 mins 4 secs with an avg rate of 116 bpm. Some episodes of Supraventricular Tachycardia may be  possible Atrial Tachycardia with variable block. 4 episode(s) of AV Block (High Grade and 3rd) occurred, lasting a total of 21 secs. Second Degree AV Block-Mobitz I (Wenckebach) was present. Isolated SVEs were occasional (1.9%, 9216), SVE Couplets were  rare (<1.0%, 1022), and SVE Triplets were rare (<1.0%, 234). Isolated VEs were occasional (1.8%, 8877), VE Couplets were rare (<1.0%, 245), and no VE Triplets were present. Ventricular Bigeminy and Trigeminy were present. Difficulty discerning atrial  activity during AV Block episode(s) making definitive diagnosis difficult to ascertain. MD notification criteria for Complete Heart Block met - report posted prior to notification per account request (CE).  - B-blocker discontinued and referred to EP for further evaluation.  Echo 04/25/23: IMPRESSIONS   1. Left ventricular ejection fraction, by estimation, is 60 to 65%. The  left ventricle has normal function. The left ventricle has no regional  wall motion abnormalities. Left ventricular diastolic parameters are  consistent with Grade I diastolic  dysfunction (impaired relaxation).   2. Right ventricular systolic function is normal. The right ventricular  size is normal. There is normal pulmonary artery systolic pressure. The  estimated right ventricular systolic pressure is 28.8 mmHg.   3. The mitral valve is grossly normal. Trivial mitral valve  regurgitation. No evidence of mitral stenosis.   4. The aortic valve is tricuspid. Aortic valve regurgitation is trivial.   No aortic stenosis is present.   5. Aortic dilatation noted. There is mild dilatation of the aortic root,  measuring 40 mm. There is mild dilatation of the ascending aorta,  measuring 42 mm.   6. The inferior vena cava is normal in size with greater than 50%  respiratory variability, suggesting right atrial pressure of 3 mmHg.    Cardiac cath/PCI 04/22/17: Mid LAD lesion, 30 %stenosed. Ost 2nd Diag lesion, 70 %stenosed. A STENT RESOLUTE ONYX [id number] drug eluting stent was successfully placed. Prox RCA lesion, 100 %stenosed. Post intervention, there is a 0% residual stenosis. The left ventricular systolic function is normal. LV end diastolic pressure is normal. The left ventricular ejection fraction is greater than 65% by visual estimate. There is no mitral valve regurgitation.   1. Acute inferior STEMI secondary to thrombotic occlusion of the mid RCA 2. Successful PTCA/DES x 1 mid RCA 3. Mild disease in the mid LAD 4. Moderate disease in the ostium of the moderate caliber Diagonal branch 5. Normal LV systolic function   Recommendations: Will admit to ICU on telemetry. Will continue Cangrelor for 30 minutes post Brilinta. Will then use ASA, Brilinta for one year. Will start high dose statin and low dose beta blocker. Echo tomorrow.   Past Medical History:  Diagnosis Date   Arthritis    Ascending aorta dilatation (HCC)    Bradycardia    a. HR 40 in setting of acute inferior STEMI 03/2017.   CAD in native artery    a. inf STEMI 03/2017 a/w hypotension and bradycardia -> s/p DES to RCA, otherwise mild disease in LAD, moderate disease in ostium of the moderate caliber diagonal branch, normal LVEF >65%   Dilated aortic root (HCC)    Heart attack (HCC)    Hypertension    Sleep apnea    STEMI involving right coronary artery (HCC)    04/22/17 PCI/DES to mRCA, normal EF   Tobacco abuse    Quit in 1998    Past Surgical History:  Procedure Laterality Date   CORONARY STENT INTERVENTION  N/A 04/22/2017   Procedure: Coronary Stent Intervention;  Surgeon: Kathleene Hazel, MD;  Location[facility] INVASIVE CV LAB;  Service: Cardiovascular;  Laterality: N/A;   LEFT HEART CATH AND CORONARY ANGIOGRAPHY N/A 04/22/2017   Procedure: Left Heart Cath and Coronary Angiography;  Surgeon: Kathleene Hazel, MD;  Location: [facility] INVASIVE CV LAB;  Service: Cardiovascular;  Laterality: N/A;   parotid Left 1980   TONSILLECTOMY     as a child    MEDICATIONS: No current facility-administered medications for this encounter.    amLODipine (NORVASC) 5 MG tablet   aspirin EC 81 MG tablet   atorvastatin (LIPITOR) 80 MG tablet   celecoxib (CELEBREX) 100 MG capsule   famotidine (PEPCID) 10 MG tablet   irbesartan (AVAPRO) 300 MG  tablet   metoprolol succinate (TOPROL-XL) 25 MG 24 hr tablet   nitroGLYCERIN (NITROSTAT) 0.4 MG SL tablet   Sod Picosulfate-Mag Ox-Cit Acd (CLENPIQ) 10-3.5-12 MG-GM -GM/175ML SOLN    Shonna Chock, PA-C Surgical Short Stay/Anesthesiology [facility] Phone [phone] [facility] Phone [phone] 07/26/2023 5:07 PM

## 2023-07-26 NOTE — Anesthesia Preprocedure Evaluation (Signed)
Anesthesia Evaluation  Patient identified by MRN, date of birth, ID band Patient awake    Reviewed: Allergy & Precautions, NPO status , Patient's Chart, lab work & pertinent test results, reviewed documented beta blocker date and time   Airway Mallampati: II  TM Distance: >3 FB     Dental no notable dental hx. (+) Dental Advisory Given   Pulmonary sleep apnea and Continuous Positive Airway Pressure Ventilation , former smoker   Pulmonary exam normal breath sounds clear to auscultation       Cardiovascular hypertension, Pt. on medications + CAD, + Past MI and + Cardiac Stents   Rhythm:Regular Rate:Bradycardia  Dilated aortic root and ascending aorta  Inf STEMI 03/2017 a/w hypotension and bradycardia -> s/p DES to RCA, otherwise mild disease in LAD, moderate disease in ostium of the moderate caliber diagonal branch, normal LVEF >65%   Neuro/Psych negative neurological ROS  negative psych ROS   GI/Hepatic Neg liver ROS,GERD  ,,External hemorrhoids   Endo/Other  Hypothyroidism  Hyperlipidemia  Renal/GU negative Renal ROS  negative genitourinary   Musculoskeletal  (+) Arthritis , Osteoarthritis,    Abdominal Normal abdominal exam  (+)   Peds  Hematology negative hematology ROS (+)   Anesthesia Other Findings   Reproductive/Obstetrics                              Anesthesia Physical Anesthesia Plan  ASA: 3  Anesthesia Plan: General   Post-op Pain Management: Minimal or no pain anticipated and Precedex   Induction: Intravenous  PONV Risk Score and Plan: 3 and Treatment may vary due to age or medical condition, Dexamethasone and Ondansetron  Airway Management Planned: Oral ETT  Additional Equipment: None  Intra-op Plan:   Post-operative Plan: Extubation in OR  Informed Consent: I have reviewed the patients History and Physical, chart, labs and discussed the procedure including the  risks, benefits and alternatives for the proposed anesthesia with the patient or authorized representative who has indicated his/her understanding and acceptance.     Dental advisory given  Plan Discussed with: CRNA and Anesthesiologist  Anesthesia Plan Comments: (See PAT note written 07/26/2023 by Shonna Chock, PA-C. Inferior STEMI 2018, s/p DES RCA. Bradycardia with nocturnal high degree AV block, watchful waiting per EP. Diagnosed with severe OSA following 04/2023 HST.  Preoperative cardiology input outlined by Edd Fabian, NP on 06/21/23. "Given past medical history and time since last visit, based on ACC/AHA guidelines, Lamell Bohlander would be at acceptable risk for the planned procedure without further cardiovascular testing.  His aspirin my be held for 5-7 day prior to his surgery. Please resume as soon as hemostasis is achieved."  Home Sleep Study 05/13/23: Interpretation: Patient demonstrated a severe level of abnormal breathing events with clinically significant levels of oxygen desaturation.  Severe obstructive sleep apnea, AHI (4%) 55.8/h.  Snoring with oxygen desaturation to a nadir of 73% and time with O2 saturation 88% or less was 36 minutes. )         Anesthesia Quick Evaluation

## 2023-07-26 NOTE — Progress Notes (Addendum)
PCP - Etta Grandchild, MD Cardiologist - Kathleene Hazel, MD Electrophysiology - Cardiology Lanier Prude, MD   PPM/ICD - denies Device Orders - n/a Rep Notified - n/a  Chest x-ray -  EKG - 02-06-23 Stress Test -  ECHO - 04-25-23 Cardiac Cath - 04-22-17 Home sleep study 05-13-23  CPAP - awaiting to get machine  Dm -denies  Blood Thinner Instructions: denies Aspirin Instructions: last dose 07-22-23  ERAS Protcol - clear liquids until 10:00  COVID TEST- no  Anesthesia review: yes cardiac hx.  Patient verbally denies any shortness of breath, fever, cough and chest pain during phone call   -------------  SDW INSTRUCTIONS given:  Your procedure is scheduled on OCTOBER 31,2024.  Report to [facility] Main Entrance "A" at 10:30 A.M., and check in at the Admitting office.  Call this number if you have problems the morning of surgery:  [phone]   Remember:  Do not eat after midnight the night before your surgery  You may drink clear liquids until 10:00 the morning of your surgery.   Clear liquids allowed are: Water, Non-Citrus Juices (without pulp), Carbonated Beverages, Clear Tea, Black Coffee Only, and Gatorade    Take these medicines the morning of surgery with A SIP OF WATER  amLODipine (NORVASC)  metoprolol succinate (TOPROL-XL)   IF NEEDED nitroGLYCERIN (NITROSTAT)    As of today, STOP taking any Aspirin (unless otherwise instructed by your surgeon) Aleve, Naproxen, Ibuprofen, Motrin, Advil, Goody's, BC's, all herbal medications, fish oil, and all vitamins. THIS INCLUDES YOUR celecoxib (CELEBREX).                       Do not wear jewelry, make up, or nail polish            Do not wear lotions, powders, perfumes/colognes, or deodorant.            Do not shave 48 hours prior to surgery.  Men may shave face and neck.            Do not bring valuables to the hospital.            [facility] is not responsible for any belongings or valuables.  Do NOT  Smoke (Tobacco/Vaping) 24 hours prior to your procedure If you use a CPAP at night, you may bring all equipment for your overnight stay.   Contacts, glasses, dentures or bridgework may not be worn into surgery.      For patients admitted to the hospital, discharge time will be determined by your treatment team.   Patients discharged the day of surgery will not be allowed to drive home, and someone needs to stay with them for 24 hours.    Special instructions:   [place]- Preparing For Surgery  Before surgery, you can play an important role. Because skin is not sterile, your skin needs to be as free of germs as possible. You can reduce the number of germs on your skin by washing with CHG (chlorahexidine gluconate) Soap before surgery.  CHG is an antiseptic cleaner which kills germs and bonds with the skin to continue killing germs even after washing.    Oral Hygiene is also important to reduce your risk of infection.  Remember - BRUSH YOUR TEETH THE MORNING OF SURGERY WITH YOUR REGULAR TOOTHPASTE  Please do not use if you have an allergy to CHG or antibacterial soaps. If your skin becomes reddened/irritated stop using the CHG.  Do not  shave (including legs and underarms) for at least 48 hours prior to first CHG shower. It is OK to shave your face.  Please follow these instructions carefully.   Shower the NIGHT BEFORE SURGERY and the MORNING OF SURGERY with DIAL Soap.   Pat yourself dry with a CLEAN TOWEL.  Wear CLEAN PAJAMAS to bed the night before surgery  Place CLEAN SHEETS on your bed the night of your first shower and DO NOT SLEEP WITH PETS.   Day of Surgery: Please shower morning of surgery  Wear Clean/Comfortable clothing the morning of surgery Do not apply any deodorants/lotions.   Remember to brush your teeth WITH YOUR REGULAR TOOTHPASTE.   Questions were answered. Patient verbalized understanding of instructions.

## 2023-07-27 ENCOUNTER — Other Ambulatory Visit (HOSPITAL_COMMUNITY): Payer: Self-pay

## 2023-07-27 ENCOUNTER — Ambulatory Visit (HOSPITAL_BASED_OUTPATIENT_CLINIC_OR_DEPARTMENT_OTHER): Payer: Medicare Other | Admitting: Anesthesiology

## 2023-07-27 ENCOUNTER — Other Ambulatory Visit: Payer: Self-pay

## 2023-07-27 ENCOUNTER — Ambulatory Visit (HOSPITAL_COMMUNITY)
Admission: RE | Admit: 2023-07-27 | Discharge: 2023-07-27 | Disposition: A | Discharge status: Home / Self Care | Payer: Medicare Other | Attending: General Surgery | Admitting: General Surgery

## 2023-07-27 ENCOUNTER — Encounter (HOSPITAL_COMMUNITY): Payer: Self-pay | Admitting: General Surgery

## 2023-07-27 ENCOUNTER — Ambulatory Visit (HOSPITAL_COMMUNITY): Payer: Medicare Other | Admitting: Anesthesiology

## 2023-07-27 ENCOUNTER — Encounter (HOSPITAL_COMMUNITY)
Admission: RE | Disposition: A | Discharge status: Home / Self Care | Payer: Self-pay | Source: Home / Self Care | Attending: General Surgery

## 2023-07-27 DIAGNOSIS — E039 Hypothyroidism, unspecified: Secondary | ICD-10-CM | POA: Insufficient documentation

## 2023-07-27 DIAGNOSIS — E785 Hyperlipidemia, unspecified: Secondary | ICD-10-CM | POA: Diagnosis not present

## 2023-07-27 DIAGNOSIS — I2510 Atherosclerotic heart disease of native coronary artery without angina pectoris: Secondary | ICD-10-CM | POA: Insufficient documentation

## 2023-07-27 DIAGNOSIS — K219 Gastro-esophageal reflux disease without esophagitis: Secondary | ICD-10-CM | POA: Insufficient documentation

## 2023-07-27 DIAGNOSIS — G4730 Sleep apnea, unspecified: Secondary | ICD-10-CM | POA: Diagnosis not present

## 2023-07-27 DIAGNOSIS — K644 Residual hemorrhoidal skin tags: Secondary | ICD-10-CM

## 2023-07-27 DIAGNOSIS — K648 Other hemorrhoids: Secondary | ICD-10-CM | POA: Diagnosis not present

## 2023-07-27 DIAGNOSIS — K642 Third degree hemorrhoids: Secondary | ICD-10-CM | POA: Diagnosis not present

## 2023-07-27 DIAGNOSIS — I252 Old myocardial infarction: Secondary | ICD-10-CM | POA: Diagnosis not present

## 2023-07-27 DIAGNOSIS — I10 Essential (primary) hypertension: Secondary | ICD-10-CM | POA: Diagnosis not present

## 2023-07-27 DIAGNOSIS — Z955 Presence of coronary angioplasty implant and graft: Secondary | ICD-10-CM | POA: Insufficient documentation

## 2023-07-27 DIAGNOSIS — Z01818 Encounter for other preprocedural examination: Secondary | ICD-10-CM

## 2023-07-27 HISTORY — DX: Sleep apnea, unspecified: G47.30

## 2023-07-27 HISTORY — DX: Essential (primary) hypertension: I10

## 2023-07-27 HISTORY — PX: HEMORRHOID SURGERY: SHX153

## 2023-07-27 LAB — CBC
HCT: 43.1 % (ref 39.0–52.0)
Hemoglobin: 14.1 g/dL (ref 13.0–17.0)
MCH: 30.5 pg (ref 26.0–34.0)
MCHC: 32.7 g/dL (ref 30.0–36.0)
MCV: 93.1 fL (ref 80.0–100.0)
Platelets: 179 10*3/uL (ref 150–400)
RBC: 4.63 MIL/uL (ref 4.22–5.81)
RDW: 13.1 % (ref 11.5–15.5)
WBC: 8.1 10*3/uL (ref 4.0–10.5)
nRBC: 0 % (ref 0.0–0.2)

## 2023-07-27 LAB — BASIC METABOLIC PANEL
Anion gap: 13 (ref 5–15)
BUN: 11 mg/dL (ref 8–23)
CO2: 23 mmol/L (ref 22–32)
Calcium: 9 mg/dL (ref 8.9–10.3)
Chloride: 101 mmol/L (ref 98–111)
Creatinine, Ser: 0.99 mg/dL (ref 0.61–1.24)
GFR, Estimated: >60 mL/min (ref >60)
Glucose, Bld: 99 mg/dL (ref 70–99)
Potassium: 4 mmol/L (ref 3.5–5.1)
Sodium: 137 mmol/L (ref 135–145)

## 2023-07-27 SURGERY — HEMORRHOIDECTOMY
Anesthesia: General | Site: Rectum

## 2023-07-27 MED ORDER — THROMBIN 20000 UNITS EX KIT
PACK | CUTANEOUS | Status: DC | PRN
  Administered 2023-07-27: 20 mL via TOPICAL

## 2023-07-27 MED ORDER — EPHEDRINE 5 MG/ML INJ
INTRAVENOUS | Status: AC
  Filled 2023-07-27: qty 5

## 2023-07-27 MED ORDER — MIDAZOLAM HCL 2 MG/2ML IJ SOLN
INTRAMUSCULAR | Status: DC | PRN
  Administered 2023-07-27 (×2): 1 mg via INTRAVENOUS

## 2023-07-27 MED ORDER — PROPOFOL 10 MG/ML IV BOLUS
INTRAVENOUS | Status: DC | PRN
  Administered 2023-07-27: 50 mg via INTRAVENOUS
  Administered 2023-07-27: 150 mg via INTRAVENOUS

## 2023-07-27 MED ORDER — HYDROMORPHONE HCL 1 MG/ML IJ SOLN: 0.2500 mg | INTRAMUSCULAR | Status: DC | PRN

## 2023-07-27 MED ORDER — OXYCODONE HCL 5 MG PO TABS: 5.0000 mg | ORAL_TABLET | Freq: Once | ORAL | Status: DC | PRN

## 2023-07-27 MED ORDER — ORAL CARE MOUTH RINSE: 15.0000 mL | Freq: Once | OROMUCOSAL | Status: AC

## 2023-07-27 MED ORDER — CHLORHEXIDINE GLUCONATE 0.12 % MT SOLN: 15.0000 mL | Freq: Once | OROMUCOSAL | Status: AC

## 2023-07-27 MED ORDER — SODIUM CHLORIDE 0.9 % IV SOLN: INTRAVENOUS | Status: DC | PRN

## 2023-07-27 MED ORDER — CEFAZOLIN SODIUM-DEXTROSE 2-3 GM-%(50ML) IV SOLR
INTRAVENOUS | Status: DC | PRN
  Administered 2023-07-27: 2 g via INTRAVENOUS

## 2023-07-27 MED ORDER — LIDOCAINE 2% (20 MG/ML) 5 ML SYRINGE
INTRAMUSCULAR | Status: DC | PRN
  Administered 2023-07-27: 60 mg via INTRAVENOUS

## 2023-07-27 MED ORDER — BUPIVACAINE LIPOSOME 1.3 % IJ SUSP
INTRAMUSCULAR | Status: AC
  Filled 2023-07-27: qty 20

## 2023-07-27 MED ORDER — MIDAZOLAM HCL 2 MG/2ML IJ SOLN
INTRAMUSCULAR | Status: AC
  Filled 2023-07-27: qty 2

## 2023-07-27 MED ORDER — 0.9 % SODIUM CHLORIDE (POUR BTL) OPTIME
TOPICAL | Status: DC | PRN
  Administered 2023-07-27: 1000 mL

## 2023-07-27 MED ORDER — THROMBIN 20000 UNITS EX SOLR
CUTANEOUS | Status: AC
  Filled 2023-07-27: qty 20000

## 2023-07-27 MED ORDER — OXYCODONE HCL 5 MG/5ML PO SOLN: 5.0000 mg | Freq: Once | ORAL | Status: DC | PRN

## 2023-07-27 MED ORDER — CHLORHEXIDINE GLUCONATE 0.12 % MT SOLN
OROMUCOSAL | Status: AC
  Administered 2023-07-27: 15 mL via OROMUCOSAL
  Filled 2023-07-27: qty 15

## 2023-07-27 MED ORDER — BUPIVACAINE-EPINEPHRINE 0.5% -1:200000 IJ SOLN
INTRAMUSCULAR | Status: DC | PRN
  Administered 2023-07-27: 40 mL

## 2023-07-27 MED ORDER — ONDANSETRON HCL 4 MG/2ML IJ SOLN: 4.0000 mg | Freq: Once | INTRAMUSCULAR | Status: DC | PRN

## 2023-07-27 MED ORDER — FENTANYL CITRATE (PF) 250 MCG/5ML IJ SOLN
INTRAMUSCULAR | Status: DC | PRN
  Administered 2023-07-27 (×2): 50 ug via INTRAVENOUS

## 2023-07-27 MED ORDER — DEXAMETHASONE SODIUM PHOSPHATE 10 MG/ML IJ SOLN
INTRAMUSCULAR | Status: AC
  Filled 2023-07-27: qty 1

## 2023-07-27 MED ORDER — CEFAZOLIN SODIUM-DEXTROSE 2-4 GM/100ML-% IV SOLN
INTRAVENOUS | Status: AC
  Filled 2023-07-27: qty 100

## 2023-07-27 MED ORDER — LACTATED RINGERS IV SOLN: INTRAVENOUS | Status: DC

## 2023-07-27 MED ORDER — EPHEDRINE SULFATE-NACL 50-0.9 MG/10ML-% IV SOSY
PREFILLED_SYRINGE | INTRAVENOUS | Status: DC | PRN
  Administered 2023-07-27 (×2): 10 mg via INTRAVENOUS

## 2023-07-27 MED ORDER — OXYCODONE HCL 5 MG PO TABS
5.0000 mg | ORAL_TABLET | Freq: Four times a day (QID) | ORAL | 0 refills | Status: DC | PRN
Start: 2023-07-27 — End: 2023-10-16
  Filled 2023-07-27: qty 15, 4d supply, fill #0

## 2023-07-27 MED ORDER — GLYCOPYRROLATE PF 0.2 MG/ML IJ SOSY
PREFILLED_SYRINGE | INTRAMUSCULAR | Status: DC | PRN
  Administered 2023-07-27: .2 mg via INTRAVENOUS

## 2023-07-27 MED ORDER — ONDANSETRON HCL 4 MG/2ML IJ SOLN
INTRAMUSCULAR | Status: DC | PRN
  Administered 2023-07-27: 4 mg via INTRAVENOUS

## 2023-07-27 MED ORDER — ONDANSETRON HCL 4 MG/2ML IJ SOLN
INTRAMUSCULAR | Status: AC
  Filled 2023-07-27: qty 2

## 2023-07-27 MED ORDER — SODIUM CHLORIDE 0.9% FLUSH: 10.0000 mL | Freq: Two times a day (BID) | INTRAVENOUS | Status: DC

## 2023-07-27 MED ORDER — LIDOCAINE 2% (20 MG/ML) 5 ML SYRINGE
INTRAMUSCULAR | Status: AC
  Filled 2023-07-27: qty 5

## 2023-07-27 MED ORDER — FENTANYL CITRATE (PF) 250 MCG/5ML IJ SOLN
INTRAMUSCULAR | Status: AC
  Filled 2023-07-27: qty 5

## 2023-07-27 MED ORDER — BUPIVACAINE-EPINEPHRINE (PF) 0.5% -1:200000 IJ SOLN
INTRAMUSCULAR | Status: AC
  Filled 2023-07-27: qty 30

## 2023-07-27 MED ORDER — PROPOFOL 10 MG/ML IV BOLUS
INTRAVENOUS | Status: AC
  Filled 2023-07-27: qty 20

## 2023-07-27 MED ORDER — DEXAMETHASONE SODIUM PHOSPHATE 10 MG/ML IJ SOLN
INTRAMUSCULAR | Status: DC | PRN
  Administered 2023-07-27: 5 mg via INTRAVENOUS

## 2023-07-27 SURGICAL SUPPLY — 38 items
BAG COUNTER SPONGE SURGICOUNT (BAG) ×1 IMPLANT
BAG SPNG CNTER NS LX DISP (BAG) ×1
CANISTER SUCT 3000ML PPV (MISCELLANEOUS) ×1 IMPLANT
COVER MAYO STAND STRL (DRAPES) ×1 IMPLANT
COVER SURGICAL LIGHT HANDLE (MISCELLANEOUS) ×1 IMPLANT
DRAPE UTILITY XL STRL (DRAPES) ×1 IMPLANT
ELECT REM PT RETURN 9FT ADLT (ELECTROSURGICAL) ×1
ELECTRODE REM PT RTRN 9FT ADLT (ELECTROSURGICAL) ×1 IMPLANT
GAUZE 4X4 16PLY ~~LOC~~+RFID DBL (SPONGE) ×1 IMPLANT
GAUZE SPONGE 4X4 12PLY STRL (GAUZE/BANDAGES/DRESSINGS) ×1 IMPLANT
GLOVE BIO SURGEON STRL SZ8 (GLOVE) ×1 IMPLANT
GLOVE BIOGEL PI IND STRL 8 (GLOVE) ×1 IMPLANT
GOWN STRL REUS W/ TWL LRG LVL3 (GOWN DISPOSABLE) ×1 IMPLANT
GOWN STRL REUS W/ TWL XL LVL3 (GOWN DISPOSABLE) ×1 IMPLANT
GOWN STRL REUS W/TWL LRG LVL3 (GOWN DISPOSABLE) ×1
GOWN STRL REUS W/TWL XL LVL3 (GOWN DISPOSABLE) ×1
KIT BASIN OR (CUSTOM PROCEDURE TRAY) ×1 IMPLANT
KIT SIGMOIDOSCOPE (SET/KITS/TRAYS/PACK) IMPLANT
KIT TURNOVER KIT B (KITS) ×1 IMPLANT
NDL 22X1.5 STRL (OR ONLY) (MISCELLANEOUS) ×1 IMPLANT
NEEDLE 22X1.5 STRL (OR ONLY) (MISCELLANEOUS) ×1
NS IRRIG 1000ML POUR BTL (IV SOLUTION) ×1 IMPLANT
PACK LITHOTOMY IV (CUSTOM PROCEDURE TRAY) ×1 IMPLANT
PAD ARMBOARD 7.5X6 YLW CONV (MISCELLANEOUS) ×2 IMPLANT
PENCIL SMOKE EVACUATOR (MISCELLANEOUS) ×1 IMPLANT
SHEARS HARMONIC 9CM CVD (BLADE) ×1 IMPLANT
SPECIMEN JAR SMALL (MISCELLANEOUS) ×1 IMPLANT
SPONGE SURGIFOAM ABS GEL 100 (HEMOSTASIS) IMPLANT
SURGILUBE 2OZ TUBE FLIPTOP (MISCELLANEOUS) ×1 IMPLANT
SUT CHROMIC 2 0 SH (SUTURE) ×1 IMPLANT
SUT CHROMIC 3 0 SH 27 (SUTURE) ×1 IMPLANT
SUT VIC AB 3-0 SH 27 (SUTURE) ×2
SUT VIC AB 3-0 SH 27X BRD (SUTURE) IMPLANT
SYR CONTROL 10ML LL (SYRINGE) ×1 IMPLANT
TOWEL GREEN STERILE FF (TOWEL DISPOSABLE) ×2 IMPLANT
TUBE CONNECTING 12X1/4 (SUCTIONS) ×1 IMPLANT
UNDERPAD 30X36 HEAVY ABSORB (UNDERPADS AND DIAPERS) ×1 IMPLANT
YANKAUER SUCT BULB TIP NO VENT (SUCTIONS) ×1 IMPLANT

## 2023-07-27 NOTE — H&P (Signed)
Gregory Hale is an 72 y.o. male.   Chief Complaint: external hemorrhoids HPI: 72yo M presents for external hemorrhoidectomy. Symptoms have been about the same.   Past Medical History:  Diagnosis Date   Arthritis    Ascending aorta dilatation (HCC)    Bradycardia    a. HR 40 in setting of acute inferior STEMI 03/2017.   CAD in native artery    a. inf STEMI 03/2017 a/w hypotension and bradycardia -> s/p DES to RCA, otherwise mild disease in LAD, moderate disease in ostium of the moderate caliber diagonal branch, normal LVEF >65%   Dilated aortic root (HCC)    Heart attack (HCC)    Hypertension    Sleep apnea    STEMI involving right coronary artery (HCC)    04/22/17 PCI/DES to mRCA, normal EF   Tobacco abuse    Quit in 1998    Past Surgical History:  Procedure Laterality Date   CORONARY STENT INTERVENTION N/A 04/22/2017   Procedure: Coronary Stent Intervention;  Surgeon: Kathleene Hazel, MD;  [facility] INVASIVE CV LAB;  Service: Cardiovascular;  Laterality: N/A;   LEFT HEART CATH AND CORONARY ANGIOGRAPHY N/A 04/22/2017   Procedure: Left Heart Cath and Coronary Angiography;  Surgeon: Kathleene Hazel, MD;  Location: [facility] INVASIVE CV LAB;  Service: Cardiovascular;  Laterality: N/A;   parotid Left 1980   TONSILLECTOMY     as a child    Family History  Problem Relation Age of Onset   CAD Brother    Diabetes Brother    Rheumatic fever Father    Other Father        rheumatic heart   Pancreatic cancer Sister    Colon cancer Neg Hx    Esophageal cancer Neg Hx    Stomach cancer Neg Hx    Rectal cancer Neg Hx    Social History:  reports that he quit smoking about 34 years ago. His smoking use included cigarettes. He has never used smokeless tobacco. He reports current alcohol use of about 10.0 standard drinks of alcohol per week. He reports that he does not use drugs.  Allergies:  Allergies  Allergen Reactions   Advil [Ibuprofen] Other (See Comments)    bleeding     Medications Prior to Admission  Medication Sig Dispense Refill   amLODipine (NORVASC) 5 MG tablet TAKE 1 TABLET BY MOUTH DAILY 90 tablet 0   aspirin EC 81 MG tablet Take 1 tablet (81 mg total) by mouth daily. Swallow whole. 90 tablet 3   atorvastatin (LIPITOR) 80 MG tablet Take 1 tablet (80 mg total) by mouth every evening. 90 tablet 1   celecoxib (CELEBREX) 100 MG capsule Take 1 capsule (100 mg total) by mouth 2 (two) times daily. 180 capsule 0   famotidine (PEPCID) 10 MG tablet Take 10 mg by mouth every other day.     irbesartan (AVAPRO) 300 MG tablet Take 1 tablet (300 mg total) by mouth daily. 90 tablet 1   metoprolol succinate (TOPROL-XL) 25 MG 24 hr tablet Take 6.25 mg by mouth daily.     nitroGLYCERIN (NITROSTAT) 0.4 MG SL tablet Place 1 tablet (0.4 mg total) under the tongue every 5 (five) minutes as needed. 25 tablet 3   Sod Picosulfate-Mag Ox-Cit Acd (CLENPIQ) 10-3.5-12 MG-GM -GM/175ML SOLN Take 1 kit by mouth as directed. 175 mL 0    Results for orders placed or performed during the hospital encounter of 07/27/23 (from the past 48 hour(s))  CBC per protocol  Status: None   Collection Time: 07/27/23 10:43 AM  Result Value Ref Range   WBC 8.1 4.0 - 10.5 K/uL   RBC 4.63 4.22 - 5.81 MIL/uL   Hemoglobin 14.1 13.0 - 17.0 g/dL   HCT 16.1 09.6 - 04.5 %   MCV 93.1 80.0 - 100.0 fL   MCH 30.5 26.0 - 34.0 pg   MCHC 32.7 30.0 - 36.0 g/dL   RDW 40.9 81.1 - 91.4 %   Platelets 179 150 - 400 K/uL   nRBC 0.0 0.0 - 0.2 %    Comment: Performed at [facility], [address] [place], [place] [address]  Basic metabolic panel per protocol     Status: None   Collection Time: 07/27/23 10:43 AM  Result Value Ref Range   Sodium 137 135 - 145 mmol/L   Potassium 4.0 3.5 - 5.1 mmol/L   Chloride 101 98 - 111 mmol/L   CO2 23 22 - 32 mmol/L   Glucose, Bld 99 70 - 99 mg/dL    Comment: Glucose reference range applies only to samples taken after fasting for at least 8 hours.   BUN 11 8 -  23 mg/dL   Creatinine, Ser 6.21 0.61 - 1.24 mg/dL   Calcium 9.0 8.9 - 30.8 mg/dL   GFR, Estimated >65 >78 mL/min    Comment: (NOTE) Calculated using the CKD-EPI Creatinine Equation (2021)    Anion gap 13 5 - 15    Comment: Performed at [facility], [address] [place], [place] [address]   No results found.  Review of Systems  Blood pressure (!) 149/97, pulse 60, temperature 97.6 F (36.4 C), temperature source Oral, resp. rate 17, height 5\' 9"  (1.753 m), weight 86.2 kg, SpO2 98%. Physical Exam Eyes:     Pupils: Pupils are equal, round, and reactive to light.  Cardiovascular:     Rate and Rhythm: Normal rate and regular rhythm.  Pulmonary:     Effort: Pulmonary effort is normal.     Breath sounds: Normal breath sounds.  Abdominal:     General: Abdomen is flat.     Palpations: Abdomen is soft.  Genitourinary:    Comments: See office note Skin:    General: Skin is warm.  Neurological:     Mental Status: He is alert and oriented to person, place, and time.  Psychiatric:        Mood and Affect: Mood normal.      Assessment/Plan External hemorrhoids - for external hemorrhoidectomy, possible internal hemorrhoidectomy. Procedure, risks, and benefits discussed. I also discussed the expected post-op course. He agrees.  Liz Malady, MD 07/27/2023, 12:34 PM

## 2023-07-27 NOTE — Anesthesia Postprocedure Evaluation (Signed)
Anesthesia Post Note  Patient: Gregory Hale  Procedure(s) Performed: EXTERNAL HEMORRHOIDECTOMY (Rectum)     Patient location during evaluation: PACU Anesthesia Type: General Level of consciousness: awake and alert and oriented Pain management: pain level controlled Vital Signs Assessment: post-procedure vital signs reviewed and stable Respiratory status: spontaneous breathing, nonlabored ventilation and respiratory function stable Cardiovascular status: blood pressure returned to baseline and stable Postop Assessment: no apparent nausea or vomiting Anesthetic complications: no   No notable events documented.  Last Vitals:  Vitals:   07/27/23 1415 07/27/23 1430  BP: 129/87 131/84  Pulse: 70 63  Resp: 12 10  Temp:  36.6 C  SpO2: 96% 97%    Last Pain:  Vitals:   07/27/23 1402  TempSrc:   PainSc: 0-No pain                 Khylen Riolo A.

## 2023-07-27 NOTE — Anesthesia Procedure Notes (Signed)
Procedure Name: LMA Insertion Date/Time: 07/27/2023 1:11 PM  Performed by: Shary Decamp, CRNAPre-anesthesia Checklist: Patient identified, Emergency Drugs available, Suction available and Patient being monitored Patient Re-evaluated:Patient Re-evaluated prior to induction Oxygen Delivery Method: Circle system utilized Preoxygenation: Pre-oxygenation with 100% oxygen Induction Type: IV induction Ventilation: Mask ventilation without difficulty LMA: LMA flexible inserted LMA Size: 5.0 Number of attempts: 1 Placement Confirmation: positive ETCO2 and breath sounds checked- equal and bilateral Tube secured with: Tape Dental Injury: Teeth and Oropharynx as per pre-operative assessment

## 2023-07-27 NOTE — Transfer of Care (Signed)
Immediate Anesthesia Transfer of Care Note  Patient: Gregory Hale  Procedure(s) Performed: EXTERNAL HEMORRHOIDECTOMY (Rectum)  Patient Location: PACU  Anesthesia Type:General  Level of Consciousness: awake, alert , and patient cooperative  Airway & Oxygen Therapy: Patient Spontanous Breathing and Patient connected to face mask oxygen  Post-op Assessment: Report given to RN and Post -op Vital signs reviewed and stable  Post vital signs: Reviewed and stable  Last Vitals:  Vitals Value Taken Time  BP 145/81 07/27/23 1403  Temp    Pulse 70 07/27/23 1404  Resp 19 07/27/23 1404  SpO2 98 % 07/27/23 1404  Vitals shown include unfiled device data.  Last Pain:  Vitals:   07/27/23 1402  TempSrc:   PainSc: 0-No pain         Complications: No notable events documented.

## 2023-07-27 NOTE — Op Note (Signed)
  07/27/2023  1:57 PM  PATIENT:  Gregory Hale  72 y.o. male  PRE-OPERATIVE DIAGNOSIS:  EXTERNAL HEMORRHOIDS  POST-OPERATIVE DIAGNOSIS:  EXTERNAL HEMORRHOIDS X 3, INTERNAL HEMORRHOIDS X 2  PROCEDURE:  Procedure(s): EXTERNAL HEMORRHOIDECTOMY X 3 2 COLUMN INTERNAL HEMORRHOIDECTOMY  SURGEON:  Surgeon(s): Violeta Gelinas, MD  ASSISTANTS: none   ANESTHESIA:   local and general  EBL:  Total I/O In: 250 [I.V.:200; IV Piggyback:50] Out: 30 [Blood:30]  BLOOD ADMINISTERED:none  DRAINS: none   SPECIMEN:  Excision  DISPOSITION OF SPECIMEN:  PATHOLOGY  COUNTS:  YES  DICTATION: .Dragon Dictation Procedure detail: Informed consent was obtained.  He received intravenous antibiotics.  He was brought to the operating room.  General anesthesia was administered with laryngeal mask airway by the anesthesia team.  He was placed in lithotomy position.  His perianal region was prepped and draped in a sterile fashion.  We did a timeout procedure.  The perianal region was infiltrated with a 50/50 mix of Exparel and local.  Examination under anesthesia revealed 3 large external hemorrhoids.  2 of them were contiguous with an internal hemorrhoid.  Thick that was the external hemorrhoid at 8:00 and the external hemorrhoid at 4:00.  The more anterior external hemorrhoid did not have an associated internal hemorrhoid.  Attention was directed to the 4:00 external hemorrhoid.  The anoderm was incised and I dissected the hemorrhoidal tissue using the harmonic scalpel and it was excised.  The dissection remained superficial to the sphincters.  Specimen was sent to pathology.  Similarly, the large external hemorrhoid at the 8 o'clock position was removed.  Again, the anoderm was incised and then the hemorrhoidal tissue was dissected off of the underlying tissues using the harmonic scalpel and removed.  We stayed superficial to the sphincters.  Specimen was sent to pathology.  The more anterior external hemorrhoid  was also excised in a similar fashion using the harmonic scalpel.  The area was irrigated.  Hemostasis was ensured using cautery.  I then closed the anoderm with some interrupted 3-0 Vicryl's.  The rectal area was again checked for hemostasis and it was excellent.  I placed an endorectal dressing of Gelfoam soaked in thrombin.  All counts were correct.  He tolerated the procedure well without apparent complication and was taken to recovery in stable condition. PATIENT DISPOSITION:  PACU - hemodynamically stable.   Delay start of Pharmacological VTE agent (>24hrs) due to surgical blood loss or risk of bleeding:  no  Violeta Gelinas, MD, MPH, FACS Pager: [phone]  10/31/20241:57 PM

## 2023-07-28 ENCOUNTER — Encounter (HOSPITAL_COMMUNITY): Payer: Self-pay | Admitting: General Surgery

## 2023-07-28 LAB — SURGICAL PATHOLOGY

## 2023-08-18 DIAGNOSIS — Z23 Encounter for immunization: Secondary | ICD-10-CM | POA: Diagnosis not present

## 2023-08-28 ENCOUNTER — Encounter: Payer: Self-pay | Admitting: Cardiology

## 2023-08-28 ENCOUNTER — Ambulatory Visit: Payer: Medicare Other | Attending: Cardiovascular Disease | Admitting: Cardiovascular Disease

## 2023-08-28 ENCOUNTER — Encounter: Payer: Self-pay | Admitting: Cardiovascular Disease

## 2023-08-28 VITALS — BP 118/70 | HR 77 | Ht 69.0 in | Wt 191.8 lb

## 2023-08-28 DIAGNOSIS — I7121 Aneurysm of the ascending aorta, without rupture: Secondary | ICD-10-CM | POA: Insufficient documentation

## 2023-08-28 DIAGNOSIS — I2510 Atherosclerotic heart disease of native coronary artery without angina pectoris: Secondary | ICD-10-CM | POA: Diagnosis not present

## 2023-08-28 DIAGNOSIS — I495 Sick sinus syndrome: Secondary | ICD-10-CM | POA: Diagnosis not present

## 2023-08-28 NOTE — Patient Instructions (Signed)
Medication Instructions:  No changes *If you need a refill on your cardiac medications before your next appointment, please call your pharmacy*   Lab Work: none  Testing/Procedures: Chest MRA -   Follow-Up: At [facility], you and your health needs are our priority.  As part of our continuing mission to provide you with exceptional heart care, we have created designated Provider Care Teams.  These Care Teams include your primary Cardiologist (physician) and Advanced Practice Providers (APPs -  Physician Assistants and Nurse Practitioners) who all work together to provide you with the care you need, when you need it.   Your next appointment:   12 month(s)  Provider:   Verne Carrow, MD

## 2023-08-28 NOTE — Progress Notes (Signed)
Chief Complaint  Patient presents with   Follow-up    CAD   History of Present Illness: 72 yo male with history of CAD, former tobacco abuse, OA and thoracic aortic aneurysm here today for cardiac follow up. He was admitted to [facility] 04/22/17 with an acute inferior STEMI. His RCA was occluded and was treated with a drug eluting stent. There was mild disease noted in the LAD and moderate disease in the Diagonal branch. LVEF=65%. Echo 04/23/17 with normal LV systolic function, LVEF=60-65%, no valve disease, mild dilation of aortic root. He has been followed for mild dilation of the ascending aorta. Most recent chest MRA December 2022 with stable 4.1 cm ascending aortic aneurysm and no evidence of abdominal aortic aneurysm. He was noted to have bradycardia in May 2024 and showed nocturnal bradycardia and AV block. He was seen in the EP clinic by Dr. Lalla Brothers and he did not feel that a pacemaker was indicated given lack of symptoms. Sleep study with severe sleep apnea. CPAP was recommended but he has not yet started this. Echo July 2024 with normal LV function and no significant valve disease.   He is here today for follow up. The patient denies any chest pain, dyspnea, palpitations, lower extremity edema, orthopnea, PND, dizziness, near syncope or syncope.    Primary Care Physician: Etta Grandchild, MD  Past Medical History:  Diagnosis Date   Arthritis    Ascending aorta dilatation (HCC)    Bradycardia    a. HR 40 in setting of acute inferior STEMI 03/2017.   CAD in native artery    a. inf STEMI 03/2017 a/w hypotension and bradycardia -> s/p DES to RCA, otherwise mild disease in LAD, moderate disease in ostium of the moderate caliber diagonal branch, normal LVEF >65%   Dilated aortic root (HCC)    Heart attack (HCC)    Hypertension    Sleep apnea    STEMI involving right coronary artery (HCC)    04/22/17 PCI/DES to mRCA, normal EF   Tobacco abuse    Quit in 1998    Past Surgical History:   Procedure Laterality Date   CORONARY STENT INTERVENTION N/A 04/22/2017   Procedure: Coronary Stent Intervention;  Surgeon: Kathleene Hazel, MD;  Location: [facility] INVASIVE CV LAB;  Service: Cardiovascular;  Laterality: N/A;   HEMORRHOID SURGERY N/A 07/27/2023   Procedure: EXTERNAL HEMORRHOIDECTOMY;  Surgeon: Violeta Gelinas, MD;  Location: [facility] OR;  Service: General;  Laterality: N/A;   LEFT HEART CATH AND CORONARY ANGIOGRAPHY N/A 04/22/2017   Procedure: Left Heart Cath and Coronary Angiography;  Surgeon: Kathleene Hazel, MD;  Location: [facility] INVASIVE CV LAB;  Service: Cardiovascular;  Laterality: N/A;   parotid Left 1980   TONSILLECTOMY     as a child    Current Outpatient Medications  Medication Sig Dispense Refill   amLODipine (NORVASC) 5 MG tablet TAKE 1 TABLET BY MOUTH DAILY 90 tablet 0   aspirin EC 81 MG tablet Take 1 tablet (81 mg total) by mouth daily. Swallow whole. 90 tablet 3   atorvastatin (LIPITOR) 80 MG tablet Take 1 tablet (80 mg total) by mouth every evening. 90 tablet 1   celecoxib (CELEBREX) 100 MG capsule Take 1 capsule (100 mg total) by mouth 2 (two) times daily. 180 capsule 0   famotidine (PEPCID) 10 MG tablet Take 10 mg by mouth every other day.     irbesartan (AVAPRO) 300 MG tablet Take 1 tablet (300 mg total) by mouth daily. 90  tablet 1   metoprolol succinate (TOPROL-XL) 25 MG 24 hr tablet Take 6.25 mg by mouth daily.     nitroGLYCERIN (NITROSTAT) 0.4 MG SL tablet Place 1 tablet (0.4 mg total) under the tongue every 5 (five) minutes as needed. 25 tablet 3   oxyCODONE (OXY IR/ROXICODONE) 5 MG immediate release tablet Take 1 tablet (5 mg total) by mouth every 6 (six) hours as needed for up to 20 doses for severe pain (pain score 7-10). 15 tablet 0   Sod Picosulfate-Mag Ox-Cit Acd (CLENPIQ) 10-3.5-12 MG-GM -GM/175ML SOLN Take 1 kit by mouth as directed. 175 mL 0   No current facility-administered medications for this visit.    Allergies  Allergen Reactions    Advil [Ibuprofen] Other (See Comments)    bleeding    Social History   Socioeconomic History   Marital status: Married    Spouse name: Not on file   Number of children: 1   Years of education: Not on file   Highest education level: Bachelor's degree (e.g., BA, AB, BS)  Occupational History   Occupation: retired  Tobacco Use   Smoking status: Former    Current packs/day: 0.00    Types: Cigarettes    Quit date: 1990    Years since quitting: 34.9   Smokeless tobacco: Never  Vaping Use   Vaping status: Never Used  Substance and Sexual Activity   Alcohol use: Yes    Alcohol/week: 10.0 standard drinks of alcohol    Types: 10 Cans of beer per week    Comment: social   Drug use: No   Sexual activity: Yes    Partners: Female  Other Topics Concern   Not on file  Social History Narrative   Not on file   Social Determinants of Health   Financial Resource Strain: Low Risk  (02/05/2023)   Overall Financial Resource Strain (CARDIA)    Difficulty of Paying Living Expenses: Not hard at all  Food Insecurity: No Food Insecurity (02/05/2023)   Hunger Vital Sign    Worried About Running Out of Food in the Last Year: Never true    Ran Out of Food in the Last Year: Never true  Transportation Needs: No Transportation Needs (02/05/2023)   PRAPARE - Administrator, Civil Service (Medical): No    Lack of Transportation (Non-Medical): No  Physical Activity: Sufficiently Active (02/05/2023)   Exercise Vital Sign    Days of Exercise per Week: 6 days    Minutes of Exercise per Session: 150+ min  Stress: No Stress Concern Present (02/05/2023)   [employer] of Occupational Health - Occupational Stress Questionnaire    Feeling of Stress : Only a little  Social Connections: Socially Integrated (02/05/2023)   Social Connection and Isolation Panel [NHANES]    Frequency of Communication with Friends and Family: More than three times a week    Frequency of Social Gatherings with  Friends and Family: More than three times a week    Attends Religious Services: 1 to 4 times per year    Active Member of [employer] or Organizations: Yes    Attends Engineer, structural: More than 4 times per year    Marital Status: Married  Catering manager Violence: Not on file    Family History  Problem Relation Age of Onset   CAD Brother    Diabetes Brother    Rheumatic fever Father    Other Father        rheumatic heart  Pancreatic cancer Sister    Colon cancer Neg Hx    Esophageal cancer Neg Hx    Stomach cancer Neg Hx    Rectal cancer Neg Hx     Review of Systems:  As stated in the HPI and otherwise negative.   BP 118/70   Pulse 77   Ht 5\' 9"  (1.753 m)   Wt 87 kg   SpO2 96%   BMI 28.32 kg/m   Physical Examination: General: Well developed, well nourished, NAD  HEENT: OP clear, mucus membranes moist  SKIN: warm, dry. No rashes. Neuro: No focal deficits  Musculoskeletal: Muscle strength 5/5 all ext  Psychiatric: Mood and affect normal  Neck: No JVD, no carotid bruits, no thyromegaly, no lymphadenopathy.  Lungs:Clear bilaterally, no wheezes, rhonci, crackles Cardiovascular: Regular rate and rhythm. No murmurs, gallops or rubs. Abdomen:Soft. Bowel sounds present. Non-tender.  Extremities: No lower extremity edema. Pulses are 2 + in the bilateral DP/PT.  EKG:  EKG is not ordered today. The ekg ordered today demonstrates   Recent Labs: 03/16/2023: ALT 15 06/12/2023: TSH 2.93 07/27/2023: BUN 11; Creatinine, Ser 0.99; Hemoglobin 14.1; Platelets 179; Potassium 4.0; Sodium 137   Lipid Panel    Component Value Date/Time   CHOL 124 06/12/2023 1148   CHOL 106 05/22/2017 0814   CHOL 179 06/24/2011 0000   TRIG 42.0 06/12/2023 1148   TRIG 63 06/24/2011 0000   HDL 75.70 06/12/2023 1148   HDL 53 05/22/2017 0814   CHOLHDL 2 06/12/2023 1148   VLDL 8.4 06/12/2023 1148   LDLCALC 40 06/12/2023 1148   LDLCALC 45 05/22/2017 0814     Wt Readings from Last 3  Encounters:  08/28/23 87 kg  07/27/23 86.2 kg  07/17/23 86.2 kg     Assessment and Plan:   1. CAD without angina: No chest pain suggestive of angina. BP is well controlled. LDL at goal in September 2024. Continue ASA, statin and low dose beta blocker.    2. Thoracic aortic aneurysm: Chest MRA December 2022 with stable 4.1 cm ascending aortic aneurysm.  Will plan to repeat Chest MRA now.   3. Tachy/brady syndrome: No dizziness. He was seen by EP in July 2024 and no indication for pacemaker.   Labs/ tests ordered today include:   Orders Placed This Encounter  Procedures   MR Angiogram Chest W Wo Contrast   Disposition:   F/U with me in 12 months  Signed, Verne Carrow, MD 08/28/2023 3:40 PM    [facility] [address] [place], [place], [place]  [address] Phone: [phone]; Fax: [phone]

## 2023-08-30 ENCOUNTER — Telehealth: Payer: Self-pay | Admitting: Primary Care

## 2023-08-30 DIAGNOSIS — G4730 Sleep apnea, unspecified: Secondary | ICD-10-CM

## 2023-08-30 DIAGNOSIS — G4733 Obstructive sleep apnea (adult) (pediatric): Secondary | ICD-10-CM

## 2023-08-30 NOTE — Telephone Encounter (Signed)
Patient would like order for CPAP machine. Patient phone number is [phone].

## 2023-09-04 NOTE — Telephone Encounter (Signed)
Please see last 2 telephone encounters. PT is still waiting for the CPAP machine. I do not see an order in place. Pls call @ [phone]

## 2023-09-04 NOTE — Telephone Encounter (Signed)
He was thinking about whether or not he wanted to be started on PAP therapy during our last visit and was advised to get back to [place] if he wanted order sent in, when he called back I do not think the nurse knew that the order was never placed so it was not done. This information does not need to be relayed to patient unless he asks. Regardless I will re-enter CPAP order. He should get CPAP unit in 2-4 weeks.

## 2023-09-04 NOTE — Telephone Encounter (Signed)
I called the pt and there was no answer- left detailed msg that PAP order was placed.

## 2023-09-17 ENCOUNTER — Encounter: Payer: Self-pay | Admitting: Internal Medicine

## 2023-09-17 DIAGNOSIS — M545 Low back pain, unspecified: Secondary | ICD-10-CM

## 2023-09-17 DIAGNOSIS — M150 Primary generalized (osteo)arthritis: Secondary | ICD-10-CM

## 2023-09-19 ENCOUNTER — Other Ambulatory Visit: Payer: Self-pay | Admitting: Internal Medicine

## 2023-09-19 DIAGNOSIS — M150 Primary generalized (osteo)arthritis: Secondary | ICD-10-CM

## 2023-09-19 DIAGNOSIS — G8929 Other chronic pain: Secondary | ICD-10-CM

## 2023-09-19 DIAGNOSIS — M545 Low back pain, unspecified: Secondary | ICD-10-CM

## 2023-09-26 NOTE — Addendum Note (Signed)
Addended by: Gay Filler T on: 09/26/2023 10:56 AM   Modules accepted: Orders

## 2023-09-29 ENCOUNTER — Ambulatory Visit: Payer: Medicare Other | Admitting: Cardiology

## 2023-10-02 ENCOUNTER — Encounter: Payer: Medicare Other | Admitting: Gastroenterology

## 2023-10-02 ENCOUNTER — Ambulatory Visit: Payer: Medicare Other | Admitting: Primary Care

## 2023-10-03 ENCOUNTER — Encounter: Payer: Medicare Other | Admitting: Gastroenterology

## 2023-10-04 ENCOUNTER — Other Ambulatory Visit: Payer: Self-pay

## 2023-10-04 DIAGNOSIS — M150 Primary generalized (osteo)arthritis: Secondary | ICD-10-CM

## 2023-10-04 DIAGNOSIS — M545 Low back pain, unspecified: Secondary | ICD-10-CM

## 2023-10-04 MED ORDER — CELECOXIB 100 MG PO CAPS
100.0000 mg | ORAL_CAPSULE | Freq: Two times a day (BID) | ORAL | 0 refills | Status: DC
Start: 2023-10-04 — End: 2023-12-07

## 2023-10-08 ENCOUNTER — Encounter: Payer: Self-pay | Admitting: Certified Registered Nurse Anesthetist

## 2023-10-09 ENCOUNTER — Other Ambulatory Visit: Payer: Self-pay | Admitting: Internal Medicine

## 2023-10-09 DIAGNOSIS — I2510 Atherosclerotic heart disease of native coronary artery without angina pectoris: Secondary | ICD-10-CM

## 2023-10-09 DIAGNOSIS — I10 Essential (primary) hypertension: Secondary | ICD-10-CM

## 2023-10-16 ENCOUNTER — Encounter: Payer: Self-pay | Admitting: Gastroenterology

## 2023-10-16 ENCOUNTER — Ambulatory Visit (AMBULATORY_SURGERY_CENTER): Payer: Medicare Other | Admitting: Gastroenterology

## 2023-10-16 VITALS — BP 144/81 | HR 46 | Temp 98.0°F | Resp 13 | Ht 69.0 in | Wt 198.0 lb

## 2023-10-16 DIAGNOSIS — K621 Rectal polyp: Secondary | ICD-10-CM

## 2023-10-16 DIAGNOSIS — G4733 Obstructive sleep apnea (adult) (pediatric): Secondary | ICD-10-CM | POA: Diagnosis not present

## 2023-10-16 DIAGNOSIS — K635 Polyp of colon: Secondary | ICD-10-CM | POA: Diagnosis not present

## 2023-10-16 DIAGNOSIS — D125 Benign neoplasm of sigmoid colon: Secondary | ICD-10-CM | POA: Diagnosis not present

## 2023-10-16 DIAGNOSIS — K6289 Other specified diseases of anus and rectum: Secondary | ICD-10-CM | POA: Diagnosis not present

## 2023-10-16 DIAGNOSIS — K5730 Diverticulosis of large intestine without perforation or abscess without bleeding: Secondary | ICD-10-CM

## 2023-10-16 DIAGNOSIS — Z860100 Personal history of colon polyps, unspecified: Secondary | ICD-10-CM | POA: Diagnosis not present

## 2023-10-16 DIAGNOSIS — Z1211 Encounter for screening for malignant neoplasm of colon: Secondary | ICD-10-CM

## 2023-10-16 DIAGNOSIS — D123 Benign neoplasm of transverse colon: Secondary | ICD-10-CM | POA: Diagnosis not present

## 2023-10-16 DIAGNOSIS — D127 Benign neoplasm of rectosigmoid junction: Secondary | ICD-10-CM | POA: Diagnosis not present

## 2023-10-16 DIAGNOSIS — K5900 Constipation, unspecified: Secondary | ICD-10-CM | POA: Diagnosis not present

## 2023-10-16 DIAGNOSIS — I2510 Atherosclerotic heart disease of native coronary artery without angina pectoris: Secondary | ICD-10-CM | POA: Diagnosis not present

## 2023-10-16 MED ORDER — SODIUM CHLORIDE 0.9 % IV SOLN: 500.0000 mL | Freq: Once | INTRAVENOUS | Status: DC

## 2023-10-16 NOTE — Progress Notes (Signed)
GASTROENTEROLOGY PROCEDURE H&P NOTE   Primary Care Physician: Etta Grandchild, MD    Reason for Procedure:  Colon polyp surveillance  Plan:    Colonoscopy  Patient is appropriate for endoscopic procedure(s) in the ambulatory ([facility]) setting.  The nature of the procedure, as well as the risks, benefits, and alternatives were carefully and thoroughly reviewed with the patient. Ample time for discussion and questions allowed. The patient understood, was satisfied, and agreed to proceed.     HPI: Gregory Hale is a 73 y.o. male who presents for colonoscopy for ongoing colon polyp surveillance.  Last colonoscopy was 08/10/2020 and notable for nine 5-7 mm polyps in the rectum, sigmoid colon, transverse colon removed, two 3-4 mm polyps in the ascending colon, mild diverticulosis in the left, hypertrophied anal papillae, and internal hemorrhoids. Recommended a more extensive prep next time. Pathology showed tubular adenomas and repeat recommended in 3 years.   Past Medical History:  Diagnosis Date   Arthritis    Ascending aorta dilatation (HCC)    Bradycardia    a. HR 40 in setting of acute inferior STEMI 03/2017.   CAD in native artery    a. inf STEMI 03/2017 a/w hypotension and bradycardia -> s/p DES to RCA, otherwise mild disease in LAD, moderate disease in ostium of the moderate caliber diagonal branch, normal LVEF >65%   Dilated aortic root (HCC)    Heart attack (HCC)    Hyperlipidemia    Hypertension    Sleep apnea    STEMI involving right coronary artery (HCC)    04/22/17 PCI/DES to mRCA, normal EF   Tobacco abuse    Quit in 1998    Past Surgical History:  Procedure Laterality Date   CORONARY STENT INTERVENTION N/A 04/22/2017   Procedure: Coronary Stent Intervention;  Surgeon: Kathleene Hazel, MD;  Location: [facility] INVASIVE CV LAB;  Service: Cardiovascular;  Laterality: N/A;   HEMORRHOID SURGERY N/A 07/27/2023   Procedure: EXTERNAL HEMORRHOIDECTOMY;  Surgeon: Violeta Gelinas, MD;  Location: [facility] OR;  Service: General;  Laterality: N/A;   LEFT HEART CATH AND CORONARY ANGIOGRAPHY N/A 04/22/2017   Procedure: Left Heart Cath and Coronary Angiography;  Surgeon: Kathleene Hazel, MD;  Location: [facility] INVASIVE CV LAB;  Service: Cardiovascular;  Laterality: N/A;   parotid Left 1980   TONSILLECTOMY     as a child    Prior to Admission medications   Medication Sig Start Date End Date Taking? Authorizing Provider  amLODipine (NORVASC) 5 MG tablet TAKE 1 TABLET BY MOUTH DAILY 10/09/23  Yes Etta Grandchild, MD  atorvastatin (LIPITOR) 80 MG tablet Take 1 tablet (80 mg total) by mouth every evening. 07/03/23  Yes Etta Grandchild, MD  irbesartan (AVAPRO) 300 MG tablet Take 1 tablet (300 mg total) by mouth daily. 02/10/23  Yes Etta Grandchild, MD  metoprolol succinate (TOPROL-XL) 25 MG 24 hr tablet Take 6.25 mg by mouth daily.   Yes [provider]  aspirin EC 81 MG tablet Take 1 tablet (81 mg total) by mouth daily. Swallow whole. 06/08/20   Kathleene Hazel, MD  celecoxib (CELEBREX) 100 MG capsule Take 1 capsule (100 mg total) by mouth 2 (two) times daily. 10/04/23   Etta Grandchild, MD  famotidine (PEPCID) 10 MG tablet Take 10 mg by mouth every other day.    [provider]  nitroGLYCERIN (NITROSTAT) 0.4 MG SL tablet Place 1 tablet (0.4 mg total) under the tongue every 5 (five) minutes as needed. 10/04/21  Kathleene Hazel, MD    Current Outpatient Medications  Medication Sig Dispense Refill   amLODipine (NORVASC) 5 MG tablet TAKE 1 TABLET BY MOUTH DAILY 90 tablet 0   atorvastatin (LIPITOR) 80 MG tablet Take 1 tablet (80 mg total) by mouth every evening. 90 tablet 1   irbesartan (AVAPRO) 300 MG tablet Take 1 tablet (300 mg total) by mouth daily. 90 tablet 1   metoprolol succinate (TOPROL-XL) 25 MG 24 hr tablet Take 6.25 mg by mouth daily.     aspirin EC 81 MG tablet Take 1 tablet (81 mg total) by mouth daily. Swallow whole. 90 tablet 3    celecoxib (CELEBREX) 100 MG capsule Take 1 capsule (100 mg total) by mouth 2 (two) times daily. 180 capsule 0   famotidine (PEPCID) 10 MG tablet Take 10 mg by mouth every other day.     nitroGLYCERIN (NITROSTAT) 0.4 MG SL tablet Place 1 tablet (0.4 mg total) under the tongue every 5 (five) minutes as needed. 25 tablet 3   Current Facility-Administered Medications  Medication Dose Route Frequency Provider Last Rate Last Admin   0.9 %  sodium chloride infusion  500 mL Intravenous Once Rodnesha Elie V, DO        Allergies as of 10/16/2023 - Review Complete 10/16/2023  Allergen Reaction Noted   Advil [ibuprofen] Other (See Comments) 12/12/2018    Family History  Problem Relation Age of Onset   CAD Brother    Diabetes Brother    Rheumatic fever Father    Other Father        rheumatic heart   Pancreatic cancer Sister    Colon cancer Neg Hx    Esophageal cancer Neg Hx    Stomach cancer Neg Hx    Rectal cancer Neg Hx     Social History   Socioeconomic History   Marital status: Married    Spouse name: Not on file   Number of children: 1   Years of education: Not on file   Highest education level: Bachelor's degree (e.g., BA, AB, BS)  Occupational History   Occupation: retired  Tobacco Use   Smoking status: Former    Current packs/day: 0.00    Types: Cigarettes    Quit date: 1990    Years since quitting: 35.0   Smokeless tobacco: Never  Vaping Use   Vaping status: Never Used  Substance and Sexual Activity   Alcohol use: Yes    Alcohol/week: 10.0 standard drinks of alcohol    Types: 10 Cans of beer per week    Comment: social   Drug use: No   Sexual activity: Yes    Partners: Female  Other Topics Concern   Not on file  Social History Narrative   Not on file   Social Drivers of Health   Financial Resource Strain: Low Risk  (02/05/2023)   Overall Financial Resource Strain (CARDIA)    Difficulty of Paying Living Expenses: Not hard at all  Food Insecurity: No Food  Insecurity (02/05/2023)   Hunger Vital Sign    Worried About Running Out of Food in the Last Year: Never true    Ran Out of Food in the Last Year: Never true  Transportation Needs: No Transportation Needs (02/05/2023)   PRAPARE - Administrator, Civil Service (Medical): No    Lack of Transportation (Non-Medical): No  Physical Activity: Sufficiently Active (02/05/2023)   Exercise Vital Sign    Days of Exercise per Week: 6 days  Minutes of Exercise per Session: 150+ min  Stress: No Stress Concern Present (02/05/2023)   [employer] of Occupational Health - Occupational Stress Questionnaire    Feeling of Stress : Only a little  Social Connections: Socially Integrated (02/05/2023)   Social Connection and Isolation Panel [NHANES]    Frequency of Communication with Friends and Family: More than three times a week    Frequency of Social Gatherings with Friends and Family: More than three times a week    Attends Religious Services: 1 to 4 times per year    Active Member of [employer] or Organizations: Yes    Attends Engineer, structural: More than 4 times per year    Marital Status: Married  Catering manager Violence: Not on file    Physical Exam: Vital signs in last 24 hours: @BP  118/72   Pulse (!) 55   Temp 98 F (36.7 C)   Ht 5\' 9"  (1.753 m)   Wt 198 lb (89.8 kg)   SpO2 98%   BMI 29.24 kg/m  GEN: NAD EYE: Sclerae anicteric ENT: MMM CV: Non-tachycardic Pulm: CTA b/l GI: Soft, NT/ND NEURO:  Alert & Oriented x 3   Doristine Locks, DO [facility]   10/16/2023 8:46 AM

## 2023-10-16 NOTE — Progress Notes (Signed)
Report given to PACU, vss 

## 2023-10-16 NOTE — Op Note (Signed)
[facility] Patient Name: Gregory Hale Procedure Date: 10/16/2023 8:53 AM MRN: [id number] Endoscopist: Doristine Locks , MD, [id number] Age: 73 Referring MD:  Date of Birth: 20-Dec-1950 Gender: Male Account #: [id number] Procedure:                Colonoscopy Indications:              Surveillance: Personal history of adenomatous                            polyps on last colonoscopy 3 years ago                           Last colonoscopy was 08/10/2020 and notable for                            nine 5-7 mm polyps in the rectum, sigmoid colon,                            transverse colon removed, two 3-4 mm polyps in the                            ascending colon, mild diverticulosis in the left,                            hypertrophied anal papillae, and internal                            hemorrhoids. Recommended a more extensive prep next                            time. Pathology showed tubular adenomas and repeat                            recommended in 3 years.                           Underwent hemorrhoid surgery in 06/2023. Medicines:                Monitored Anesthesia Care Procedure:                Pre-Anesthesia Assessment:                           - Prior to the procedure, a History and Physical                            was performed, and patient medications and                            allergies were reviewed. The patient's tolerance of                            previous anesthesia was also reviewed. The risks  and benefits of the procedure and the sedation                            options and risks were discussed with the patient.                            All questions were answered, and informed consent                            was obtained. Prior Anticoagulants: The patient has                            taken no anticoagulant or antiplatelet agents. ASA                            Grade Assessment: III - A patient with  severe                            systemic disease. After reviewing the risks and                            benefits, the patient was deemed in satisfactory                            condition to undergo the procedure.                           After obtaining informed consent, the colonoscope                            was passed under direct vision. Throughout the                            procedure, the patient's blood pressure, pulse, and                            oxygen saturations were monitored continuously. The                            CF [id number] #[id number] was introduced through the anus                            and advanced to the the terminal ileum. The                            colonoscopy was performed without difficulty. The                            patient tolerated the procedure well. The quality                            of the bowel preparation was good. The terminal  ileum, ileocecal valve, appendiceal orifice, and                            rectum were photographed. Scope In: 8:55:30 AM Scope Out: 9:19:44 AM Scope Withdrawal Time: 0 hours 21 minutes 48 seconds  Total Procedure Duration: 0 hours 24 minutes 14 seconds  Findings:                 The perianal and digital rectal examinations were                            normal.                           Two sessile polyps were found in the sigmoid colon                            and transverse colon. The polyps were 3 to 5 mm in                            size. These polyps were removed with a cold snare.                            Resection and retrieval were complete. Estimated                            blood loss was minimal.                           Six sessile polyps were found in the rectum and                            recto-sigmoid colon. The polyps were 2 to 3 mm in                            size. These polyps were removed with a cold snare.                             Resection and retrieval were complete. Estimated                            blood loss was minimal.                           A few small-mouthed diverticula were found in the                            sigmoid colon.                           Anal papilla(e) were hypertrophied.                           The terminal ileum appeared normal. Complications:            No immediate  complications. Estimated Blood Loss:     Estimated blood loss was minimal. Impression:               - Two 3 to 5 mm polyps in the sigmoid colon and in                            the transverse colon, removed with a cold snare.                            Resected and retrieved.                           - Six 2 to 3 mm polyps in the rectum and at the                            recto-sigmoid colon, removed with a cold snare.                            Resected and retrieved.                           - Diverticulosis in the sigmoid colon.                           - Anal papilla(e) were hypertrophied.                           - The examined portion of the ileum was normal. Recommendation:           - Patient has a contact number available for                            emergencies. The signs and symptoms of potential                            delayed complications were discussed with the                            patient. Return to normal activities tomorrow.                            Written discharge instructions were provided to the                            patient.                           - Resume previous diet.                           - Continue present medications.                           - Await pathology results.                           -  Repeat colonoscopy for surveillance based on                            pathology results.                           - Return to GI office PRN. Doristine Locks, MD 10/16/2023 9:25:41 AM

## 2023-10-16 NOTE — Patient Instructions (Addendum)
Resume previous diet and medications.  Handout provided on polyps.  Follow up colonoscopy to be based on biopsy results.    YOU HAD AN ENDOSCOPIC PROCEDURE TODAY AT THE [facility]:   Refer to the procedure report that was given to you for any specific questions about what was found during the examination.  If the procedure report does not answer your questions, please call your gastroenterologist to clarify.  If you requested that your care partner not be given the details of your procedure findings, then the procedure report has been included in a sealed envelope for you to review at your convenience later.  YOU SHOULD EXPECT: Some feelings of bloating in the abdomen. Passage of more gas than usual.  Walking can help get rid of the air that was put into your GI tract during the procedure and reduce the bloating. If you had a lower endoscopy (such as a colonoscopy or flexible sigmoidoscopy) you may notice spotting of blood in your stool or on the toilet paper. If you underwent a bowel prep for your procedure, you may not have a normal bowel movement for a few days.  Please Note:  You might notice some irritation and congestion in your nose or some drainage.  This is from the oxygen used during your procedure.  There is no need for concern and it should clear up in a day or so.  SYMPTOMS TO REPORT IMMEDIATELY:  Following lower endoscopy (colonoscopy or flexible sigmoidoscopy):  Excessive amounts of blood in the stool  Significant tenderness or worsening of abdominal pains  Swelling of the abdomen that is new, acute  Fever of 100F or higher  For urgent or emergent issues, a gastroenterologist can be reached at any hour by calling [phone]. Do not use MyChart messaging for urgent concerns.    DIET:  We do recommend a small meal at first, but then you may proceed to your regular diet.  Drink plenty of fluids but you should avoid alcoholic beverages for 24 hours.  ACTIVITY:  You  should plan to take it easy for the rest of today and you should NOT DRIVE or use heavy machinery until tomorrow (because of the sedation medicines used during the test).    FOLLOW UP: Our staff will call the number listed on your records the next business day following your procedure.  We will call around 7:15- 8:00 am to check on you and address any questions or concerns that you may have regarding the information given to you following your procedure. If we do not reach you, we will leave a message.     If any biopsies were taken you will be contacted by phone or by letter within the next 1-3 weeks.  Please call us at [phone] if you have not heard about the biopsies in 3 weeks.    SIGNATURES/CONFIDENTIALITY: You and/or your care partner have signed paperwork which will be entered into your electronic medical record.  These signatures attest to the fact that that the information above on your After Visit Summary has been reviewed and is understood.  Full responsibility of the confidentiality of this discharge information lies with you and/or your care-partner.

## 2023-10-18 ENCOUNTER — Telehealth: Payer: Self-pay

## 2023-10-18 ENCOUNTER — Encounter: Payer: Self-pay | Admitting: Gastroenterology

## 2023-10-18 ENCOUNTER — Ambulatory Visit: Payer: Medicare Other | Admitting: Cardiology

## 2023-10-18 LAB — SURGICAL PATHOLOGY

## 2023-10-18 NOTE — Telephone Encounter (Signed)
Attempted f/u call. No answer, left VM. 

## 2023-10-23 ENCOUNTER — Telehealth: Payer: Self-pay

## 2023-10-23 ENCOUNTER — Ambulatory Visit (HOSPITAL_COMMUNITY)
Admission: RE | Admit: 2023-10-23 | Discharge: 2023-10-23 | Disposition: A | Discharge status: Home / Self Care | Payer: Medicare Other | Source: Ambulatory Visit | Attending: Cardiovascular Disease | Admitting: Cardiovascular Disease

## 2023-10-23 DIAGNOSIS — I712 Thoracic aortic aneurysm, without rupture, unspecified: Secondary | ICD-10-CM | POA: Diagnosis not present

## 2023-10-23 DIAGNOSIS — I7121 Aneurysm of the ascending aorta, without rupture: Secondary | ICD-10-CM | POA: Diagnosis not present

## 2023-10-23 DIAGNOSIS — I517 Cardiomegaly: Secondary | ICD-10-CM | POA: Diagnosis not present

## 2023-10-23 DIAGNOSIS — I771 Stricture of artery: Secondary | ICD-10-CM | POA: Diagnosis not present

## 2023-10-23 MED ORDER — GADOBUTROL 1 MMOL/ML IV SOLN
9.0000 mL | Freq: Once | INTRAVENOUS | Status: AC | PRN
  Administered 2023-10-23: 9 mL via INTRAVENOUS

## 2023-10-23 NOTE — Telephone Encounter (Signed)
I called pt in regards to his appointment with Waynetta Sandy, NP in March 2025. I received a paper from [employer] about setting up his cpap and they are requesting a f/u appointment to set this up. Pt verbalized understanding, NFN

## 2023-10-25 ENCOUNTER — Ambulatory Visit: Payer: Medicare Other | Admitting: Primary Care

## 2023-11-06 ENCOUNTER — Other Ambulatory Visit: Payer: Self-pay | Admitting: Internal Medicine

## 2023-11-06 DIAGNOSIS — I2510 Atherosclerotic heart disease of native coronary artery without angina pectoris: Secondary | ICD-10-CM

## 2023-11-06 DIAGNOSIS — I10 Essential (primary) hypertension: Secondary | ICD-10-CM

## 2023-11-07 ENCOUNTER — Other Ambulatory Visit: Payer: Self-pay | Admitting: *Deleted

## 2023-11-07 DIAGNOSIS — I77810 Thoracic aortic ectasia: Secondary | ICD-10-CM

## 2023-11-07 NOTE — Progress Notes (Signed)
Result Note Stable mild dilation of the ascending aorta at 4.1 cm. Repeat scan in one year. cdm

## 2023-11-08 NOTE — Progress Notes (Unsigned)
  Electrophysiology Office Follow up Visit Note:    Date:  11/09/2023   ID:  Gregory Hale, DOB 04-13-1951, MRN [id number]  PCP:  Etta Grandchild, MD  [facility] Cardiologist:  Verne Carrow, MD  [facility] Electrophysiologist:  Lanier Prude, MD    Interval History:     Gregory Hale is a 73 y.o. male who presents for a follow up visit.   I last saw the patient in March 29, 2023.  The patient has a history of tachybradycardia syndrome, nocturnal bradycardia, nocturnal high degree AV block.  He has intact chronotropic competence.  At the last appointment we discussed the pathophysiology of his nocturnal bradycardia.  We discussed treatment options including watchful waiting versus proceeding with a permanent pacemaker.  After our discussion, the patient elected to proceed with a watchful waiting approach.  Dr. Clifton James saw the patient August 28, 2023.  At the appointment with Dr. Clifton James, the patient reported no symptoms of dizziness, near-syncope or syncope.  No shortness of breath or chest pain.  He is doing well today.  He started CPAP about 2 weeks ago and is tolerating the mask.       Past medical, surgical, social and family history were reviewed.  ROS:   Please see the history of present illness.    All other systems reviewed and are negative.  EKGs/Labs/Other Studies Reviewed:    The following studies were reviewed today:          Physical Exam:    VS:  BP (!) 140/82   Pulse (!) 58   Ht 5\' 9"  (1.753 m)   Wt 201 lb 9.6 oz (91.4 kg)   SpO2 97%   BMI 29.77 kg/m     Wt Readings from Last 3 Encounters:  11/09/23 201 lb 9.6 oz (91.4 kg)  10/16/23 198 lb (89.8 kg)  08/28/23 191 lb 12.8 oz (87 kg)     GEN: no distress CARD: RRR, No MRG RESP: No IWOB. CTAB.      ASSESSMENT:    1. Tachycardia-bradycardia syndrome (HCC)   2. Bradycardia, drug induced   3. Heart block AV complete (HCC)    PLAN:    In order of problems listed  above:  #Nocturnal bradycardia #Nocturnal AV block Asymptomatic.  No symptoms of conduction system disease.  No presyncope or syncope.  We discussed again the pathophysiology of his nocturnal bradycardia.    For now, no indication for permanent pacing.  #Coronary disease No ischemic symptoms today.  Continue metoprolol.  Continue aspirin.   Follow-up as needed.  Continue routine follow-up with primary care and Dr. Clifton James.   Signed, Steffanie Dunn, MD, [facility], [facility] 11/09/2023 10:46 AM    Electrophysiology [facility]

## 2023-11-09 ENCOUNTER — Encounter: Payer: Self-pay | Admitting: Cardiology

## 2023-11-09 ENCOUNTER — Ambulatory Visit: Payer: Medicare Other | Attending: Cardiology | Admitting: Cardiology

## 2023-11-09 VITALS — BP 140/82 | HR 58 | Ht 69.0 in | Wt 201.6 lb

## 2023-11-09 DIAGNOSIS — T50905D Adverse effect of unspecified drugs, medicaments and biological substances, subsequent encounter: Secondary | ICD-10-CM

## 2023-11-09 DIAGNOSIS — R001 Bradycardia, unspecified: Secondary | ICD-10-CM | POA: Insufficient documentation

## 2023-11-09 DIAGNOSIS — T50905A Adverse effect of unspecified drugs, medicaments and biological substances, initial encounter: Secondary | ICD-10-CM | POA: Diagnosis not present

## 2023-11-09 DIAGNOSIS — I442 Atrioventricular block, complete: Secondary | ICD-10-CM | POA: Insufficient documentation

## 2023-11-09 DIAGNOSIS — I495 Sick sinus syndrome: Secondary | ICD-10-CM | POA: Diagnosis not present

## 2023-11-09 NOTE — Patient Instructions (Signed)
Medication Instructions:  Your physician recommends that you continue on your current medications as directed. Please refer to the Current Medication list given to you today.  *If you need a refill on your cardiac medications before your next appointment, please call your pharmacy*  Follow-Up: At [facility], you and your health needs are our priority.  As part of our continuing mission to provide you with exceptional heart care, we have created designated Provider Care Teams.  These Care Teams include your primary Cardiologist (physician) and Advanced Practice Providers (APPs -  Physician Assistants and Nurse Practitioners) who all work together to provide you with the care you need, when you need it.  Your next appointment:   Follow up with EP as needed

## 2023-11-24 ENCOUNTER — Encounter: Payer: Self-pay | Admitting: Internal Medicine

## 2023-11-24 NOTE — Telephone Encounter (Signed)
 Patient has already been scheduled

## 2023-12-04 ENCOUNTER — Telehealth: Payer: Self-pay | Admitting: Primary Care

## 2023-12-04 ENCOUNTER — Encounter: Payer: Self-pay | Admitting: Primary Care

## 2023-12-04 ENCOUNTER — Ambulatory Visit: Payer: Medicare Other | Admitting: Primary Care

## 2023-12-04 VITALS — BP 132/74 | HR 52 | Temp 97.8°F | Ht 69.0 in | Wt 201.2 lb

## 2023-12-04 DIAGNOSIS — G4733 Obstructive sleep apnea (adult) (pediatric): Secondary | ICD-10-CM

## 2023-12-04 DIAGNOSIS — G4730 Sleep apnea, unspecified: Secondary | ICD-10-CM

## 2023-12-04 DIAGNOSIS — R001 Bradycardia, unspecified: Secondary | ICD-10-CM | POA: Diagnosis not present

## 2023-12-04 NOTE — Patient Instructions (Addendum)
-  SEVERE OBSTRUCTIVE SLEEP APNEA (OSA): Severe Obstructive Sleep Apnea is a condition where your airway becomes blocked during sleep, causing breathing pauses. Your CPAP therapy has significantly improved your condition, reducing your apneic events from 55 to 6.9 per hour. We will adjust your CPAP pressure settings to 10-20 cm H2O and order an overnight oximetry test to monitor your oxygen levels and heart rate during sleep.  -BRADYCARDIA: Bradycardia is a slower than normal heart rate. You have not experienced any recent symptoms like fainting or dizziness. We discussed the potential link between your bradycardia and sleep apnea, as well as the role of your medication, Metoprolol. If your slow heart rate continues despite better sleep apnea control, we may consider reducing your Metoprolol dose in consultation with your cardiologist.  -CPAP EQUIPMENT: You mentioned dissatisfaction with your current medical supply store for CPAP equipment. We discussed looking into the possibility of changing your medical supply provider.  Orders: Change CPAP pressure 10-20cm h20 Overnight oximetry test on CPAP   INSTRUCTIONS:  We will adjust your CPAP pressure settings and order an overnight oximetry test. If your bradycardia persists, we may need to consult with your cardiologist about adjusting your Metoprolol dose. Additionally, we will explore options for changing your CPAP equipment provider.  Follow-up 6 months with Waynetta Sandy Np

## 2023-12-04 NOTE — Progress Notes (Signed)
 @Patient  ID: Gregory Hale, male    DOB: 01/19/1951, 73 y.o.   MRN: [id number]  Chief Complaint  Patient presents with   Follow-up    Referring provider: Etta Grandchild, MD  HPI: 73 year old male, former smoker.  Past medical history significant for hypertension, ascending aorta dilation, STEMI s/p DES, hypothyroidism, osteoarthritis, chronic back pain.  Previous [facility] pulmonary encounter:  05/05/2023 Patient presents today for sleep consult. Referred by cardiology due to asymptomatic bradycardia. He had a sleep study over 10+ year ago, results are not available. He was told that he had mild sleep apnea. He was never started on CPAP. He does have snoring symptoms, otherwise sleeps ok. He goes to bed between 8:30-9 PM.  It takes him 15 to 30 minutes to fall asleep.  He wakes up on average 1-2 times a night.  He starts his day between 5 and 6 AM. Denies significant daytime sleepiness. Family member has seen him fall asleep during the day when inactive. No concern for narcolepsy or cataplexy. Epworth score 2/24.     06/16/2023 Discussed the use of AI scribe software for clinical note transcription with the patient, who gave verbal consent to proceed.  History of Present Illness   The patient, with severe sleep apnea and bradycardia, presents for follow-up. He was referred by cardiology due to bradycardia.  He underwent a sleep study in August 2024, which revealed severe sleep apnea with 55 apneic events per hour and oxygen desaturation to 73%. Due to the severity of the sleep apnea and its potential impact on his heart, he was started on CPAP therapy.  He is currently using CPAP 100% of the time, averaging 7 hours and 39 minutes per night. The CPAP settings are on auto, ranging from 5 to 20 cm H2O, with a median pressure of 11 to 15 cm H2O. His apnea-hypopnea index (AHI) has improved from 55 to 6.9 events per hour, indicating a reduction from severe to mild sleep apnea. His wife has noticed a  reduction in his snoring, although he did not have complaints about his sleep prior to CPAP use.  No syncopal episodes, fainting, or dizziness. No symptoms of conduction system disease or presyncope. He last saw cardiology in February and was informed of nocturnal bradycardia and AV block, which were asymptomatic.  He is on metoprolol following a myocardial infarction nearly seven years ago, which was prescribed to slow his heart rate. He is confused about the need to manage his heart rate given his current treatment for sleep apnea.      Airview download 11/01/23- 11/30/23 Usage 30/30 days (100%) Average usage 7 hours 39 mins Pressure 5-20cm h20 (13.8cm h20-95%)  Airleaks 9.1L/min (95%) AHI 6.9    Allergies  Allergen Reactions   Advil [Ibuprofen] Other (See Comments)    bleeding    Immunization History  Administered Date(s) Administered   Fluad Quad(high Dose 65+) 06/06/2019   Influenza, High Dose Seasonal PF 06/28/2017, 07/03/2018, 08/18/2023   Influenza,inj,Quad PF,6+ Mos 07/20/2015   Influenza-Unspecified 07/21/2021, 06/26/2022   PFIZER(Purple Top)SARS-COV-2 Vaccination 10/14/2019, 11/08/2019, 06/21/2020   Pneumococcal Conjugate-13 01/29/2018   Pneumococcal Polysaccharide-23 03/18/2019   Td 09/26/2008   Tdap 12/11/2018   Zoster Recombinant(Shingrix) 06/09/2018, 07/30/2018   Zoster, Live 07/30/2015    Past Medical History:  Diagnosis Date   Arthritis    Ascending aorta dilatation (HCC)    Bradycardia    a. HR 40 in setting of acute inferior STEMI 03/2017.   CAD in native artery  a. inf STEMI 03/2017 a/w hypotension and bradycardia -> s/p DES to RCA, otherwise mild disease in LAD, moderate disease in ostium of the moderate caliber diagonal branch, normal LVEF >65%   Dilated aortic root (HCC)    Heart attack (HCC)    Hyperlipidemia    Hypertension    Sleep apnea    STEMI involving right coronary artery (HCC)    04/22/17 PCI/DES to mRCA, normal EF   Tobacco abuse     Quit in 1998    Tobacco History: Social History   Tobacco Use  Smoking Status Former   Current packs/day: 0.00   Types: Cigarettes   Quit date: 1990   Years since quitting: 35.2  Smokeless Tobacco Never   Counseling given: Not Answered   Outpatient Medications Prior to Visit  Medication Sig Dispense Refill   amLODipine (NORVASC) 5 MG tablet TAKE 1 TABLET BY MOUTH DAILY 90 tablet 0   aspirin EC 81 MG tablet Take 1 tablet (81 mg total) by mouth daily. Swallow whole. 90 tablet 3   atorvastatin (LIPITOR) 80 MG tablet Take 1 tablet (80 mg total) by mouth every evening. 90 tablet 1   celecoxib (CELEBREX) 100 MG capsule Take 1 capsule (100 mg total) by mouth 2 (two) times daily. 180 capsule 0   famotidine (PEPCID) 10 MG tablet Take 10 mg by mouth every other day.     irbesartan (AVAPRO) 300 MG tablet TAKE 1 TABLET BY MOUTH DAILY 90 tablet 0   metoprolol succinate (TOPROL-XL) 25 MG 24 hr tablet Take 6.25 mg by mouth daily.     nitroGLYCERIN (NITROSTAT) 0.4 MG SL tablet Place 1 tablet (0.4 mg total) under the tongue every 5 (five) minutes as needed. 25 tablet 3   No facility-administered medications prior to visit.   Review of Systems  Review of Systems  Constitutional: Negative.   Respiratory: Negative.    Cardiovascular: Negative.    Physical Exam  BP 132/74 (BP Location: Left Arm, Patient Position: Sitting, Cuff Size: Large)   Pulse (!) 52   Temp 97.8 F (36.6 C) (Temporal)   Ht 5\' 9"  (1.753 m)   Wt 201 lb 3.2 oz (91.3 kg)   SpO2 96%   BMI 29.71 kg/m  Physical Exam Constitutional:      Appearance: Normal appearance.  HENT:     Head: Normocephalic and atraumatic.  Cardiovascular:     Rate and Rhythm: Regular rhythm. Bradycardia present.  Pulmonary:     Effort: Pulmonary effort is normal.     Breath sounds: Normal breath sounds.  Skin:    General: Skin is warm and dry.  Neurological:     General: No focal deficit present.     Mental Status: He is alert and  oriented to person, place, and time. Mental status is at baseline.  Psychiatric:        Mood and Affect: Mood normal.        Behavior: Behavior normal.        Thought Content: Thought content normal.        Judgment: Judgment normal.      Lab Results:  CBC    Component Value Date/Time   WBC 8.1 07/27/2023 1043   RBC 4.63 07/27/2023 1043   HGB 14.1 07/27/2023 1043   HCT 43.1 07/27/2023 1043   HCT 46 06/24/2011 0000   PLT 179 07/27/2023 1043   PLT 154 06/24/2011 0000   MCV 93.1 07/27/2023 1043   MCH 30.5 07/27/2023 1043   MCHC  32.7 07/27/2023 1043   RDW 13.1 07/27/2023 1043   RDW 13.4 06/24/2011 0000   LYMPHSABS 1.7 03/16/2023 0945   MONOABS 0.7 03/16/2023 0945   EOSABS 0.1 03/16/2023 0945   BASOSABS 0.0 03/16/2023 0945    BMET    Component Value Date/Time   NA 137 07/27/2023 1043   NA 139 08/09/2021 1046   K 4.0 07/27/2023 1043   K 5.2 06/24/2011 0000   CL 101 07/27/2023 1043   CL 103 06/24/2011 0000   CO2 23 07/27/2023 1043   GLUCOSE 99 07/27/2023 1043   BUN 11 07/27/2023 1043   BUN 12 08/09/2021 1046   CREATININE 0.99 07/27/2023 1043   CREATININE 1.08 06/24/2011 0000   CALCIUM 9.0 07/27/2023 1043   CALCIUM 9.5 06/24/2011 0000   GFRNONAA >60 07/27/2023 1043   GFRAA 88 07/20/2020 0938    BNP No results found for: "BNP"  ProBNP No results found for: "PROBNP"  Imaging: No results found.   Assessment & Plan:   No problem-specific Assessment & Plan notes found for this encounter.  1. Severe sleep apnea (Primary) - Ambulatory Referral for DME - Pulse oximetry, overnight; Future     Severe Obstructive Sleep Apnea (OSA) Significant improvement with CPAP therapy, patient went from having 55 events per hour to 6.9 events per hour. Patient reports reduced snoring. No issues with air leaks. Discussed potential adjustment of CPAP pressure to further reduce apneic events. -Adjust CPAP pressure to 10-20cm h20  -Order overnight oximetry test to assess oxygen  levels and heart rate during sleep.  Bradycardia No recent episodes of syncope, dizziness, or other symptoms. Patient saw cardiology follow-up in February. Discussed potential relationship between bradycardia and OSA, and the role of Metoprolol. -If bradycardic episodes persist despite improved OSA control, recommend to cardiology to consider reducing Metoprolol dose.  CPAP Equipment Patient expressed dissatisfaction with current medical supply store. Discussed potential change of provider. -Investigate possibility of changing medical supply store for patient's CPAP equipment.       Glenford Bayley, NP 12/04/2023

## 2023-12-04 NOTE — Telephone Encounter (Signed)
 Patient is unhappy with his experience with [employer] can he change DME companies?

## 2023-12-05 NOTE — Telephone Encounter (Signed)
 CPAP and ONO ordered- requested another DME other than [employer].  Nothing further needed

## 2023-12-05 NOTE — Telephone Encounter (Signed)
 He can we would just need orders for what ever he gets through [employer]

## 2023-12-06 ENCOUNTER — Encounter: Payer: Self-pay | Admitting: Internal Medicine

## 2023-12-07 ENCOUNTER — Encounter: Payer: Self-pay | Admitting: Internal Medicine

## 2023-12-07 ENCOUNTER — Other Ambulatory Visit: Payer: Self-pay

## 2023-12-07 DIAGNOSIS — M545 Low back pain, unspecified: Secondary | ICD-10-CM

## 2023-12-07 DIAGNOSIS — M150 Primary generalized (osteo)arthritis: Secondary | ICD-10-CM

## 2023-12-07 MED ORDER — CELECOXIB 100 MG PO CAPS
100.0000 mg | ORAL_CAPSULE | Freq: Two times a day (BID) | ORAL | 0 refills | Status: DC
Start: 2023-12-07 — End: 2024-01-09

## 2023-12-26 ENCOUNTER — Encounter: Payer: Medicare Other | Admitting: Internal Medicine

## 2023-12-28 ENCOUNTER — Other Ambulatory Visit: Payer: Self-pay | Admitting: Internal Medicine

## 2023-12-28 DIAGNOSIS — I2510 Atherosclerotic heart disease of native coronary artery without angina pectoris: Secondary | ICD-10-CM

## 2023-12-28 DIAGNOSIS — E785 Hyperlipidemia, unspecified: Secondary | ICD-10-CM

## 2024-01-01 DIAGNOSIS — R0902 Hypoxemia: Secondary | ICD-10-CM | POA: Diagnosis not present

## 2024-01-01 DIAGNOSIS — G4730 Sleep apnea, unspecified: Secondary | ICD-10-CM | POA: Diagnosis not present

## 2024-01-04 DIAGNOSIS — G4734 Idiopathic sleep related nonobstructive alveolar hypoventilation: Secondary | ICD-10-CM

## 2024-01-04 DIAGNOSIS — G4730 Sleep apnea, unspecified: Secondary | ICD-10-CM

## 2024-01-04 NOTE — Telephone Encounter (Signed)
 Overnight oximetry test on 01/01/2024 on CPAP showed that patient spent 1 hour 9 minutes with an oxygen level less than 88%.  SpO2 low 80 (baseline 96%)  Patient needs CPAP titration study to assess supplemental oxygen, I have ordered

## 2024-01-05 ENCOUNTER — Other Ambulatory Visit: Payer: Self-pay | Admitting: Internal Medicine

## 2024-01-05 DIAGNOSIS — I10 Essential (primary) hypertension: Secondary | ICD-10-CM

## 2024-01-05 DIAGNOSIS — I2510 Atherosclerotic heart disease of native coronary artery without angina pectoris: Secondary | ICD-10-CM

## 2024-01-05 NOTE — Telephone Encounter (Signed)
 I set that to our scan department so I dont have the report any longer, may take a a few days to upload to chart electronically. He still needs a titration study   If still requesting HR numbers we can have DME re-scan results

## 2024-01-08 ENCOUNTER — Telehealth: Payer: Self-pay

## 2024-01-08 NOTE — Telephone Encounter (Signed)
 Copied from CRM [id number]. Topic: Clinical - Lab/Test Results >> Jan 08, 2024  9:35 AM Crist Dominion wrote: Reason for CRM: Please call patient back to update him on his heart rate numbers result, as he is very upset that this is not readily available and is insistent that he needs the results for his doctors appointment tomorrow. Per the messages in patients chart he was told by CMA Ashlyn Busick that she would have the [facility] team get this for the patient but he has not received anything.  Patient states he would like this also sent to him in MyChart >> Jan 08, 2024  2:45 PM Chantha C wrote: Patient is calling back on test results has not heard from the office yet. Per CAL, warm transferred to CAL.   Spoke with patient regarding prior message . Advised patient that the ONO test result's had been sent to scan and patient wanted to be not nice . Patient stated he had the test done last week and our office has gotten the result's patient wanted to talk to someone .Routing this message to Ashlyn.

## 2024-01-08 NOTE — Telephone Encounter (Deleted)
 Copied from CRM [id number]. Topic: Clinical - Lab/Test Results >> Jan 08, 2024  9:35 AM Crist Dominion wrote: Reason for CRM: Please call patient back to update him on his heart rate numbers result, as he is very upset that this is not readily available and is insistent that he needs the results for his doctors appointment tomorrow. Per the messages in patients chart he was told by CMA Ashlyn Busick that she would have the [facility] team get this for the patient but he has not received anything.  Patient states he would like this also sent to him in MyChart

## 2024-01-08 NOTE — Telephone Encounter (Unsigned)
 Copied from CRM [id number]. Topic: Clinical - Lab/Test Results >> Jan 08, 2024  9:35 AM Gregory Hale wrote: Reason for CRM: Please call patient back to update him on his heart rate numbers result, as he is very upset that this is not readily available and is insistent that he needs the results for his doctors appointment tomorrow. Per the messages in patients chart he was told by CMA Ashlyn Busick that she would have the [facility] team get this for the patient but he has not received anything.  Patient states he would like this also sent to him in MyChart

## 2024-01-09 ENCOUNTER — Ambulatory Visit: Admitting: Internal Medicine

## 2024-01-09 ENCOUNTER — Encounter: Payer: Self-pay | Admitting: Internal Medicine

## 2024-01-09 VITALS — BP 134/86 | HR 58 | Temp 98.3°F | Ht 69.0 in | Wt 198.0 lb

## 2024-01-09 DIAGNOSIS — R3912 Poor urinary stream: Secondary | ICD-10-CM | POA: Diagnosis not present

## 2024-01-09 DIAGNOSIS — R001 Bradycardia, unspecified: Secondary | ICD-10-CM

## 2024-01-09 DIAGNOSIS — I10 Essential (primary) hypertension: Secondary | ICD-10-CM

## 2024-01-09 DIAGNOSIS — M545 Low back pain, unspecified: Secondary | ICD-10-CM | POA: Diagnosis not present

## 2024-01-09 DIAGNOSIS — E039 Hypothyroidism, unspecified: Secondary | ICD-10-CM

## 2024-01-09 DIAGNOSIS — M150 Primary generalized (osteo)arthritis: Secondary | ICD-10-CM

## 2024-01-09 DIAGNOSIS — E785 Hyperlipidemia, unspecified: Secondary | ICD-10-CM | POA: Diagnosis not present

## 2024-01-09 DIAGNOSIS — T50905A Adverse effect of unspecified drugs, medicaments and biological substances, initial encounter: Secondary | ICD-10-CM

## 2024-01-09 DIAGNOSIS — G8929 Other chronic pain: Secondary | ICD-10-CM | POA: Diagnosis not present

## 2024-01-09 DIAGNOSIS — N401 Enlarged prostate with lower urinary tract symptoms: Secondary | ICD-10-CM | POA: Diagnosis not present

## 2024-01-09 DIAGNOSIS — K5904 Chronic idiopathic constipation: Secondary | ICD-10-CM | POA: Diagnosis not present

## 2024-01-09 LAB — HEPATIC FUNCTION PANEL
ALT: 14 U/L (ref 0–53)
AST: 19 U/L (ref 0–37)
Albumin: 4.4 g/dL (ref 3.5–5.2)
Alkaline Phosphatase: 90 U/L (ref 39–117)
Bilirubin, Direct: 0.3 mg/dL (ref 0.0–0.3)
Total Bilirubin: 1.2 mg/dL (ref 0.2–1.2)
Total Protein: 6.6 g/dL (ref 6.0–8.3)

## 2024-01-09 LAB — CBC WITH DIFFERENTIAL/PLATELET
Basophils Absolute: 0 10*3/uL (ref 0.0–0.1)
Basophils Relative: 0.6 % (ref 0.0–3.0)
Eosinophils Absolute: 0.1 10*3/uL (ref 0.0–0.7)
Eosinophils Relative: 1.5 % (ref 0.0–5.0)
HCT: 42.7 % (ref 39.0–52.0)
Hemoglobin: 14 g/dL (ref 13.0–17.0)
Lymphocytes Relative: 26.4 % (ref 12.0–46.0)
Lymphs Abs: 1.7 10*3/uL (ref 0.7–4.0)
MCHC: 32.9 g/dL (ref 30.0–36.0)
MCV: 90.8 fl (ref 78.0–100.0)
Monocytes Absolute: 0.7 10*3/uL (ref 0.1–1.0)
Monocytes Relative: 11 % (ref 3.0–12.0)
Neutro Abs: 3.9 10*3/uL (ref 1.4–7.7)
Neutrophils Relative %: 60.5 % (ref 43.0–77.0)
Platelets: 165 10*3/uL (ref 150.0–400.0)
RBC: 4.7 Mil/uL (ref 4.22–5.81)
RDW: 14.1 % (ref 11.5–15.5)
WBC: 6.5 10*3/uL (ref 4.0–10.5)

## 2024-01-09 LAB — BASIC METABOLIC PANEL WITH GFR
BUN: 17 mg/dL (ref 6–23)
CO2: 31 meq/L (ref 19–32)
Calcium: 9 mg/dL (ref 8.4–10.5)
Chloride: 101 meq/L (ref 96–112)
Creatinine, Ser: 1.01 mg/dL (ref 0.40–1.50)
GFR: 74.72 mL/min (ref >60.00)
Glucose, Bld: 113 mg/dL — ABNORMAL HIGH (ref 70–99)
Potassium: 4.9 meq/L (ref 3.5–5.1)
Sodium: 137 meq/L (ref 135–145)

## 2024-01-09 LAB — TSH: TSH: 3.47 u[IU]/mL (ref 0.35–5.50)

## 2024-01-09 LAB — PSA: PSA: 0.9 ng/mL (ref 0.10–4.00)

## 2024-01-09 LAB — MAGNESIUM: Magnesium: 2.1 mg/dL (ref 1.5–2.5)

## 2024-01-09 MED ORDER — CELECOXIB 100 MG PO CAPS: 100.0000 mg | ORAL_CAPSULE | Freq: Two times a day (BID) | ORAL | 1 refills | Status: DC

## 2024-01-09 NOTE — Progress Notes (Signed)
 Subjective:  Patient ID: Gregory Hale, male    DOB: Feb 12, 1951  Age: 73 y.o. MRN: [id number]  CC: Hypertension and Hyperlipidemia   HPI Gregory Hale presents for f/up ---  Discussed the use of AI scribe software for clinical note transcription with the patient, who gave verbal consent to proceed.  History of Present Illness   Gregory Hale is a 73 year old male with low heart rate and sleep apnea who presents for a follow-up regarding his heart rate and sleep study results.  He feels generally well and is able to engage in activities such as playing golf and doing yard work. His low heart rate does not bother him, but he is concerned about the treatment and lack of feedback regarding his heart rate.  He underwent a three-day study with electrodes which showed a high number of events per hour, leading to the recommendation of a CPAP machine, which he has been using since February. This has reduced the events per hour significantly. An oximeter study was conducted, and the results showed a low of 45, a high of 118, and a baseline of 60. He feels the feedback from the office has been inadequate.  No dizziness or lightheadedness during the day. 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 He is perplexed by the nighttime issues as he feels fine during the day.  He has a history of a heart attack and is currently taking metoprolol  at a dose of 6.25 mg once in the morning. This was part of his regimen post-heart attack and has been reduced from a higher dose two years ago.  He reports manageable back pain that does not radiate into his legs or feet, although he occasionally experiences pain in his right or left leg while sitting or lying down, which resolves with movement.  No bleeding or bruising unless he injures himself, and he has not used nitroglycerin  since his heart attack.  He takes a stool softener daily to prevent constipation, as he experiences  difficulty with bowel movements if he does not take it. He has tried Miralax in the past without success.       Outpatient Medications Prior to Visit  Medication Sig Dispense Refill   amLODipine  (NORVASC ) 5 MG tablet TAKE 1 TABLET BY MOUTH DAILY 90 tablet 0   aspirin  EC 81 MG tablet Take 1 tablet (81 mg total) by mouth daily. Swallow whole. 90 tablet 3   atorvastatin  (LIPITOR ) 80 MG tablet TAKE ONE TABLET BY MOUTH EVERY EVENING 90 tablet 1   famotidine (PEPCID) 10 MG tablet Take 10 mg by mouth every other day.     irbesartan  (AVAPRO ) 300 MG tablet TAKE 1 TABLET BY MOUTH DAILY 90 tablet 0   metoprolol  succinate (TOPROL -XL) 25 MG 24 hr tablet Take 6.25 mg by mouth daily.     nitroGLYCERIN  (NITROSTAT ) 0.4 MG SL tablet Place 1 tablet (0.4 mg total) under the tongue every 5 (five) minutes as needed. 25 tablet 3   celecoxib  (CELEBREX ) 100 MG capsule Take 1 capsule (100 mg total) by mouth 2 (two) times daily. 60 capsule 0   No facility-administered medications prior to visit.    ROS Review of Systems  Gastrointestinal:  Positive for constipation.  Musculoskeletal:  Positive for arthralgias and back pain. Negative for joint swelling and myalgias.    Objective:  BP 134/86 (BP Location: Left Arm, Patient Position: Sitting, Cuff Size: Normal)   Pulse (!) 58   Temp 98.3 F (  36.8 C) (Oral)   Ht 5\' 9"  (1.753 m)   Wt 198 lb (89.8 kg)   SpO2 99%   BMI 29.24 kg/m   BP Readings from Last 3 Encounters:  01/09/24 134/86  12/04/23 132/74  11/09/23 (!) 140/82    Wt Readings from Last 3 Encounters:  01/09/24 198 lb (89.8 kg)  12/04/23 201 lb 3.2 oz (91.3 kg)  11/09/23 201 lb 9.6 oz (91.4 kg)    Physical Exam Vitals reviewed.  Constitutional:      Appearance: Normal appearance.  HENT:     Mouth/Throat:     Mouth: Mucous membranes are moist.  Eyes:     General: No scleral icterus.    Conjunctiva/sclera: Conjunctivae normal.  Cardiovascular:     Rate and Rhythm: Normal rate and regular  rhythm.     Heart sounds: No murmur heard.    No friction rub. No gallop.     Comments: EKG- SR with PAC's, 64 bpm RBBB  No LVH, Q waves, or ST/T wave changes  Pulmonary:     Effort: Pulmonary effort is normal.     Breath sounds: No stridor. No wheezing, rhonchi or rales.  Abdominal:     General: Abdomen is flat.     Palpations: There is no mass.     Tenderness: There is no abdominal tenderness. There is no guarding.     Hernia: No hernia is present. There is no hernia in the left inguinal area or right inguinal area.  Genitourinary:    Pubic Area: No rash.      Penis: Normal and circumcised.      Testes: Normal.     Epididymis:     Right: Normal.     Left: Normal.     Prostate: Enlarged. Not tender and no nodules present.     Rectum: Guaiac result negative. No mass, tenderness, anal fissure, external hemorrhoid or internal hemorrhoid. Normal anal tone.  Musculoskeletal:     Cervical back: Neck supple.     Right lower leg: No edema.     Left lower leg: No edema.  Lymphadenopathy:     Cervical: No cervical adenopathy.     Lower Body: No right inguinal adenopathy. No left inguinal adenopathy.  Skin:    General: Skin is warm and dry.  Neurological:     General: No focal deficit present.     Mental Status: He is alert. Mental status is at baseline.  Psychiatric:        Mood and Affect: Mood normal.        Behavior: Behavior normal.     Lab Results  Component Value Date   WBC 6.5 01/09/2024   HGB 14.0 01/09/2024   HCT 42.7 01/09/2024   PLT 165.0 01/09/2024   GLUCOSE 113 (H) 01/09/2024   CHOL 124 06/12/2023   TRIG 42.0 06/12/2023   HDL 75.70 06/12/2023   LDLCALC 40 06/12/2023   ALT 14 01/09/2024   AST 19 01/09/2024   NA 137 01/09/2024   K 4.9 01/09/2024   CL 101 01/09/2024   CREATININE 1.01 01/09/2024   BUN 17 01/09/2024   CO2 31 01/09/2024   TSH 3.47 01/09/2024   PSA 0.90 01/09/2024   HGBA1C 5.7 08/30/2021    MR Angiogram Chest W Wo Contrast Result Date:  10/28/2023 CLINICAL DATA:  Thoracic aortic aneurysm follow-up EXAM: MRA CHEST WITH OR WITHOUT CONTRAST TECHNIQUE: Angiographic images of the chest were obtained using MRA technique without and with intravenous contrast. CONTRAST:  9  mL GADAVIST  GADOBUTROL  1 MMOL/ML IV SOLN COMPARISON:  Prior MRA chest 08/27/2021 FINDINGS: VASCULAR Aorta: Stable mild fusiform aneurysmal dilation of the ascending thoracic aorta with a maximal diameter of 4.1 cm. No evidence of dissection. Conventional 3 vessel arch anatomy. Elongation of the aortic isthmus and proximal descending thoracic aorta consistent with a type 3 arch. Minimal dilation of the aortic root at 4.5 cm measured at the sinuses of Valsalva. Heart: Normal in size. Mild concentric hypertrophy of the left ventricular myocardium. No pericardial effusion. Pulmonary Arteries:  Normal in caliber. Other: None. NON-VASCULAR No focal signal abnormality or abnormal enhancement within the lungs, pleura, mediastinum, visualized upper abdomen or musculoskeletal soft tissues. IMPRESSION: 1. Stable mild fusiform aneurysmal dilation of the ascending aorta with a maximal diameter of 4.1 cm. Elongated and tortuous descending thoracic aorta. Recommend annual imaging followup by CTA or MRA. This recommendation follows 2010 ACCF/AHA/AATS/ACR/ASA/SCA/SCAI/SIR/STS/SVM Guidelines for the Diagnosis and Management of Patients with Thoracic Aortic Disease. Circulation. 2010; 121: W295-A213. Aortic aneurysm NOS (ICD10-I71.9) 2. Mild concentric hypertrophy of the left ventricular myocardium suggests underlying systemic arterial hypertension. Electronically Signed   By: Fernando Hoyer M.D.   On: 10/28/2023 06:43    Assessment & Plan:   Acquired hypothyroidism- He is euthyroid. -     TSH; Future  Primary osteoarthritis involving multiple joints -     Celecoxib ; Take 1 capsule (100 mg total) by mouth 2 (two) times daily.  Dispense: 180 capsule; Refill: 1  Chronic bilateral low back pain  without sciatica -     Celecoxib ; Take 1 capsule (100 mg total) by mouth 2 (two) times daily.  Dispense: 180 capsule; Refill: 1  Benign prostatic hyperplasia with weak urinary stream -     PSA; Future  Essential hypertension- BP is well controlled. -     CBC with Differential/Platelet; Future -     Basic metabolic panel with GFR; Future -     TSH; Future  Bradycardia, drug induced- He is asx with this. -     TSH; Future -     EKG 12-Lead  Dyslipidemia, goal LDL below 70- LDL goal achieved. Doing well on the statin  -     Hepatic function panel; Future -     TSH; Future  Chronic idiopathic constipation -     Magnesium; Future     Follow-up: Return in about 6 months (around 07/10/2024).  Sandra Crouch, MD

## 2024-01-09 NOTE — Patient Instructions (Signed)

## 2024-01-09 NOTE — Telephone Encounter (Signed)
 I sent pt a Mychart message. Closing this note

## 2024-01-10 NOTE — Telephone Encounter (Signed)
 HR was normal at baseline. He needs a CPAP titration study cause O2 dropped while using CPAP. I believe I already ordered. He need to continue to wear and we may need to add oxygen

## 2024-01-11 ENCOUNTER — Encounter: Payer: Self-pay | Admitting: Primary Care

## 2024-01-19 NOTE — Telephone Encounter (Signed)
 The overnight oximetry test is in his chart under other orders, his doctor will be able to see them.  Overnight oximetry test with CPAP on 01/01/2024 showed patient spent an hour and 9 minutes with an oxygen level less than 88%.  Heart rate baseline was 68/min (high 118, low 5).  Based on his oxygen desaturations I have recommended a CPAP titration study in the lab which we have ordered and needs to be completed for this reason.  His CPAP download from 12/20/2023 through 01/14/2024 shows 93% compliance with usage with an average use 7 hours 45 minutes.  Current pressure AutoSet 10 to 20 cm H2O (14.1 cm H2O-95%) with residual AHI 4.4.  He needs to follow-up with cardiology regarding his heart rate and he needs to complete CPAP titration study like I have recommended and ordered. Nothing further on pulmonary medicines end.

## 2024-01-22 NOTE — Telephone Encounter (Signed)
 That is the reason for the titration study. This is a a formal study to assess correct CPAP settings and determine oxygen need. There is nothing more I can do, I have ordered the appropriate testing which needs to be completed.

## 2024-01-25 NOTE — Telephone Encounter (Signed)
 We have answered his question several times. We ordered the CPAP titration study to determine correct pressure settings and oxygen need. This needs to be completed before changes would be made. Nothing further is recommended.

## 2024-01-29 ENCOUNTER — Other Ambulatory Visit: Payer: Self-pay | Admitting: Internal Medicine

## 2024-01-29 DIAGNOSIS — I10 Essential (primary) hypertension: Secondary | ICD-10-CM

## 2024-01-29 DIAGNOSIS — I2510 Atherosclerotic heart disease of native coronary artery without angina pectoris: Secondary | ICD-10-CM

## 2024-02-21 ENCOUNTER — Encounter (HOSPITAL_BASED_OUTPATIENT_CLINIC_OR_DEPARTMENT_OTHER): Admitting: Internal Medicine

## 2024-04-02 ENCOUNTER — Other Ambulatory Visit: Payer: Self-pay | Admitting: Internal Medicine

## 2024-04-02 DIAGNOSIS — I10 Essential (primary) hypertension: Secondary | ICD-10-CM

## 2024-04-02 DIAGNOSIS — I2510 Atherosclerotic heart disease of native coronary artery without angina pectoris: Secondary | ICD-10-CM

## 2024-04-03 ENCOUNTER — Telehealth: Payer: Self-pay | Admitting: *Deleted

## 2024-04-03 DIAGNOSIS — M79671 Pain in right foot: Secondary | ICD-10-CM | POA: Diagnosis not present

## 2024-04-03 DIAGNOSIS — M2041 Other hammer toe(s) (acquired), right foot: Secondary | ICD-10-CM | POA: Diagnosis not present

## 2024-04-03 DIAGNOSIS — M21611 Bunion of right foot: Secondary | ICD-10-CM | POA: Diagnosis not present

## 2024-04-03 NOTE — Telephone Encounter (Signed)
   Pre-operative Risk Assessment    Patient Name: Gregory Hale  DOB: Oct 07, 1950 MRN: [id number]   Date of last office visit: 11/09/23 DR. LAMBERT Date of next office visit: NONE   Request for Surgical Clearance    Procedure:  RIGHT FOOT BUNION CORRECTION WITH DOUBLE OSTEOTOMY, 2ND METATARSAL WEIL OSTEOTOMY; 2ND HAMMERTOE CORRECTION   Date of Surgery:  Clearance TBD                                Surgeon:  DR. NORLEEN HEWITT Surgeon's Group or Practice Name:  [facility] Phone number:  [phone] MEGAN DAVIS  Fax number:  [phone]   Type of Clearance Requested:   - Medical  - Pharmacy:  Hold Aspirin      Type of Anesthesia:  General    Additional requests/questions:    Gregory Hale   04/03/2024, 4:32 PM

## 2024-04-03 NOTE — Telephone Encounter (Signed)
   Name: Gregory Hale  DOB: 1950/12/30  MRN: [id number]  Primary Cardiologist: Lonni Cash, MD   Preoperative team, please contact this patient and set up a phone call appointment for further preoperative risk assessment. Please obtain consent and complete medication review. Thank you for your help.  I confirm that guidance regarding antiplatelet and oral anticoagulation therapy has been completed and, if necessary, noted below.  Regarding ASA therapy, we recommend continuation of ASA throughout the perioperative period.  However, if the surgeon feels that cessation of ASA is required in the perioperative period, it may be stopped 5-7 days prior to surgery with a plan to resume it as soon as felt to be feasible from a surgical standpoint in the post-operative period.  I also confirmed the patient resides in the state of [place] . As per [facility] Medical Board telemedicine laws, the patient must reside in the state in which the provider is licensed.   Damien JAYSON Braver, NP 04/03/2024, 4:48 PM [facility]

## 2024-04-04 ENCOUNTER — Telehealth: Payer: Self-pay

## 2024-04-04 NOTE — Telephone Encounter (Signed)
  Patient Consent for Virtual Visit        Gregory Hale has provided verbal consent on 04/04/2024 for a virtual visit (video or telephone).   CONSENT FOR VIRTUAL VISIT FOR:  Gregory Hale  By participating in this virtual visit I agree to the following:  I hereby voluntarily request, consent and authorize [facility] and its employed or contracted physicians, physician assistants, nurse practitioners or other licensed health care professionals (the Practitioner), to provide me with telemedicine health care services (the "Services) as deemed necessary by the treating Practitioner. I acknowledge and consent to receive the Services by the Practitioner via telemedicine. I understand that the telemedicine visit will involve communicating with the Practitioner through live audiovisual communication technology and the disclosure of certain medical information by electronic transmission. I acknowledge that I have been given the opportunity to request an in-person assessment or other available alternative prior to the telemedicine visit and am voluntarily participating in the telemedicine visit.  I understand that I have the right to withhold or withdraw my consent to the use of telemedicine in the course of my care at any time, without affecting my right to future care or treatment, and that the Practitioner or I may terminate the telemedicine visit at any time. I understand that I have the right to inspect all information obtained and/or recorded in the course of the telemedicine visit and may receive copies of available information for a reasonable fee.  I understand that some of the potential risks of receiving the Services via telemedicine include:  Delay or interruption in medical evaluation due to technological equipment failure or disruption; Information transmitted may not be sufficient (e.g. poor resolution of images) to allow for appropriate medical decision making by the Practitioner;  and/or  In rare instances, security protocols could fail, causing a breach of personal health information.  Furthermore, I acknowledge that it is my responsibility to provide information about my medical history, conditions and care that is complete and accurate to the best of my ability. I acknowledge that Practitioner's advice, recommendations, and/or decision may be based on factors not within their control, such as incomplete or inaccurate data provided by me or distortions of diagnostic images or specimens that may result from electronic transmissions. I understand that the practice of medicine is not an exact science and that Practitioner makes no warranties or guarantees regarding treatment outcomes. I acknowledge that a copy of this consent can be made available to me via my patient portal [facility] MyChart), or I can request a printed copy by calling the office of  [facility].    I understand that my insurance will be billed for this visit.   I have read or had this consent read to me. I understand the contents of this consent, which adequately explains the benefits and risks of the Services being provided via telemedicine.  I have been provided ample opportunity to ask questions regarding this consent and the Services and have had my questions answered to my satisfaction. I give my informed consent for the services to be provided through the use of telemedicine in my medical care

## 2024-04-04 NOTE — Telephone Encounter (Signed)
 Spoke with patient who is agreeable to do a tele visit on 7/14 at 2:40 pm .Consent done, med rec to be completed due to patient having to get off the phone.

## 2024-04-05 ENCOUNTER — Telehealth: Payer: Self-pay | Admitting: Primary Care

## 2024-04-05 NOTE — Telephone Encounter (Signed)
 Fax received from [facility] to perform a right foot bunion correction with double osteotomy; 2nd hammertoe correction with general anesthesia on patient.  Patient needs surgery clearance. Surgery is pending. Patient was seen on 12/04/23. Office protocol is a risk assessment can be sent to surgeon if patient has been seen in 60 days or less.   Sending to [facility] for risk assessment or recommendations if patient needs to be seen in office prior to surgical procedure.

## 2024-04-08 ENCOUNTER — Ambulatory Visit: Attending: Internal Medicine

## 2024-04-08 DIAGNOSIS — Z01810 Encounter for preprocedural cardiovascular examination: Secondary | ICD-10-CM

## 2024-04-08 NOTE — Progress Notes (Signed)
 Virtual Visit via Telephone Note   Because of Gregory Hale co-morbid illnesses, he is at least at moderate risk for complications without adequate follow up.  This format is felt to be most appropriate for this patient at this time.  Due to technical limitations with video connection (technology), today's appointment will be conducted as an audio only telehealth visit, and Gregory Hale verbally agreed to proceed in this manner.   All issues noted in this document were discussed and addressed.  No physical exam could be performed with this format.  Evaluation Performed:  Preoperative cardiovascular risk assessment _____________   Date:  04/08/2024   Patient ID:  Gregory Hale, DOB [birth year], MRN [id number] Patient Location:  Home Provider location:   Office  Primary Care Provider:  Joshua Debby CROME, MD Primary Cardiologist:  Gregory Cash, MD  Chief Complaint / Patient Profile  73 y.o. y/o male with a h/o CAD, former tobacco abuse, OA and thoracic aortic aneurysm who is pending right foot bunion correction with double osteotomy, second metatarsal weil osteotomy, 2nd hammer toe correction and presents today for telephonic preoperative cardiovascular risk assessment. History of Present Illness  Gregory Hale is a 73 y.o. male who presents via audio/video conferencing for a telehealth visit today.  Pt was last seen in cardiology clinic on 11/09/2023 by Dr. Ole Hale.  At that time Gregory Hale was doing well.  The patient is now pending procedure as outlined above. Since his last visit, he has remained stable from a cardiac standpoint. Today he denies chest pain, shortness of breath, lower extremity edema, fatigue, palpitations, melena, hematuria, hemoptysis, diaphoresis, weakness, presyncope, syncope, orthopnea, and PND. He is able to achieve greater than 4 METs of activity.  Past Medical History    Past Medical History:  Diagnosis Date   Arthritis    Ascending aorta  dilatation (HCC)    Bradycardia    a. HR 40 in setting of acute inferior STEMI 03/2017.   CAD in native artery    a. inf STEMI 03/2017 a/w hypotension and bradycardia -> s/p DES to RCA, otherwise mild disease in LAD, moderate disease in ostium of the moderate caliber diagonal branch, normal LVEF >65%   Dilated aortic root (HCC)    Heart attack (HCC)    Hyperlipidemia    Hypertension    Sleep apnea    STEMI involving right coronary artery (HCC)    04/22/17 PCI/DES to mRCA, normal EF   Tobacco abuse    Quit in 1998   Past Surgical History:  Procedure Laterality Date   CORONARY STENT INTERVENTION N/A 04/22/2017   Procedure: Coronary Stent Intervention;  Surgeon: Hale Gregory BIRCH, MD;  Location: [facility] INVASIVE CV LAB;  Service: Cardiovascular;  Laterality: N/A;   HEMORRHOID SURGERY N/A 07/27/2023   Procedure: EXTERNAL HEMORRHOIDECTOMY;  Surgeon: Sebastian Moles, MD;  Location: [facility] OR;  Service: General;  Laterality: N/A;   LEFT HEART CATH AND CORONARY ANGIOGRAPHY N/A 04/22/2017   Procedure: Left Heart Cath and Coronary Angiography;  Surgeon: Hale Gregory BIRCH, MD;  Location: [facility] INVASIVE CV LAB;  Service: Cardiovascular;  Laterality: N/A;   parotid Left 1980   TONSILLECTOMY     as a child    Allergies  Allergies  Allergen Reactions   Advil [Ibuprofen] Other (See Comments)    bleeding    Home Medications    Prior to Admission medications   Medication Sig Start Date End Date Taking? Authorizing Provider  amLODipine  (NORVASC ) 5 MG tablet TAKE 1 TABLET BY MOUTH  DAILY 04/03/24   Gregory Debby CROME, MD  aspirin  EC 81 MG tablet Take 1 tablet (81 mg total) by mouth daily. Swallow whole. 06/08/20   Verlin Gregory BIRCH, MD  atorvastatin  (LIPITOR ) 80 MG tablet TAKE ONE TABLET BY MOUTH EVERY EVENING 12/28/23   Gregory Debby CROME, MD  celecoxib  (CELEBREX ) 100 MG capsule Take 1 capsule (100 mg total) by mouth 2 (two) times daily. 01/09/24   Gregory Debby CROME, MD  famotidine (PEPCID) 10 MG tablet  Take 10 mg by mouth every other day.    [provider]  irbesartan  (AVAPRO ) 300 MG tablet TAKE 1 TABLET BY MOUTH DAILY 02/05/24   Gregory Debby CROME, MD  metoprolol  succinate (TOPROL -XL) 25 MG 24 hr tablet Take 6.25 mg by mouth daily.    [provider]  nitroGLYCERIN  (NITROSTAT ) 0.4 MG SL tablet Place 1 tablet (0.4 mg total) under the tongue every 5 (five) minutes as needed. 10/04/21   Verlin Gregory BIRCH, MD    Physical Exam  Vital Signs:  Fairy Anton does not have vital signs available for review today. Given telephonic nature of communication, physical exam is limited. AAOx3. NAD. Normal affect.  Speech and respirations are unlabored. Accessory Clinical Findings  None Assessment & Plan    1.  Preoperative Cardiovascular Risk Assessment:  Gregory Hale perioperative risk of a major cardiac event is 0.9% according to the Revised Cardiac Risk Index (RCRI).  Therefore, he is at low risk for perioperative complications.   His functional capacity is good at 7.34 METs according to the Duke Activity Status Index (DASI). Recommendations: According to ACC/AHA guidelines, no further cardiovascular testing needed.  The patient may proceed to surgery at acceptable risk.   Antiplatelet and/or Anticoagulation Recommendations: Regarding ASA therapy, we recommend continuation of ASA throughout the perioperative period.  However, if the surgeon feels that cessation of ASA is required in the perioperative period, it may be stopped 5-7 days prior to surgery with a plan to resume it as soon as felt to be feasible from a surgical standpoint in the post-operative period.    The patient was advised that if he develops new symptoms prior to surgery to contact our office to arrange for a follow-up visit, and he verbalized understanding.  A copy of this note will be routed to requesting surgeon.  Time:   Today, I have spent 11 minutes with the patient with telehealth technology discussing  medical history, symptoms, and management plan.    Isolde Skaff D Avelina Mcclurkin, NP  04/08/2024, 3:05 PM

## 2024-04-10 ENCOUNTER — Other Ambulatory Visit: Payer: Self-pay | Admitting: Cardiology

## 2024-04-12 NOTE — Telephone Encounter (Signed)
 Rx(s) sent to pharmacy electronically.

## 2024-04-15 NOTE — Telephone Encounter (Signed)
 Needs pre-op risk assessment with myself or Candis before foot surgery

## 2024-04-15 NOTE — Telephone Encounter (Signed)
 I called and spoke with the pt and attempted to get him scheduled  He states will have to call back  Will hold in the clearance pool for now

## 2024-05-20 ENCOUNTER — Encounter: Payer: Self-pay | Admitting: Internal Medicine

## 2024-05-20 DIAGNOSIS — Z23 Encounter for immunization: Secondary | ICD-10-CM | POA: Diagnosis not present

## 2024-05-21 ENCOUNTER — Other Ambulatory Visit: Payer: Self-pay

## 2024-05-21 DIAGNOSIS — M150 Primary generalized (osteo)arthritis: Secondary | ICD-10-CM

## 2024-05-21 DIAGNOSIS — M545 Low back pain, unspecified: Secondary | ICD-10-CM

## 2024-05-21 MED ORDER — CELECOXIB 100 MG PO CAPS: 100.0000 mg | ORAL_CAPSULE | Freq: Two times a day (BID) | ORAL | 0 refills | Status: DC

## 2024-05-31 NOTE — Telephone Encounter (Signed)
 Pt has yet to call back and schedule ov for risk assessment  I called him again and there was no answer- I left him a detailed msg that if still needing clearance to call for ov  Closing encounter and will fax note back to [facility] to update them

## 2024-06-24 ENCOUNTER — Other Ambulatory Visit: Payer: Self-pay | Admitting: Internal Medicine

## 2024-06-24 DIAGNOSIS — E785 Hyperlipidemia, unspecified: Secondary | ICD-10-CM

## 2024-06-24 DIAGNOSIS — I2510 Atherosclerotic heart disease of native coronary artery without angina pectoris: Secondary | ICD-10-CM

## 2024-06-27 ENCOUNTER — Other Ambulatory Visit: Payer: Self-pay | Admitting: Internal Medicine

## 2024-06-27 DIAGNOSIS — I10 Essential (primary) hypertension: Secondary | ICD-10-CM

## 2024-06-27 DIAGNOSIS — I2510 Atherosclerotic heart disease of native coronary artery without angina pectoris: Secondary | ICD-10-CM

## 2024-07-01 DIAGNOSIS — H43811 Vitreous degeneration, right eye: Secondary | ICD-10-CM | POA: Diagnosis not present

## 2024-07-01 DIAGNOSIS — H5203 Hypermetropia, bilateral: Secondary | ICD-10-CM | POA: Diagnosis not present

## 2024-07-01 DIAGNOSIS — H52223 Regular astigmatism, bilateral: Secondary | ICD-10-CM | POA: Diagnosis not present

## 2024-07-01 DIAGNOSIS — H353131 Nonexudative age-related macular degeneration, bilateral, early dry stage: Secondary | ICD-10-CM | POA: Diagnosis not present

## 2024-07-01 DIAGNOSIS — H524 Presbyopia: Secondary | ICD-10-CM | POA: Diagnosis not present

## 2024-07-01 DIAGNOSIS — H2513 Age-related nuclear cataract, bilateral: Secondary | ICD-10-CM | POA: Diagnosis not present

## 2024-07-01 DIAGNOSIS — H40023 Open angle with borderline findings, high risk, bilateral: Secondary | ICD-10-CM | POA: Diagnosis not present

## 2024-07-19 DIAGNOSIS — Z23 Encounter for immunization: Secondary | ICD-10-CM | POA: Diagnosis not present

## 2024-08-08 ENCOUNTER — Other Ambulatory Visit: Payer: Self-pay | Admitting: Internal Medicine

## 2024-08-08 DIAGNOSIS — I10 Essential (primary) hypertension: Secondary | ICD-10-CM

## 2024-08-08 DIAGNOSIS — I2510 Atherosclerotic heart disease of native coronary artery without angina pectoris: Secondary | ICD-10-CM

## 2024-09-25 ENCOUNTER — Other Ambulatory Visit: Payer: Self-pay | Admitting: Internal Medicine

## 2024-09-25 DIAGNOSIS — I2510 Atherosclerotic heart disease of native coronary artery without angina pectoris: Secondary | ICD-10-CM

## 2024-09-25 DIAGNOSIS — I10 Essential (primary) hypertension: Secondary | ICD-10-CM

## 2024-09-25 DIAGNOSIS — E785 Hyperlipidemia, unspecified: Secondary | ICD-10-CM

## 2024-10-11 ENCOUNTER — Encounter: Payer: Self-pay | Admitting: Internal Medicine

## 2024-10-14 ENCOUNTER — Telehealth: Payer: Self-pay

## 2024-10-14 ENCOUNTER — Encounter: Payer: Self-pay | Admitting: Internal Medicine

## 2024-10-14 NOTE — Telephone Encounter (Signed)
He is due for an office visit 

## 2024-10-14 NOTE — Telephone Encounter (Signed)
 Copied from CRM [id number]. Topic: Appointments - Scheduling Inquiry for Clinic >> Oct 14, 2024  1:19 PM Sophia H wrote: Reason for CRM: Patient is wanting to schedule in a physical - epic will not let me schedule in a physical, only an AWV and patient does not want that. Please reach out - Monday's are preferred, Patient states can schedule any Monday for April. # [phone]

## 2024-10-14 NOTE — Telephone Encounter (Signed)
 Copied from CRM [id number]. Topic: Clinical - Medication Refill >> Oct 14, 2024  1:12 PM Sophia H wrote: Medication: celecoxib  (CELEBREX ) 100 MG capsule **Patient is wanting to know if PCP will send in 90 day supply with a few fills on file. Doesn't want to have to call office for fills.  Has the patient contacted their pharmacy? Yes, no fills on file.   This is the patient's preferred pharmacy:   [facility] - [place], [place] - [address] [place] [address] Phone: [phone] Fax: [phone]  Is this the correct pharmacy for this prescription? Yes If no, delete pharmacy and type the correct one.   Has the prescription been filled recently? Yes  Is the patient out of the medication? No, has about 2 weeks left.   Has the patient been seen for an appointment in the last year OR does the patient have an upcoming appointment? Seen in April of 2025. Patient is requesting a physical but unable to schedule on my end, will send scheduling request to clinic.   Can we respond through MyChart? Yes  Agent: Please be advised that Rx refills may take up to 3 business days. We ask that you follow-up with your pharmacy.

## 2024-10-15 ENCOUNTER — Other Ambulatory Visit: Payer: Self-pay

## 2024-10-15 ENCOUNTER — Telehealth: Payer: Self-pay

## 2024-10-15 DIAGNOSIS — M150 Primary generalized (osteo)arthritis: Secondary | ICD-10-CM

## 2024-10-15 DIAGNOSIS — G8929 Other chronic pain: Secondary | ICD-10-CM

## 2024-10-15 MED ORDER — CELECOXIB 100 MG PO CAPS: 100.0000 mg | ORAL_CAPSULE | Freq: Two times a day (BID) | ORAL | 0 refills | Status: DC

## 2024-10-15 NOTE — Telephone Encounter (Signed)
 Copied from CRM [id number]. Topic: Clinical - Prescription Issue >> Oct 15, 2024 11:18 AM Mesmerise C wrote: Reason for CRM: Patient states his celecoxib  (CELEBREX ) 100 MG capsule was sent to the wrong pharmacy that medication is always supposed to be sent to [facility] - [place], [place] - [address] [place] [address] Phone: [phone] Fax: [phone] Hours: Not open 24 hours

## 2024-10-15 NOTE — Telephone Encounter (Signed)
He is overdue for an appt.

## 2024-10-16 ENCOUNTER — Other Ambulatory Visit: Payer: Self-pay | Admitting: Internal Medicine

## 2024-10-16 DIAGNOSIS — M150 Primary generalized (osteo)arthritis: Secondary | ICD-10-CM

## 2024-10-16 DIAGNOSIS — G8929 Other chronic pain: Secondary | ICD-10-CM

## 2024-10-16 MED ORDER — CELECOXIB 100 MG PO CAPS: 100.0000 mg | ORAL_CAPSULE | Freq: Two times a day (BID) | ORAL | 0 refills | Status: AC

## 2024-10-16 NOTE — Telephone Encounter (Signed)
Medication has been refilled and sent to the correct pharmacy.

## 2024-10-16 NOTE — Telephone Encounter (Signed)
 Medication has been sent to the correct pharmacy.

## 2024-10-16 NOTE — Telephone Encounter (Signed)
 Patient has been scheduled to see Dr. Joshua in April

## 2024-10-21 ENCOUNTER — Ambulatory Visit (HOSPITAL_COMMUNITY): Admission: RE | Admit: 2024-10-21 | Source: Ambulatory Visit

## 2024-10-22 ENCOUNTER — Telehealth: Payer: Self-pay

## 2024-10-22 NOTE — Telephone Encounter (Signed)
 Copied from CRM #[id number]. Topic: Appointments - Transfer of Care >> Oct 21, 2024  1:18 PM Ismael A wrote: Pt is requesting to transfer FROM: Almarie Ferrari NP Pt is requesting to transfer TO: Dr. Theodoro Reason for requested transfer: patient's preference  It is the responsibility of the team the patient would like to transfer to (Dr. Theodoro ) to reach out to the patient if for any reason this transfer is not acceptable.    Patient has been scheduled with TOC MD as requested

## 2024-10-24 ENCOUNTER — Ambulatory Visit (HOSPITAL_COMMUNITY)
Admission: RE | Admit: 2024-10-24 | Discharge: 2024-10-24 | Disposition: A | Discharge status: Home / Self Care | Source: Ambulatory Visit | Attending: Cardiovascular Disease | Admitting: Cardiovascular Disease

## 2024-10-24 DIAGNOSIS — I77810 Thoracic aortic ectasia: Secondary | ICD-10-CM | POA: Diagnosis present

## 2024-10-24 MED ORDER — GADOBUTROL 1 MMOL/ML IV SOLN
9.0000 mL | Freq: Once | INTRAVENOUS | Status: AC | PRN
  Administered 2024-10-24: 9 mL via INTRAVENOUS

## 2024-10-27 ENCOUNTER — Ambulatory Visit: Payer: Self-pay | Admitting: Cardiovascular Disease

## 2024-11-18 ENCOUNTER — Ambulatory Visit

## 2024-12-30 ENCOUNTER — Ambulatory Visit: Admitting: Internal Medicine
